# Patient Record
Sex: Male | Born: 1950 | Race: White | Hispanic: No | Marital: Married | Smoking: Former smoker
Health system: Southern US, Community
[De-identification: ages and names within clinical notes are randomized; demographics above are authoritative.]

## PROBLEM LIST (undated history)

## (undated) DIAGNOSIS — C73 Malignant neoplasm of thyroid gland: Secondary | ICD-10-CM

## (undated) DIAGNOSIS — I10 Essential (primary) hypertension: Secondary | ICD-10-CM

## (undated) DIAGNOSIS — E119 Type 2 diabetes mellitus without complications: Secondary | ICD-10-CM

## (undated) DIAGNOSIS — C801 Malignant (primary) neoplasm, unspecified: Secondary | ICD-10-CM

## (undated) HISTORY — DX: Malignant neoplasm of thyroid gland: C73

## (undated) HISTORY — PX: THYROID SURGERY: SHX805

## (undated) HISTORY — PX: TRANSURETHRAL RESECTION OF PROSTATE: SHX73

## (undated) HISTORY — PX: BACK SURGERY: SHX140

---

## 2012-02-27 ENCOUNTER — Other Ambulatory Visit (HOSPITAL_COMMUNITY): Payer: Self-pay | Admitting: "Endocrinology

## 2012-02-27 DIAGNOSIS — E049 Nontoxic goiter, unspecified: Secondary | ICD-10-CM

## 2012-03-02 ENCOUNTER — Ambulatory Visit (HOSPITAL_COMMUNITY)
Admission: RE | Admit: 2012-03-02 | Discharge: 2012-03-02 | Disposition: A | Discharge status: Home / Self Care | Payer: BC Managed Care – PPO | Source: Ambulatory Visit | Attending: "Endocrinology | Admitting: "Endocrinology

## 2012-03-02 DIAGNOSIS — E049 Nontoxic goiter, unspecified: Secondary | ICD-10-CM

## 2012-03-05 ENCOUNTER — Other Ambulatory Visit (HOSPITAL_COMMUNITY): Payer: Self-pay | Admitting: "Endocrinology

## 2012-03-05 DIAGNOSIS — E041 Nontoxic single thyroid nodule: Secondary | ICD-10-CM

## 2012-03-19 ENCOUNTER — Other Ambulatory Visit (HOSPITAL_COMMUNITY): Payer: Self-pay | Admitting: "Endocrinology

## 2012-03-19 ENCOUNTER — Ambulatory Visit (HOSPITAL_COMMUNITY)
Admission: RE | Admit: 2012-03-19 | Discharge: 2012-03-19 | Disposition: A | Discharge status: Home / Self Care | Payer: BC Managed Care – PPO | Source: Ambulatory Visit | Attending: "Endocrinology | Admitting: "Endocrinology

## 2012-03-19 DIAGNOSIS — E041 Nontoxic single thyroid nodule: Secondary | ICD-10-CM

## 2012-03-19 DIAGNOSIS — R6889 Other general symptoms and signs: Secondary | ICD-10-CM | POA: Insufficient documentation

## 2012-03-19 DIAGNOSIS — E049 Nontoxic goiter, unspecified: Secondary | ICD-10-CM | POA: Insufficient documentation

## 2012-03-19 NOTE — Progress Notes (Signed)
Lidocaine 2%          8mL injected                         Right thyroid biopsy performed

## 2012-07-08 ENCOUNTER — Other Ambulatory Visit (HOSPITAL_COMMUNITY): Payer: Self-pay | Admitting: "Endocrinology

## 2012-07-08 DIAGNOSIS — C73 Malignant neoplasm of thyroid gland: Secondary | ICD-10-CM

## 2012-07-15 ENCOUNTER — Encounter (HOSPITAL_COMMUNITY): Payer: Self-pay

## 2012-07-15 ENCOUNTER — Encounter (HOSPITAL_COMMUNITY)
Admission: RE | Admit: 2012-07-15 | Discharge: 2012-07-15 | Disposition: A | Discharge status: Home / Self Care | Payer: BC Managed Care – PPO | Source: Ambulatory Visit | Attending: "Endocrinology | Admitting: "Endocrinology

## 2012-07-15 DIAGNOSIS — C73 Malignant neoplasm of thyroid gland: Secondary | ICD-10-CM | POA: Insufficient documentation

## 2012-07-15 HISTORY — DX: Malignant (primary) neoplasm, unspecified: C80.1

## 2012-07-15 HISTORY — DX: Essential (primary) hypertension: I10

## 2012-07-15 HISTORY — DX: Type 2 diabetes mellitus without complications: E11.9

## 2012-07-15 MED ORDER — THYROTROPIN ALFA 1.1 MG IM SOLR
INTRAMUSCULAR | Status: AC
  Administered 2012-07-15: 1.1 mg via INTRAMUSCULAR
  Filled 2012-07-15: qty 0.9

## 2012-07-15 NOTE — Progress Notes (Signed)
Thyrogen 1.1mg /0.29mL IM injection given in RUOQ. Tolerated well.

## 2012-07-16 ENCOUNTER — Encounter (HOSPITAL_COMMUNITY)
Admission: RE | Admit: 2012-07-16 | Discharge: 2012-07-16 | Disposition: A | Discharge status: Home / Self Care | Payer: BC Managed Care – PPO | Source: Ambulatory Visit | Attending: "Endocrinology | Admitting: "Endocrinology

## 2012-07-16 MED ORDER — THYROTROPIN ALFA 1.1 MG IM SOLR
INTRAMUSCULAR | Status: AC
  Administered 2012-07-16: 1.1 mg via INTRAMUSCULAR
  Filled 2012-07-16: qty 0.9

## 2012-07-16 NOTE — Progress Notes (Signed)
Thyrogen 1.1mg /0.67mL IM injection given in LUOQ. Tolerated well.

## 2012-07-17 ENCOUNTER — Encounter (HOSPITAL_COMMUNITY)
Admission: RE | Admit: 2012-07-17 | Discharge: 2012-07-17 | Disposition: A | Discharge status: Home / Self Care | Payer: BC Managed Care – PPO | Source: Ambulatory Visit | Attending: "Endocrinology | Admitting: "Endocrinology

## 2012-07-17 MED ORDER — SODIUM IODIDE I 131 CAPSULE
100.0000 | Freq: Once | INTRAVENOUS | Status: AC | PRN
  Administered 2012-07-17: 106 via ORAL

## 2012-07-27 ENCOUNTER — Encounter (HOSPITAL_COMMUNITY)
Admission: RE | Admit: 2012-07-27 | Discharge: 2012-07-27 | Disposition: A | Discharge status: Home / Self Care | Payer: BC Managed Care – PPO | Source: Ambulatory Visit | Attending: "Endocrinology | Admitting: "Endocrinology

## 2012-07-27 ENCOUNTER — Encounter (HOSPITAL_COMMUNITY): Payer: Self-pay

## 2012-07-27 DIAGNOSIS — C73 Malignant neoplasm of thyroid gland: Secondary | ICD-10-CM

## 2012-12-28 ENCOUNTER — Other Ambulatory Visit (HOSPITAL_COMMUNITY): Payer: Self-pay | Admitting: "Endocrinology

## 2012-12-28 DIAGNOSIS — C73 Malignant neoplasm of thyroid gland: Secondary | ICD-10-CM

## 2012-12-30 ENCOUNTER — Ambulatory Visit (HOSPITAL_COMMUNITY): Payer: BC Managed Care – PPO

## 2012-12-31 ENCOUNTER — Ambulatory Visit (HOSPITAL_COMMUNITY)
Admission: RE | Admit: 2012-12-31 | Discharge: 2012-12-31 | Disposition: A | Discharge status: Home / Self Care | Payer: BC Managed Care – PPO | Source: Ambulatory Visit | Attending: "Endocrinology | Admitting: "Endocrinology

## 2012-12-31 DIAGNOSIS — C73 Malignant neoplasm of thyroid gland: Secondary | ICD-10-CM | POA: Insufficient documentation

## 2012-12-31 DIAGNOSIS — Z09 Encounter for follow-up examination after completed treatment for conditions other than malignant neoplasm: Secondary | ICD-10-CM | POA: Insufficient documentation

## 2013-08-10 ENCOUNTER — Other Ambulatory Visit (HOSPITAL_COMMUNITY): Payer: Self-pay | Admitting: "Endocrinology

## 2013-08-10 DIAGNOSIS — C73 Malignant neoplasm of thyroid gland: Secondary | ICD-10-CM

## 2013-10-19 MED ORDER — THYROTROPIN ALFA 1.1 MG IM SOLR: 0.9000 mg | INTRAMUSCULAR | Status: AC

## 2013-10-20 ENCOUNTER — Encounter (HOSPITAL_COMMUNITY)
Admission: RE | Admit: 2013-10-20 | Discharge: 2013-10-20 | Disposition: A | Discharge status: Home / Self Care | Payer: BC Managed Care – PPO | Source: Ambulatory Visit | Attending: "Endocrinology | Admitting: "Endocrinology

## 2013-10-20 ENCOUNTER — Encounter (HOSPITAL_COMMUNITY): Payer: Self-pay

## 2013-10-20 DIAGNOSIS — E0789 Other specified disorders of thyroid: Secondary | ICD-10-CM | POA: Insufficient documentation

## 2013-10-20 DIAGNOSIS — C73 Malignant neoplasm of thyroid gland: Secondary | ICD-10-CM | POA: Insufficient documentation

## 2013-10-20 MED ORDER — THYROTROPIN ALFA 1.1 MG IM SOLR
INTRAMUSCULAR | Status: AC
  Administered 2013-10-20: 0.9 mg via INTRAMUSCULAR
  Filled 2013-10-20: qty 0.9

## 2013-10-21 ENCOUNTER — Encounter (HOSPITAL_COMMUNITY)
Admission: RE | Admit: 2013-10-21 | Discharge: 2013-10-21 | Disposition: A | Discharge status: Home / Self Care | Payer: BC Managed Care – PPO | Source: Ambulatory Visit | Attending: "Endocrinology | Admitting: "Endocrinology

## 2013-10-21 DIAGNOSIS — C73 Malignant neoplasm of thyroid gland: Secondary | ICD-10-CM

## 2013-10-21 MED ORDER — THYROTROPIN ALFA 1.1 MG IM SOLR
0.9000 mg | INTRAMUSCULAR | Status: AC
  Administered 2013-10-21: 0.9 mg via INTRAMUSCULAR

## 2013-10-22 ENCOUNTER — Encounter (HOSPITAL_COMMUNITY)
Admission: RE | Admit: 2013-10-22 | Discharge: 2013-10-22 | Disposition: A | Discharge status: Home / Self Care | Payer: BC Managed Care – PPO | Source: Ambulatory Visit | Attending: "Endocrinology | Admitting: "Endocrinology

## 2013-10-22 ENCOUNTER — Encounter (HOSPITAL_COMMUNITY): Payer: Self-pay

## 2013-10-22 MED ORDER — SODIUM IODIDE I 131 CAPSULE
4.0000 | Freq: Once | INTRAVENOUS | Status: AC | PRN
  Administered 2013-10-22: 4 via ORAL

## 2013-10-25 ENCOUNTER — Encounter (HOSPITAL_COMMUNITY)
Admission: RE | Admit: 2013-10-25 | Discharge: 2013-10-25 | Disposition: A | Discharge status: Home / Self Care | Payer: BC Managed Care – PPO | Source: Ambulatory Visit | Attending: "Endocrinology | Admitting: "Endocrinology

## 2014-10-07 ENCOUNTER — Other Ambulatory Visit (HOSPITAL_COMMUNITY): Payer: Self-pay | Admitting: "Endocrinology

## 2014-10-07 DIAGNOSIS — C73 Malignant neoplasm of thyroid gland: Secondary | ICD-10-CM

## 2014-10-11 ENCOUNTER — Ambulatory Visit (HOSPITAL_COMMUNITY)
Admission: RE | Admit: 2014-10-11 | Discharge: 2014-10-11 | Disposition: A | Discharge status: Home / Self Care | Payer: BLUE CROSS/BLUE SHIELD | Source: Ambulatory Visit | Attending: "Endocrinology | Admitting: "Endocrinology

## 2014-10-11 DIAGNOSIS — Z85850 Personal history of malignant neoplasm of thyroid: Secondary | ICD-10-CM | POA: Insufficient documentation

## 2014-10-11 DIAGNOSIS — Z9889 Other specified postprocedural states: Secondary | ICD-10-CM | POA: Insufficient documentation

## 2014-10-11 DIAGNOSIS — C73 Malignant neoplasm of thyroid gland: Secondary | ICD-10-CM

## 2015-04-03 ENCOUNTER — Other Ambulatory Visit: Payer: Self-pay | Admitting: "Endocrinology

## 2015-04-03 DIAGNOSIS — C73 Malignant neoplasm of thyroid gland: Secondary | ICD-10-CM

## 2015-04-18 ENCOUNTER — Ambulatory Visit (HOSPITAL_COMMUNITY)
Admission: RE | Admit: 2015-04-18 | Discharge: 2015-04-18 | Disposition: A | Discharge status: Home / Self Care | Payer: BLUE CROSS/BLUE SHIELD | Source: Ambulatory Visit | Attending: "Endocrinology | Admitting: "Endocrinology

## 2015-04-18 DIAGNOSIS — R938 Abnormal findings on diagnostic imaging of other specified body structures: Secondary | ICD-10-CM | POA: Diagnosis not present

## 2015-04-18 DIAGNOSIS — C73 Malignant neoplasm of thyroid gland: Secondary | ICD-10-CM | POA: Diagnosis not present

## 2015-04-18 DIAGNOSIS — Z08 Encounter for follow-up examination after completed treatment for malignant neoplasm: Secondary | ICD-10-CM | POA: Insufficient documentation

## 2015-04-18 DIAGNOSIS — E890 Postprocedural hypothyroidism: Secondary | ICD-10-CM | POA: Insufficient documentation

## 2015-05-26 ENCOUNTER — Other Ambulatory Visit: Payer: Self-pay | Admitting: "Endocrinology

## 2015-05-30 ENCOUNTER — Ambulatory Visit (INDEPENDENT_AMBULATORY_CARE_PROVIDER_SITE_OTHER): Payer: BLUE CROSS/BLUE SHIELD | Admitting: "Endocrinology

## 2015-05-30 ENCOUNTER — Encounter: Payer: Self-pay | Admitting: "Endocrinology

## 2015-05-30 VITALS — BP 148/88 | HR 83 | Ht 68.0 in | Wt 201.0 lb

## 2015-05-30 DIAGNOSIS — E1165 Type 2 diabetes mellitus with hyperglycemia: Secondary | ICD-10-CM

## 2015-05-30 DIAGNOSIS — E118 Type 2 diabetes mellitus with unspecified complications: Secondary | ICD-10-CM

## 2015-05-30 DIAGNOSIS — E782 Mixed hyperlipidemia: Secondary | ICD-10-CM | POA: Insufficient documentation

## 2015-05-30 DIAGNOSIS — E785 Hyperlipidemia, unspecified: Secondary | ICD-10-CM | POA: Diagnosis not present

## 2015-05-30 DIAGNOSIS — I10 Essential (primary) hypertension: Secondary | ICD-10-CM | POA: Diagnosis not present

## 2015-05-30 DIAGNOSIS — C73 Malignant neoplasm of thyroid gland: Secondary | ICD-10-CM

## 2015-05-30 DIAGNOSIS — E890 Postprocedural hypothyroidism: Secondary | ICD-10-CM | POA: Diagnosis not present

## 2015-05-30 DIAGNOSIS — Z794 Long term (current) use of insulin: Secondary | ICD-10-CM | POA: Diagnosis not present

## 2015-05-30 DIAGNOSIS — IMO0002 Reserved for concepts with insufficient information to code with codable children: Secondary | ICD-10-CM | POA: Insufficient documentation

## 2015-05-30 MED ORDER — LEVOTHYROXINE SODIUM 150 MCG PO TABS: 150.0000 ug | ORAL_TABLET | Freq: Every day | ORAL | Status: DC

## 2015-05-30 MED ORDER — SAXAGLIPTIN HCL 5 MG PO TABS: 5.0000 mg | ORAL_TABLET | Freq: Every day | ORAL | Status: DC

## 2015-05-30 MED ORDER — INSULIN DETEMIR 100 UNIT/ML FLEXPEN: 40.0000 [IU] | PEN_INJECTOR | Freq: Every day | SUBCUTANEOUS | Status: DC

## 2015-05-30 MED ORDER — METFORMIN HCL 1000 MG PO TABS: 1000.0000 mg | ORAL_TABLET | Freq: Two times a day (BID) | ORAL | Status: DC

## 2015-05-30 NOTE — Progress Notes (Signed)
Subjective:    Patient ID: Dean Hurley, male    DOB: Sep 06, 1950,    Past Medical History  Diagnosis Date  . Cancer ([place])   . Hypertension   . Diabetes mellitus without complication [facility])    Past Surgical History  Procedure Laterality Date  . Transurethral resection of prostate    . Thyroid surgery      Thyroidectomy   Social History   Social History  . Marital Status: Married    Spouse Name: N/A  . Number of Children: N/A  . Years of Education: N/A   Social History Main Topics  . Smoking status: Former Research scientist (life sciences)  . Smokeless tobacco: Not on file  . Alcohol Use: No  . Drug Use: No  . Sexual Activity: Not on file   Other Topics Concern  . Not on file   Social History Narrative   Outpatient Encounter Prescriptions as of 05/30/2015  Medication Sig  . Insulin Detemir (LEVEMIR FLEXPEN) 100 UNIT/ML Pen Inject 40 Units into the skin daily at 10 pm.  . levothyroxine (SYNTHROID, LEVOTHROID) 150 MCG tablet Take 1 tablet (150 mcg total) by mouth daily before breakfast.  . meclizine (ANTIVERT) 25 MG tablet Take 25 mg by mouth 3 (three) times daily as needed for dizziness.  . metFORMIN (GLUCOPHAGE) 1000 MG tablet Take 1 tablet (1,000 mg total) by mouth 2 (two) times daily with a meal.  . pantoprazole (PROTONIX) 40 MG tablet Take 40 mg by mouth daily.  . rosuvastatin (CRESTOR) 5 MG tablet Take 5 mg by mouth daily.  . saxagliptin HCl (ONGLYZA) 5 MG TABS tablet Take 1 tablet (5 mg total) by mouth daily.  . vitamin B-12 (CYANOCOBALAMIN) 1000 MCG tablet Take 1,000 mcg by mouth daily.  . [DISCONTINUED] Insulin Detemir (LEVEMIR FLEXPEN) 100 UNIT/ML Pen Inject 30 Units into the skin daily at 10 pm.  . [DISCONTINUED] levothyroxine (SYNTHROID, LEVOTHROID) 150 MCG tablet Take 150 mcg by mouth daily before breakfast.  . [DISCONTINUED] metFORMIN (GLUCOPHAGE) 1000 MG tablet Take 1,000 mg by mouth 2 (two) times daily with a meal.  . [DISCONTINUED] ONGLYZA 5 MG TABS tablet TAKE 1 TABLET BY  MOUTH DAILY  . [DISCONTINUED] meloxicam (MOBIC) 15 MG tablet Take 15 mg by mouth daily.   No facility-administered encounter medications on file as of 05/30/2015.   ALLERGIES: Allergies  Allergen Reactions  . Codeine   . Contrast Media [Iodinated Diagnostic Agents]    VACCINATION STATUS:  There is no immunization history on file for this patient.  HPI   64 yr old male who underwent total thyroidectomy in [place] on June 02, 2012. 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 1.7 cm cancer nodule on the right lobe and 0.4 cm cancer nodule on the left lobe. Partial invasion of capsule, margins negative.He underwent thyrogen stimulated thyroid remnant ablation with negative whole body scan for distant metastasis on July 17, 2013.  His May 2014 thyroid /neck ultrasound was negative for any mass lesion.  His thyrogen WBS on 10/25/13 is c/w successful ablation of thyroid remnants with no evidence of local recurrence or metastatic disease. He has another thyroid ultrasound on 10/11/14 which was negative for residual thyroid tissue. On his most recent surveillance thyroid ultrasound on Sep 6,2016 however he has had a 9 mm residual tissue on the  right thyroid bed. He denies neck symptoms. He is here to f/u. He is currently on Synthroid 150 mcg po qam.   He feels better. no new complaints.  He is also being treated for type 2 DM , a1c remains high at 8.4% . He has not been compliant to his Levemir . he did not tolerate Invokana. he brings a log showing above target fasting BG profile. No new complaints.  Review of Systems  Constitutional: no weight gain/loss, no fatigue, no subjective hyperthermia/hypothermia Eyes: no blurry vision, no xerophthalmia ENT: no sore throat, no nodules palpated in throat, no dysphagia/odynophagia, no hoarseness Cardiovascular: no CP/SOB/palpitations/leg swelling Respiratory: no cough/SOB Gastrointestinal: no  N/V/D/C Musculoskeletal: no muscle/joint aches Skin: no rashes Neurological: no tremors/numbness/tingling/dizziness Psychiatric: no depression/anxiety   Objective:    BP 148/88 mmHg  Pulse 83  Ht '5\' 8"'  (1.727 m)  Wt 201 lb (91.173 kg)  BMI 30.57 kg/m2  SpO2 96%  Wt Readings from Last 3 Encounters:  05/30/15 201 lb (91.173 kg)    Physical Exam  Constitutional: overweight, in NAD Eyes: PERRLA, EOMI, no exophthalmos ENT: moist mucous membranes, thyroidectomy scar , no cervical lymphadenopathy Cardiovascular: RRR, No MRG Respiratory: CTA B Gastrointestinal: abdomen soft, NT, ND, BS+ Musculoskeletal: no deformities, strength intact in all 4 Skin: moist, warm, no rashes Neurological: no tremor with outstretched hands, DTR normal in all 4       Assessment & Plan:   1. Thyroid cancer [facility]):  Patient is s/p total thyroidectomy for stage 1 ( T1bNoMx) PTC , multicentric ( 1.7 cm on right lobe and 0.4cms in the left lobe). on July 17, 2012, thyrogen stimulated RAI thyroid remnant ablation given his BRAF positive presentation.  The post therapy scan was negative for distant metastasis. His May 2014 thyroid ultrasound is negative for remnant tissue.  Subsequent surveillance thyrogen stimulated WBS on 10/25/13 was c/w successful ablation of thyroid remnants, with no evidence of local recurrence nor metastatic disease.  His repeat thyroid ultrasound on 10/11/14 showed no residual thyroid tissue. However, on his last surveillance thyroid/neck ultrasound she was found to have a new 8m residual/recurrent soft tissue nodule in the right  thyroidectomy bed.  This could be a recurrent malignancy. We discussed what options to pursue. I will repeat neck/thyroid ultrasoung in 3 months . If the residual tissue continues to grow he will need biopsy to study further. He is a tissue disappears or stays the same will continue with scheduled Thyrogen whole-body scan in 6 -9 month time.  2.  Postsurgical hypothyroidism He is clinically euthyroid and thyroid function tests are  Acceptable. I will continue with levothyroxine 150 g by mouth every morning.  We discussed about correct intake of levothyroxine, at fasting, with water, separated by at least 30 minutes from breakfast, and separated by more than 4 hours from calcium, iron, multivitamins, acid reflux medications (PPIs). -Patient is made aware of the fact that thyroid hormone replacement is needed for life, dose to be adjusted by periodic monitoring of thyroid function tests.  - TSH - T4, free  3. Uncontrolled type 2 diabetes mellitus with complication, with long-term current use of insulin ([place] He did not comply with diabetes therapy , a1c remains high at 8.4%. I advised him to increase Levemir to 40 units qhs, continue on metformin 10042mpo BID, and Onglyza 8m79mo qday.   - Hemoglobin [id number] metabolic panel  4. Hyperlipidemia He is on crestor 5 mg po qhs, LDL is high at 126.  5. Essential hypertension, benign He will continue on Lisinopril 20 mg po qday.    I advised patient to maintain close follow up with their PCP for primary care  needs. Follow up plan: Return in about 3 months (around 08/30/2015) for thyroid cancer,  diabetes, underactive thyroid, follow up with pre-visit labs, meter, and logs,.  Glade Lloyd, MD Phone: [phone]  Fax: [phone]   05/30/2015, 11:19 AM

## 2015-05-30 NOTE — Patient Instructions (Signed)
Advice for weight management -For most of us the best way to lose weight is by diet management. Generally speaking, diet management means restricting carbohydrate consumption to minimum possible (and to unprocessed or minimally processed complex starch) and increasing protein intake (animal or plant source), fruits, and vegetables.  -Sticking to a routine mealtime to eat 3 meals a day and avoiding unnecessary snacks is shown to have a big role in weight control.  -It is better to avoid simple carbohydrates including: Cakes, Desserts, Ice Cream, Soda (diet and regular), Sweet Tea, Candies, Chips, Cookies, Artificial Sweeteners, and "Sugar-free" Products.   -Exercise: 30 minutes a day 3-4 days a week, or 150 minutes a week. Combine stretch, strength, and aerobic activities. You may seek evaluation by your heart doctor prior to initiating exercise if you have high risk for heart disease.  -If you are interested, we can schedule a visit with Dean Hurley, RDN, CDE for individualized nutrition education.  

## 2015-06-22 ENCOUNTER — Encounter: Payer: Self-pay | Admitting: "Endocrinology

## 2015-06-23 ENCOUNTER — Other Ambulatory Visit: Payer: Self-pay | Admitting: "Endocrinology

## 2015-06-27 ENCOUNTER — Other Ambulatory Visit: Payer: Self-pay | Admitting: "Endocrinology

## 2015-06-27 DIAGNOSIS — C73 Malignant neoplasm of thyroid gland: Secondary | ICD-10-CM

## 2015-07-26 ENCOUNTER — Ambulatory Visit (HOSPITAL_COMMUNITY)
Admission: RE | Admit: 2015-07-26 | Discharge: 2015-07-26 | Disposition: A | Discharge status: Home / Self Care | Payer: BLUE CROSS/BLUE SHIELD | Source: Ambulatory Visit | Attending: "Endocrinology | Admitting: "Endocrinology

## 2015-07-26 DIAGNOSIS — C73 Malignant neoplasm of thyroid gland: Secondary | ICD-10-CM

## 2015-07-26 DIAGNOSIS — E041 Nontoxic single thyroid nodule: Secondary | ICD-10-CM | POA: Diagnosis not present

## 2015-07-27 ENCOUNTER — Telehealth: Payer: Self-pay

## 2015-07-27 NOTE — Telephone Encounter (Signed)
Pt called wanting to see if Dr Dorris Fetch would call him concerning his ultrasound results

## 2015-07-28 ENCOUNTER — Other Ambulatory Visit: Payer: Self-pay | Admitting: "Endocrinology

## 2015-07-28 DIAGNOSIS — C73 Malignant neoplasm of thyroid gland: Secondary | ICD-10-CM

## 2015-07-31 ENCOUNTER — Telehealth: Payer: Self-pay

## 2015-07-31 NOTE — Telephone Encounter (Signed)
Pt notified that we have sent aa appointment request to [facility] Scheduling.

## 2015-07-31 NOTE — Telephone Encounter (Signed)
[facility] Scheduling to get [facility] scheduled for pt. Had to leave voice mail for them to call me back.

## 2015-08-02 NOTE — Telephone Encounter (Signed)
Pt has appt on 08/04/15 @ 8:00 at [facility]. He has been notified.

## 2015-08-15 NOTE — Telephone Encounter (Signed)
[facility] system did not post any results from his recent thyroid studies yet. It may be because it still in process, less gives them a few days.

## 2015-08-18 NOTE — Telephone Encounter (Signed)
We have results back this morning unfortunately nondiagnostic specimen composed of peripheral blood elements. I spoke with Dean Hurley and discussed the findings. The next thing to do would be for him to make a phone call and schedule a visit with Dr. Brynda Rim in [facility] for next steps. This is discussed with him. He may need either excisional biopsy or another attempt for fine needle aspiration.

## 2015-08-18 NOTE — Telephone Encounter (Signed)
Several attempts have been made to get pathology results from [facility]. They state that they results are ready but we cannot get results sent to [place].

## 2015-08-28 ENCOUNTER — Encounter: Payer: Self-pay | Admitting: "Endocrinology

## 2015-08-29 NOTE — Telephone Encounter (Signed)
Pt has been referred to Dr Brynda Rim [facility])

## 2015-09-01 ENCOUNTER — Ambulatory Visit: Payer: BLUE CROSS/BLUE SHIELD | Admitting: "Endocrinology

## 2015-10-08 ENCOUNTER — Other Ambulatory Visit: Payer: Self-pay | Admitting: "Endocrinology

## 2015-10-27 ENCOUNTER — Encounter: Payer: Self-pay | Admitting: "Endocrinology

## 2015-10-30 ENCOUNTER — Ambulatory Visit (INDEPENDENT_AMBULATORY_CARE_PROVIDER_SITE_OTHER): Payer: BLUE CROSS/BLUE SHIELD | Admitting: "Endocrinology

## 2015-10-30 ENCOUNTER — Encounter: Payer: Self-pay | Admitting: "Endocrinology

## 2015-10-30 VITALS — BP 118/72 | HR 74 | Ht 68.0 in | Wt 191.0 lb

## 2015-10-30 DIAGNOSIS — C73 Malignant neoplasm of thyroid gland: Secondary | ICD-10-CM | POA: Diagnosis not present

## 2015-10-30 DIAGNOSIS — Z794 Long term (current) use of insulin: Secondary | ICD-10-CM

## 2015-10-30 DIAGNOSIS — E118 Type 2 diabetes mellitus with unspecified complications: Secondary | ICD-10-CM | POA: Diagnosis not present

## 2015-10-30 DIAGNOSIS — E890 Postprocedural hypothyroidism: Secondary | ICD-10-CM | POA: Diagnosis not present

## 2015-10-30 DIAGNOSIS — E1165 Type 2 diabetes mellitus with hyperglycemia: Secondary | ICD-10-CM

## 2015-10-30 DIAGNOSIS — I10 Essential (primary) hypertension: Secondary | ICD-10-CM

## 2015-10-30 DIAGNOSIS — IMO0002 Reserved for concepts with insufficient information to code with codable children: Secondary | ICD-10-CM

## 2015-10-30 DIAGNOSIS — E785 Hyperlipidemia, unspecified: Secondary | ICD-10-CM

## 2015-10-30 MED ORDER — METFORMIN HCL 1000 MG PO TABS: 1000.0000 mg | ORAL_TABLET | Freq: Two times a day (BID) | ORAL | Status: DC

## 2015-10-30 MED ORDER — INSULIN DETEMIR 100 UNIT/ML FLEXPEN: 30.0000 [IU] | PEN_INJECTOR | Freq: Every day | SUBCUTANEOUS | Status: DC

## 2015-10-30 MED ORDER — LINAGLIPTIN 5 MG PO TABS: 5.0000 mg | ORAL_TABLET | Freq: Every day | ORAL | Status: DC

## 2015-10-30 MED ORDER — LEVOTHYROXINE SODIUM 137 MCG PO TABS: 137.0000 ug | ORAL_TABLET | Freq: Every day | ORAL | Status: DC

## 2015-10-30 MED ORDER — INSULIN PEN NEEDLE 31G X 8 MM MISC: 1.0000 | Status: DC

## 2015-10-30 NOTE — Patient Instructions (Signed)

## 2015-10-30 NOTE — Progress Notes (Signed)
Subjective:    Patient ID: Dean Hurley, male    DOB: 1950/10/01,    Past Medical History  Diagnosis Date  . Cancer ([place])   . Hypertension   . Diabetes mellitus without complication [facility])    Past Surgical History  Procedure Laterality Date  . Transurethral resection of prostate    . Thyroid surgery      Thyroidectomy   Social History   Social History  . Marital Status: Married    Spouse Name: N/A  . Number of Children: N/A  . Years of Education: N/A   Social History Main Topics  . Smoking status: Former Research scientist (life sciences)  . Smokeless tobacco: None  . Alcohol Use: No  . Drug Use: No  . Sexual Activity: Not Asked   Other Topics Concern  . None   Social History Narrative   Outpatient Encounter Prescriptions as of 10/30/2015  Medication Sig  . Insulin Detemir (LEVEMIR) 100 UNIT/ML Pen Inject 30 Units into the skin daily at 10 pm.  . [DISCONTINUED] Insulin Detemir (LEVEMIR) 100 UNIT/ML Pen Inject 30 Units into the skin daily at 10 pm.  . Insulin Pen Needle (B-D ULTRAFINE III SHORT PEN) 31G X 8 MM MISC 1 each by Does not apply route as directed.  Marland Kitchen levothyroxine (SYNTHROID, LEVOTHROID) 137 MCG tablet Take 1 tablet (137 mcg total) by mouth daily before breakfast.  . linagliptin (TRADJENTA) 5 MG TABS tablet Take 1 tablet (5 mg total) by mouth daily.  Marland Kitchen lisinopril (PRINIVIL,ZESTRIL) 20 MG tablet TAKE 1 TABLET BY MOUTH EVERY DAY  . meclizine (ANTIVERT) 25 MG tablet Take 25 mg by mouth 3 (three) times daily as needed for dizziness.  . metFORMIN (GLUCOPHAGE) 1000 MG tablet Take 1 tablet (1,000 mg total) by mouth 2 (two) times daily with a meal.  . pantoprazole (PROTONIX) 40 MG tablet Take 40 mg by mouth daily.  . rosuvastatin (CRESTOR) 5 MG tablet TAKE 1 TABLET BY MOUTH AT BEDTIME  . vitamin B-12 (CYANOCOBALAMIN) 1000 MCG tablet Take 1,000 mcg by mouth daily.  . [DISCONTINUED] LEVEMIR FLEXTOUCH 100 UNIT/ML Pen INJECT 40 UNITS INTO THE SKIN DAILY AT 10PM  . [DISCONTINUED]  levothyroxine (SYNTHROID, LEVOTHROID) 150 MCG tablet Take 1 tablet (150 mcg total) by mouth daily before breakfast.  . [DISCONTINUED] metFORMIN (GLUCOPHAGE) 1000 MG tablet Take 1 tablet (1,000 mg total) by mouth 2 (two) times daily with a meal.  . [DISCONTINUED] saxagliptin HCl (ONGLYZA) 5 MG TABS tablet Take 1 tablet (5 mg total) by mouth daily.   No facility-administered encounter medications on file as of 10/30/2015.   ALLERGIES: Allergies  Allergen Reactions  . Codeine   . Contrast Media [Iodinated Diagnostic Agents]    VACCINATION STATUS:  There is no immunization history on file for this patient.  HPI   65 yr old male who underwent total thyroidectomy in [employer] on June 02, 2012. 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 1.7 cm cancer nodule on the right lobe and 0.4 cm cancer nodule on the left lobe. Partial invasion of capsule, margins negative.He underwent thyrogen stimulated thyroid remnant ablation with negative whole body scan for distant metastasis on July 17, 2013.  His May 2014 thyroid /neck ultrasound was negative for any mass lesion.  His thyrogen WBS on 10/25/13 is c/w successful ablation of thyroid remnants with no evidence of local recurrence or metastatic disease. He has another thyroid ultrasound on 10/11/14 which was negative for residual thyroid tissue. On his most recent surveillance thyroid ultrasound  on Sep 6,2016 however he has had a 9 mm residual tissue on the  right thyroid bed, Which grew to 12 mm in follow-up ultrasound in December 2016. [facility] was [facility] where he had his prior surgery. Fine needle  aspiration of the residual tissue did not reveal diagnostic material. He denies neck symptoms. He is here to f/u. He is currently on Synthroid 150 mcg po qam.   He feels better. no new complaints.   He is also being treated for type 2 DM , last a1c remains high at 8.4% . He has not been compliant to his Levemir . He is also on  metformin and Onglyza, came with near target fasting BG profile. No new complaints.  Review of Systems  Constitutional: no weight gain/loss, no fatigue, no subjective hyperthermia/hypothermia Eyes: no blurry vision, no xerophthalmia ENT: no sore throat, no nodules palpated in throat, no dysphagia/odynophagia, no hoarseness Cardiovascular: no CP/SOB/palpitations/leg swelling Respiratory: no cough/SOB Gastrointestinal: no N/V/D/C Musculoskeletal: no muscle/joint aches Skin: no rashes Neurological: no tremors/numbness/tingling/dizziness Psychiatric: no depression/anxiety   Objective:    BP 118/72 mmHg  Pulse 74  Ht '5\' 8"'  (1.727 m)  Wt 191 lb (86.637 kg)  BMI 29.05 kg/m2  SpO2 96%  Wt Readings from Last 3 Encounters:  10/30/15 191 lb (86.637 kg)  05/30/15 201 lb (91.173 kg)    Physical Exam  Constitutional: overweight, in NAD Eyes: PERRLA, EOMI, no exophthalmos ENT: moist mucous membranes, thyroidectomy scar , no cervical lymphadenopathy Cardiovascular: RRR, No MRG Respiratory: CTA B Gastrointestinal: abdomen soft, NT, ND, BS+ Musculoskeletal: no deformities, strength intact in all 4 Skin: moist, warm, no rashes Neurological: no tremor with outstretched hands, DTR normal in all 4       Assessment & Plan:   1. Thyroid cancer [facility]):  Patient is s/p total thyroidectomy for stage 1 ( T1bNoMx) PTC , multicentric ( 1.7 cm on right lobe and 0.4cms in the left lobe). on July 17, 2012, thyrogen stimulated RAI thyroid remnant ablation given his BRAF positive presentation.  The post therapy scan was negative for distant metastasis. His May 2014 thyroid ultrasound is negative for remnant tissue.  Subsequent surveillance thyrogen stimulated WBS on 10/25/13 was c/w successful ablation of thyroid remnants, with no evidence of local recurrence nor metastatic disease.  His repeat thyroid ultrasound on 10/11/14 showed no residual thyroid tissue. However, on his last surveillance  thyroid/neck ultrasound she was found to have a new 62m residual/recurrent soft tissue nodule in the right  thyroidectomy bed. On subsequent surveillance thyroid ultrasound this tissue was found to be larger at 12 mm. Attempt to biopsy this up to [facility] was not diagnostic.  This could be a recurrent malignancy. We discussed what options to pursue.  I will repeat neck/thyroid ultrasoung in 3 months . If the residual tissue continues to grow he will need repeat attempt on biopsy to study further. If this tissue disappears or stays the same will continue with scheduled Thyrogen whole-body scan in 6 -9 month time.  2. Postsurgical hypothyroidism -His thyroid function tests are consistent with over replacement with thyroid hormone. I will lower his levothyroxine to 137 g by mouth every morning.   We discussed about correct intake of levothyroxine, at fasting, with water, separated by at least 30 minutes from breakfast, and separated by more than 4 hours from calcium, iron, multivitamins, acid reflux medications (PPIs). -Patient is made aware of the fact that thyroid hormone replacement is needed for life, dose to be adjusted by  periodic monitoring of thyroid function tests.  3. Uncontrolled type 2 diabetes mellitus with complication, with long-term current use of insulin ([place]) In the past, he did not comply with diabetes therapy , a1c remains high at 8.4%. I advised him to decrease Levemir to 30 units qhs, continue on metformin 102m po BID, and Onglyza 522mpo qday.    4. Hyperlipidemia He is on crestor 5 mg po qhs, LDL is high at 126.  5. Essential hypertension, benign He will continue on Lisinopril 20 mg po qday.    I advised patient to maintain close follow up with their PCP for primary care needs. Follow up plan: Return in about 3 months (around 01/30/2016) for diabetes, high blood pressure, high cholesterol, underactive thyroid, follow up with pre-visit labs, meter, and logs, Thyroid  Ultrasound.  GeGlade LloydMD Phone: [phone]: [phone] 10/30/2015, 1:41 PM

## 2015-11-27 ENCOUNTER — Telehealth: Payer: Self-pay

## 2015-11-27 NOTE — Telephone Encounter (Signed)
Pharmacy states that [place] is no longer covered by the pts insurance. [facility] is preferred.

## 2015-11-27 NOTE — Telephone Encounter (Signed)
Proceed with Januvia 25 mg by mouth today.

## 2015-11-28 MED ORDER — SITAGLIPTIN PHOSPHATE 25 MG PO TABS: 25.0000 mg | ORAL_TABLET | Freq: Every day | ORAL | Status: DC

## 2015-12-10 ENCOUNTER — Other Ambulatory Visit: Payer: Self-pay | Admitting: "Endocrinology

## 2016-01-26 ENCOUNTER — Other Ambulatory Visit (HOSPITAL_COMMUNITY): Payer: BLUE CROSS/BLUE SHIELD

## 2016-01-29 LAB — HEMOGLOBIN A1C: Hemoglobin A1C: 7.9

## 2016-02-01 ENCOUNTER — Ambulatory Visit (INDEPENDENT_AMBULATORY_CARE_PROVIDER_SITE_OTHER): Payer: BLUE CROSS/BLUE SHIELD | Admitting: "Endocrinology

## 2016-02-01 ENCOUNTER — Ambulatory Visit (HOSPITAL_COMMUNITY)
Admission: RE | Admit: 2016-02-01 | Discharge: 2016-02-01 | Disposition: A | Discharge status: Home / Self Care | Payer: BLUE CROSS/BLUE SHIELD | Source: Ambulatory Visit | Attending: "Endocrinology | Admitting: "Endocrinology

## 2016-02-01 ENCOUNTER — Encounter: Payer: Self-pay | Admitting: "Endocrinology

## 2016-02-01 VITALS — BP 112/75 | HR 76 | Ht 68.0 in | Wt 188.0 lb

## 2016-02-01 DIAGNOSIS — E118 Type 2 diabetes mellitus with unspecified complications: Secondary | ICD-10-CM | POA: Diagnosis not present

## 2016-02-01 DIAGNOSIS — Z9889 Other specified postprocedural states: Secondary | ICD-10-CM | POA: Insufficient documentation

## 2016-02-01 DIAGNOSIS — C73 Malignant neoplasm of thyroid gland: Secondary | ICD-10-CM | POA: Diagnosis not present

## 2016-02-01 DIAGNOSIS — Z794 Long term (current) use of insulin: Secondary | ICD-10-CM

## 2016-02-01 DIAGNOSIS — E1165 Type 2 diabetes mellitus with hyperglycemia: Secondary | ICD-10-CM | POA: Diagnosis not present

## 2016-02-01 DIAGNOSIS — I10 Essential (primary) hypertension: Secondary | ICD-10-CM | POA: Diagnosis not present

## 2016-02-01 DIAGNOSIS — E890 Postprocedural hypothyroidism: Secondary | ICD-10-CM

## 2016-02-01 DIAGNOSIS — E785 Hyperlipidemia, unspecified: Secondary | ICD-10-CM | POA: Diagnosis not present

## 2016-02-01 DIAGNOSIS — IMO0002 Reserved for concepts with insufficient information to code with codable children: Secondary | ICD-10-CM

## 2016-02-01 NOTE — Progress Notes (Signed)
Subjective:    Patient ID: Dean Hurley, male    DOB: August 10, 1951,    Past Medical History  Diagnosis Date  . Cancer ([place])   . Hypertension   . Diabetes mellitus without complication [facility])    Past Surgical History  Procedure Laterality Date  . Transurethral resection of prostate    . Thyroid surgery      Thyroidectomy   Social History   Social History  . Marital Status: Married    Spouse Name: N/A  . Number of Children: N/A  . Years of Education: N/A   Social History Main Topics  . Smoking status: Former Research scientist (life sciences)  . Smokeless tobacco: None  . Alcohol Use: No  . Drug Use: No  . Sexual Activity: Not Asked   Other Topics Concern  . None   Social History Narrative   Outpatient Encounter Prescriptions as of 02/01/2016  Medication Sig  . saxagliptin HCl (ONGLYZA) 5 MG TABS tablet Take by mouth daily.  . Insulin Detemir (LEVEMIR) 100 UNIT/ML Pen Inject 30 Units into the skin daily at 10 pm.  . Insulin Pen Needle (B-D ULTRAFINE III SHORT PEN) 31G X 8 MM MISC 1 each by Does not apply route as directed.  Marland Kitchen levothyroxine (SYNTHROID, LEVOTHROID) 137 MCG tablet Take 1 tablet (137 mcg total) by mouth daily before breakfast.  . linagliptin (TRADJENTA) 5 MG TABS tablet Take 1 tablet (5 mg total) by mouth daily.  Marland Kitchen lisinopril (PRINIVIL,ZESTRIL) 20 MG tablet TAKE 1 TABLET BY MOUTH EVERY DAY  . meclizine (ANTIVERT) 25 MG tablet Take 25 mg by mouth 3 (three) times daily as needed for dizziness.  . metFORMIN (GLUCOPHAGE) 1000 MG tablet Take 1 tablet (1,000 mg total) by mouth 2 (two) times daily with a meal.  . rosuvastatin (CRESTOR) 5 MG tablet TAKE 1 TABLET BY MOUTH AT BEDTIME  . sitaGLIPtin (JANUVIA) 25 MG tablet Take 1 tablet (25 mg total) by mouth daily. (Patient not taking: Reported on 02/01/2016)  . [DISCONTINUED] pantoprazole (PROTONIX) 40 MG tablet Take 40 mg by mouth daily.  . [DISCONTINUED] vitamin B-12 (CYANOCOBALAMIN) 1000 MCG tablet Take 1,000 mcg by mouth daily.   No  facility-administered encounter medications on file as of 02/01/2016.   ALLERGIES: Allergies  Allergen Reactions  . Codeine   . Contrast Media [Iodinated Diagnostic Agents]    VACCINATION STATUS:  There is no immunization history on file for this patient.  HPI   65 yr old male who underwent total thyroidectomy in [place] on June 02, 2012. 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 1.7 cm cancer nodule on the right lobe and 0.4 cm cancer nodule on the left lobe. Partial invasion of capsule, margins negative. He underwent thyrogen stimulated thyroid remnant ablation with negative whole body scan for distant metastasis on July 17, 2013.  His May 2014 thyroid /neck ultrasound was negative for any mass lesion.  His thyrogen WBS on 10/25/13 is c/w successful ablation of thyroid remnants with no evidence of local recurrence or metastatic disease. He has  Had another thyroid ultrasound on 10/11/14 which was negative for residual thyroid tissue. On his most recent surveillance thyroid ultrasound on September 6,2016 however he has had a 9 mm residual tissue on the  right thyroid bed, Which grew to 12 mm in follow-up ultrasound in December 2016. He was sent to [facility] where he had his prior surgery. Fine needle  aspiration of the residual tissue did not reveal diagnostic material.  - On thyroid/neck ultrasound  before this visit on 02/01/2016 showed that the remnant thyroid tissue decreased in size to 1.0 cm, all other  findings unchanged. He denies neck symptoms. He is here to f/u. He is currently on Synthroid 137 mcg po qam.   He feels better. no new complaints.   He is also being treated for type 2 DM , last a1c has improved to 7.9% from 8.4% .  He has been more compliant to his Levemir ,  Metformin, and onglyza . He came with near target fasting BG profile. No new complaints.  Review of Systems  Constitutional: no weight gain/loss, no fatigue, no subjective  hyperthermia/hypothermia Eyes: no blurry vision, no xerophthalmia ENT: no sore throat, no nodules palpated in throat, no dysphagia/odynophagia, no hoarseness Cardiovascular: no CP/SOB/palpitations/leg swelling Respiratory: no cough/SOB Gastrointestinal: no N/V/D/C Musculoskeletal: no muscle/joint aches Skin: no rashes Neurological: no tremors/numbness/tingling/dizziness Psychiatric: no depression/anxiety   Objective:    BP 112/75 mmHg  Pulse 76  Ht _0  (1.727 m)  Wt 188 lb (85.276 kg)  BMI 28.59 kg/m2  Wt Readings from Last 3 Encounters:  02/01/16 188 lb (85.276 kg)  10/30/15 191 lb (86.637 kg)  05/30/15 201 lb (91.173 kg)    Physical Exam  Constitutional: overweight, in NAD Eyes: PERRLA, EOMI, no exophthalmos ENT: moist mucous membranes, thyroidectomy scar , no cervical lymphadenopathy Cardiovascular: RRR, No MRG Respiratory: CTA B Gastrointestinal: abdomen soft, NT, ND, BS+ Musculoskeletal: no deformities, strength intact in all 4 Skin: moist, warm, no rashes Neurological: no tremor with outstretched hands, DTR normal in all 4    Assessment & Plan:   1. Thyroid cancer [facility]):  Patient is s/p total thyroidectomy for stage 1 ( T1bNoMx) PTC , multicentric ( 1.7 cm on right lobe and 0.4cms in the left lobe). on July 17, 2012, thyrogen stimulated RAI thyroid remnant ablation given his BRAF positive presentation.  The post therapy scan was negative for distant metastasis. His May 2014 thyroid ultrasound is negative for remnant tissue.  Subsequent surveillance thyrogen stimulated WBS on 10/25/13 was c/w successful ablation of thyroid remnants, with no evidence of local recurrence nor metastatic disease.  His repeat thyroid ultrasound on 10/11/14 showed no residual thyroid tissue. However, on his last surveillance thyroid/neck ultrasound she was found to have a new 45m residual/recurrent soft tissue nodule in the right  thyroidectomy bed. On subsequent surveillance  thyroid ultrasound this tissue was found to be larger at 12 mm. Attempt to biopsy this up to [facility] was not diagnostic.   - On thyroid/neck ultrasound before this visit on 02/01/2016 showed that the remnant thyroid tissue decreased in size to 1.0 cm, all other  findings unchanged. This could be a recurrent malignancy. There was no sign of residual thyroid tissue on whole-body scan performed 2015. - We had a long discussion about options.  -I will arrange for Thyrogen stimulated  whole-body scan in 4 months.  2. Postsurgical hypothyroidism -His thyroid function tests are consistent with over replacement with thyroid hormone. I will lower his levothyroxine to 137 g by mouth every morning.   We discussed about correct intake of levothyroxine, at fasting, with water, separated by at least 30 minutes from breakfast, and separated by more than 4 hours from calcium, iron, multivitamins, acid reflux medications (PPIs). -Patient is made aware of the fact that thyroid hormone replacement is needed for life, dose to be adjusted by periodic monitoring of thyroid function tests.  3. Uncontrolled type 2 diabetes mellitus with complication, with long-term current use of insulin (  [place]) he did  comply better with diabetes therapy , a1c  improved to 7.9% from  8.4%. I advised him to continue Levemir 30 units qhs, continue on metformin 1015m po BID, and Tradjenta 530mpo qday.    4. Hyperlipidemia He is on crestor 5 mg po qhs, LDL is high at 126.  5. Essential hypertension, benign He will continue on Lisinopril 20 mg po qday.    I advised patient to maintain close follow up with their PCP for primary care needs. Follow up plan: Return in about 5 months (around 07/03/2016) for meter, and logs, Whole Body Scan w/Thyrogen.  GeGlade LloydMD Phone: [phone]: [phone] 02/01/2016, 2:07 PM

## 2016-02-01 NOTE — Patient Instructions (Signed)

## 2016-02-08 ENCOUNTER — Encounter: Payer: Self-pay | Admitting: "Endocrinology

## 2016-02-21 ENCOUNTER — Other Ambulatory Visit: Payer: Self-pay | Admitting: "Endocrinology

## 2016-03-21 ENCOUNTER — Other Ambulatory Visit: Payer: Self-pay | Admitting: "Endocrinology

## 2016-04-10 ENCOUNTER — Other Ambulatory Visit: Payer: Self-pay | Admitting: "Endocrinology

## 2016-05-09 ENCOUNTER — Other Ambulatory Visit: Payer: Self-pay | Admitting: "Endocrinology

## 2016-05-13 ENCOUNTER — Other Ambulatory Visit: Payer: Self-pay | Admitting: "Endocrinology

## 2016-05-27 ENCOUNTER — Other Ambulatory Visit: Payer: Self-pay

## 2016-05-27 MED ORDER — LEVOTHYROXINE SODIUM 137 MCG PO TABS: ORAL_TABLET | ORAL | 0 refills | Status: DC

## 2016-05-27 MED ORDER — LISINOPRIL 20 MG PO TABS
20.0000 mg | ORAL_TABLET | Freq: Every day | ORAL | 0 refills | Status: DC
Start: 2016-05-27 — End: 2016-09-29

## 2016-05-27 MED ORDER — SITAGLIPTIN PHOSPHATE 25 MG PO TABS: 25.0000 mg | ORAL_TABLET | Freq: Every day | ORAL | 0 refills | Status: DC

## 2016-05-27 MED ORDER — INSULIN DETEMIR 100 UNIT/ML FLEXPEN: PEN_INJECTOR | SUBCUTANEOUS | 0 refills | Status: DC

## 2016-05-27 MED ORDER — ROSUVASTATIN CALCIUM 5 MG PO TABS: 5.0000 mg | ORAL_TABLET | Freq: Every day | ORAL | 0 refills | Status: DC

## 2016-05-28 ENCOUNTER — Other Ambulatory Visit: Payer: Self-pay

## 2016-05-28 MED ORDER — INSULIN DETEMIR 100 UNIT/ML FLEXPEN: PEN_INJECTOR | SUBCUTANEOUS | 0 refills | Status: DC

## 2016-06-04 ENCOUNTER — Other Ambulatory Visit: Payer: Self-pay | Admitting: "Endocrinology

## 2016-06-04 DIAGNOSIS — C73 Malignant neoplasm of thyroid gland: Secondary | ICD-10-CM

## 2016-06-09 ENCOUNTER — Other Ambulatory Visit: Payer: Self-pay | Admitting: "Endocrinology

## 2016-06-14 ENCOUNTER — Encounter: Payer: Self-pay | Admitting: "Endocrinology

## 2016-06-17 ENCOUNTER — Encounter (HOSPITAL_COMMUNITY): Payer: Self-pay

## 2016-06-17 ENCOUNTER — Encounter (HOSPITAL_COMMUNITY)
Admission: RE | Admit: 2016-06-17 | Discharge: 2016-06-17 | Disposition: A | Discharge status: Home / Self Care | Payer: BLUE CROSS/BLUE SHIELD | Source: Ambulatory Visit | Attending: "Endocrinology | Admitting: "Endocrinology

## 2016-06-17 DIAGNOSIS — C73 Malignant neoplasm of thyroid gland: Secondary | ICD-10-CM | POA: Insufficient documentation

## 2016-06-17 MED ORDER — STERILE WATER FOR INJECTION IJ SOLN
INTRAMUSCULAR | Status: AC
  Administered 2016-06-17: 1 mL
  Filled 2016-06-17: qty 10

## 2016-06-17 MED ORDER — THYROTROPIN ALFA 1.1 MG IM SOLR
0.9000 mg | INTRAMUSCULAR | Status: AC
  Administered 2016-06-17: 0.9 mg via INTRAMUSCULAR

## 2016-06-17 MED ORDER — THYROTROPIN ALFA 1.1 MG IM SOLR
INTRAMUSCULAR | Status: AC
Start: 2016-06-17 — End: 2016-06-17
  Administered 2016-06-17: 0.9 mg via INTRAMUSCULAR
  Filled 2016-06-17: qty 0.9

## 2016-06-18 ENCOUNTER — Encounter (HOSPITAL_COMMUNITY)
Admission: RE | Admit: 2016-06-18 | Discharge: 2016-06-18 | Disposition: A | Discharge status: Home / Self Care | Payer: BLUE CROSS/BLUE SHIELD | Source: Ambulatory Visit | Attending: "Endocrinology | Admitting: "Endocrinology

## 2016-06-18 DIAGNOSIS — C73 Malignant neoplasm of thyroid gland: Secondary | ICD-10-CM

## 2016-06-18 MED ORDER — THYROTROPIN ALFA 1.1 MG IM SOLR
INTRAMUSCULAR | Status: AC
  Administered 2016-06-18: 0.9 mg via INTRAMUSCULAR
  Filled 2016-06-18: qty 0.9

## 2016-06-18 MED ORDER — THYROTROPIN ALFA 1.1 MG IM SOLR
0.9000 mg | INTRAMUSCULAR | Status: AC
Start: 2016-06-18 — End: 2016-06-18
  Administered 2016-06-18: 0.9 mg via INTRAMUSCULAR

## 2016-06-18 MED ORDER — STERILE WATER FOR INJECTION IJ SOLN
INTRAMUSCULAR | Status: AC
  Filled 2016-06-18: qty 10

## 2016-06-19 ENCOUNTER — Encounter (HOSPITAL_COMMUNITY)
Admission: RE | Admit: 2016-06-19 | Discharge: 2016-06-19 | Disposition: A | Discharge status: Home / Self Care | Payer: BLUE CROSS/BLUE SHIELD | Source: Ambulatory Visit | Attending: "Endocrinology | Admitting: "Endocrinology

## 2016-06-19 MED ORDER — SODIUM IODIDE I 131 CAPSULE
4.0000 | Freq: Once | INTRAVENOUS | Status: AC | PRN
Start: 2016-06-19 — End: 2016-06-19
  Administered 2016-06-19: 3.93 via ORAL

## 2016-06-21 ENCOUNTER — Encounter (HOSPITAL_COMMUNITY)
Admission: RE | Admit: 2016-06-21 | Discharge: 2016-06-21 | Disposition: A | Discharge status: Home / Self Care | Payer: BLUE CROSS/BLUE SHIELD | Source: Ambulatory Visit | Attending: "Endocrinology | Admitting: "Endocrinology

## 2016-06-21 DIAGNOSIS — C73 Malignant neoplasm of thyroid gland: Secondary | ICD-10-CM | POA: Diagnosis not present

## 2016-07-01 ENCOUNTER — Ambulatory Visit: Payer: BLUE CROSS/BLUE SHIELD | Admitting: "Endocrinology

## 2016-07-02 ENCOUNTER — Ambulatory Visit (INDEPENDENT_AMBULATORY_CARE_PROVIDER_SITE_OTHER): Payer: BLUE CROSS/BLUE SHIELD | Admitting: "Endocrinology

## 2016-07-02 ENCOUNTER — Encounter: Payer: Self-pay | Admitting: "Endocrinology

## 2016-07-02 VITALS — BP 152/88 | HR 83 | Ht 68.0 in | Wt 188.0 lb

## 2016-07-02 DIAGNOSIS — E118 Type 2 diabetes mellitus with unspecified complications: Secondary | ICD-10-CM

## 2016-07-02 DIAGNOSIS — I10 Essential (primary) hypertension: Secondary | ICD-10-CM | POA: Diagnosis not present

## 2016-07-02 DIAGNOSIS — E1165 Type 2 diabetes mellitus with hyperglycemia: Secondary | ICD-10-CM | POA: Diagnosis not present

## 2016-07-02 DIAGNOSIS — C73 Malignant neoplasm of thyroid gland: Secondary | ICD-10-CM | POA: Diagnosis not present

## 2016-07-02 DIAGNOSIS — Z794 Long term (current) use of insulin: Secondary | ICD-10-CM | POA: Diagnosis not present

## 2016-07-02 DIAGNOSIS — E782 Mixed hyperlipidemia: Secondary | ICD-10-CM | POA: Diagnosis not present

## 2016-07-02 DIAGNOSIS — E890 Postprocedural hypothyroidism: Secondary | ICD-10-CM

## 2016-07-02 DIAGNOSIS — IMO0002 Reserved for concepts with insufficient information to code with codable children: Secondary | ICD-10-CM

## 2016-07-02 NOTE — Progress Notes (Signed)
Subjective:    Patient ID: Dean Hurley, male    DOB: 1951/01/18,    Past Medical History:  Diagnosis Date  . Cancer ([place])   . Diabetes mellitus without complication ([place])   . Hypertension    Past Surgical History:  Procedure Laterality Date  . BACK SURGERY    . THYROID SURGERY     Thyroidectomy  . TRANSURETHRAL RESECTION OF PROSTATE     Social History   Social History  . Marital status: Married    Spouse name: N/A  . Number of children: N/A  . Years of education: N/A   Social History Main Topics  . Smoking status: Former Research scientist (life sciences)  . Smokeless tobacco: None  . Alcohol use No  . Drug use: No  . Sexual activity: Not Asked   Other Topics Concern  . None   Social History Narrative  . None   Outpatient Encounter Prescriptions as of 07/02/2016  Medication Sig  . oxyCODONE-acetaminophen (PERCOCET/ROXICET) 5-325 MG tablet Take 1 tablet by mouth every 6 (six) hours as needed for severe pain.  . Insulin Detemir (LEVEMIR FLEXTOUCH) 100 UNIT/ML Pen INJECT 30UNITS SUBCUTANEOUSLY AT 10PM  . Insulin Pen Needle (B-D ULTRAFINE III SHORT PEN) 31G X 8 MM MISC 1 each by Does not apply route as directed.  Marland Kitchen levothyroxine (SYNTHROID, LEVOTHROID) 137 MCG tablet TAKE 1 TABLET BY MOUTH EVERY MORNING BEFORE BREAKFAST  . lisinopril (PRINIVIL,ZESTRIL) 20 MG tablet Take 1 tablet (20 mg total) by mouth daily.  . meclizine (ANTIVERT) 25 MG tablet Take 25 mg by mouth 3 (three) times daily as needed for dizziness.  . metFORMIN (GLUCOPHAGE) 1000 MG tablet TAKE 1 TABLET BY MOUTH TWICE A DAY WITH A MEAL  . rosuvastatin (CRESTOR) 5 MG tablet Take 1 tablet (5 mg total) by mouth at bedtime.  . sitaGLIPtin (JANUVIA) 25 MG tablet Take 1 tablet (25 mg total) by mouth daily.  . [DISCONTINUED] linagliptin (TRADJENTA) 5 MG TABS tablet Take 1 tablet (5 mg total) by mouth daily.  . [DISCONTINUED] saxagliptin HCl (ONGLYZA) 5 MG TABS tablet Take by mouth daily.   No facility-administered encounter  medications on file as of 07/02/2016.    ALLERGIES: Allergies  Allergen Reactions  . Codeine   . Contrast Media [Iodinated Diagnostic Agents]    VACCINATION STATUS:  There is no immunization history on file for this patient.  HPI   65 yr old male who underwent total thyroidectomy in [place] on June 02, 2012. 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 1.7 cm cancer nodule on the right lobe and 0.4 cm cancer nodule on the left lobe. Partial invasion of capsule, margins negative. He underwent thyrogen stimulated thyroid remnant ablation with negative whole body scan for distant metastasis on July 17, 2013.  His May 2014 thyroid /neck ultrasound was negative for any mass lesion.  His follow up thyrogen WBS on 10/25/13 was c/w successful ablation of thyroid remnants with no evidence of local recurrence or metastatic disease. He has  had another thyroid ultrasound on 10/11/14 which was negative for residual thyroid tissue. On his next  surveillance thyroid ultrasound on September 6,2016 however he has had a 9 mm residual tissue on the  right thyroid bed, Which grew to 12 mm in follow-up ultrasound in December 2016. He was sent to [facility] where he had his prior surgery. Fine needle  aspiration of the residual tissue did not reveal diagnostic material.  - On thyroid/neck ultrasound  on 02/01/2016 showed that the  remnant thyroid tissue decreased in size to 1.0 cm, all other  findings unchanged. He denies neck symptoms. He is here to f/u with Thyrogen stimulated whole-body scan on  06/21/2016. This was reported to be negative for any iodine avid metastatic thyroid cancer.  He is currently on Synthroid 137 mcg po qam.   He feels better. no new complaints.   He is also being treated for type 2 DM , last a1c has improved to 7.9% from 8.4% .  He has been more compliant to his Levemir ,  Metformin, and onglyza . He came with out any  meter nor logs.  No new  complaints.  Review of Systems  Constitutional: no weight gain/loss, no fatigue, no subjective hyperthermia/hypothermia Eyes: no blurry vision, no xerophthalmia ENT: no sore throat, no nodules palpated in throat, no dysphagia/odynophagia, no hoarseness Cardiovascular: no CP/SOB/palpitations/leg swelling Respiratory: no cough/SOB Gastrointestinal: no N/V/D/C Musculoskeletal: no muscle/joint aches Skin: no rashes Neurological: no tremors/numbness/tingling/dizziness Psychiatric: no depression/anxiety   Objective:    BP (!) 152/88   Pulse 83   Ht '5\' 8"'  (1.727 m)   Wt 188 lb (85.3 kg)   BMI 28.59 kg/m   Wt Readings from Last 3 Encounters:  07/02/16 188 lb (85.3 kg)  02/01/16 188 lb (85.3 kg)  10/30/15 191 lb (86.6 kg)    Physical Exam  Constitutional: overweight, in NAD Eyes: PERRLA, EOMI, no exophthalmos ENT: moist mucous membranes, thyroidectomy scar , no cervical lymphadenopathy Cardiovascular: RRR, No MRG Respiratory: CTA B Gastrointestinal: abdomen soft, NT, ND, BS+ Musculoskeletal: no deformities, strength intact in all 4 Skin: moist, warm, no rashes Neurological: no tremor with outstretched hands, DTR normal in all 4   Thyrogen stimulated whole-body scan on 06/21/2016: IMPRESSION: Negative whole-body I 131 scan for recurrent iodine avid thyroid Cancer.  06/14/2016 thyroglobulin antibodies negative, thyroglobulin by IME less than 0.1  Assessment & Plan:   1. Thyroid cancer [facility]):  Patient is s/p total thyroidectomy for stage 1 ( T1bNoMx) PTC , multicentric ( 1.7 cm on right lobe and 0.4cms in the left lobe). on July 17, 2012, thyrogen stimulated RAI thyroid remnant ablation given his BRAF positive presentation.  The post therapy scan was negative for distant metastasis. His May 2014 thyroid ultrasound is negative for remnant tissue.  Subsequent surveillance thyrogen stimulated WBS on 10/25/13 was c/w successful ablation of thyroid remnants, with no evidence  of local recurrence nor metastatic disease.  His repeat thyroid ultrasound on 10/11/14 showed no residual thyroid tissue. However, on his last surveillance thyroid/neck ultrasound she was found to have a new 80m residual/recurrent soft tissue nodule in the right  thyroidectomy bed. On subsequent surveillance thyroid ultrasound this tissue was found to be larger at 12 mm. Attempt to biopsy this up to [facility] was not diagnostic.   - On thyroid/neck ultrasound before this visit on 02/01/2016 showed that the remnant thyroid tissue decreased in size to 1.0 cm, all other  findings unchanged. This could be a recurrent malignancy.  There was no sign of residual thyroid tissue on whole-body scan performed 2015.  She is thyroglobulin by IMA was less than 0.1 associated with negative thyroglobulin antibodies on 06/14/2016.  - Thyrogen estimated whole-body scan on 06/21/2016 was negative for iodine avid metastasis take thyroid cancer.  - No further  intervention needed at this time, he will need thyroid/neck ultrasound in 6 month .  2. Postsurgical hypothyroidism - He did not do his thyroid function tests before this visit.  I will continue his levothyroxine  at 137 g by mouth every morning.   We discussed about correct intake of levothyroxine, at fasting, with water, separated by at least 30 minutes from breakfast, and separated by more than 4 hours from calcium, iron, multivitamins, acid reflux medications (PPIs). -Patient is made aware of the fact that thyroid hormone replacement is needed for life, dose to be adjusted by periodic monitoring of thyroid function tests.  3. Uncontrolled type 2 diabetes mellitus with complication, with long-term current use of insulin ([place]) he did  not comply for monitoring, his last A1c was 7.9%. I advised him to resume Levemir 30 units daily at bedtime, metformin 1000 minute grams by mouth twice a day, and Tradjenta 5 mg by mouth daily. He is urged to continue to monitor at  least once in the morning before breakfast and at any other time as needed.    4. Hyperlipidemia He is on crestor 5 mg po qhs, LDL is high at 126.  5. Essential hypertension: Uncontrolled . - I urged him to be consistent on his medications including ACEi. He will continue on Lisinopril 20 mg po qday.    I advised patient to maintain close follow up with their PCP for primary care needs. Follow up plan: Return in about 3 months (around 10/02/2016) for meter, and logs.  Glade Lloyd, MD Phone: [phone]  Fax: [phone]   07/02/2016, 9:44 AM

## 2016-07-02 NOTE — Patient Instructions (Signed)

## 2016-08-21 ENCOUNTER — Emergency Department (HOSPITAL_COMMUNITY)
Admission: EM | Admit: 2016-08-21 | Discharge: 2016-08-21 | Discharge status: Against Medical Advice | Payer: BLUE CROSS/BLUE SHIELD | Attending: Dermatology | Admitting: Dermatology

## 2016-08-21 ENCOUNTER — Encounter (HOSPITAL_COMMUNITY): Payer: Self-pay | Admitting: Emergency Medicine

## 2016-08-21 DIAGNOSIS — Z87891 Personal history of nicotine dependence: Secondary | ICD-10-CM | POA: Diagnosis not present

## 2016-08-21 DIAGNOSIS — R109 Unspecified abdominal pain: Secondary | ICD-10-CM | POA: Diagnosis present

## 2016-08-21 DIAGNOSIS — Z794 Long term (current) use of insulin: Secondary | ICD-10-CM | POA: Insufficient documentation

## 2016-08-21 DIAGNOSIS — E119 Type 2 diabetes mellitus without complications: Secondary | ICD-10-CM | POA: Insufficient documentation

## 2016-08-21 DIAGNOSIS — I10 Essential (primary) hypertension: Secondary | ICD-10-CM | POA: Insufficient documentation

## 2016-08-21 DIAGNOSIS — Z5321 Procedure and treatment not carried out due to patient leaving prior to being seen by health care provider: Secondary | ICD-10-CM | POA: Insufficient documentation

## 2016-08-21 LAB — URINALYSIS, ROUTINE W REFLEX MICROSCOPIC
Bilirubin Urine: NEGATIVE
Glucose, UA: >=500 mg/dL — AB
Leukocytes, UA: NEGATIVE
Nitrite: NEGATIVE
Protein, ur: NEGATIVE mg/dL
Specific Gravity, Urine: 1.02 (ref 1.005–1.030)
pH: 5.5 (ref 5.0–8.0)

## 2016-08-21 LAB — URINALYSIS, MICROSCOPIC (REFLEX)
Bacteria, UA: NONE SEEN
Squamous Epithelial / LPF: NONE SEEN
WBC, UA: NONE SEEN WBC/hpf (ref 0–5)

## 2016-08-21 NOTE — ED Notes (Signed)
Pt called to bring back to room with no answer. Registration reports he left.

## 2016-08-21 NOTE — ED Triage Notes (Signed)
History of kidney stones but did have back surgery 04/22/16 for 4 disc placement.  Rates pain 9/10.

## 2016-08-23 LAB — URINE CULTURE: Culture: NO GROWTH

## 2016-09-23 ENCOUNTER — Other Ambulatory Visit: Payer: Self-pay | Admitting: "Endocrinology

## 2016-09-29 ENCOUNTER — Other Ambulatory Visit: Payer: Self-pay | Admitting: "Endocrinology

## 2016-10-03 ENCOUNTER — Ambulatory Visit: Payer: BLUE CROSS/BLUE SHIELD | Admitting: "Endocrinology

## 2016-10-25 ENCOUNTER — Other Ambulatory Visit: Payer: Self-pay | Admitting: "Endocrinology

## 2016-10-26 LAB — CMP14+EGFR
ALT: 17 IU/L (ref 0–44)
AST: 14 IU/L (ref 0–40)
Albumin/Globulin Ratio: 1.8 (ref 1.2–2.2)
Albumin: 4.6 g/dL (ref 3.6–4.8)
Alkaline Phosphatase: 81 IU/L (ref 39–117)
BUN/Creatinine Ratio: 19 (ref 10–24)
BUN: 19 mg/dL (ref 8–27)
Bilirubin Total: 0.4 mg/dL (ref 0.0–1.2)
CO2: 19 mmol/L (ref 18–29)
Calcium: 9.6 mg/dL (ref 8.6–10.2)
Chloride: 99 mmol/L (ref 96–106)
Creatinine, Ser: 0.99 mg/dL (ref 0.76–1.27)
GFR calc Af Amer: 92 mL/min/{1.73_m2} (ref >59)
GFR calc non Af Amer: 80 mL/min/{1.73_m2} (ref >59)
Globulin, Total: 2.6 g/dL (ref 1.5–4.5)
Glucose: 300 mg/dL — ABNORMAL HIGH (ref 65–99)
Potassium: 4.3 mmol/L (ref 3.5–5.2)
Sodium: 139 mmol/L (ref 134–144)
Total Protein: 7.2 g/dL (ref 6.0–8.5)

## 2016-10-26 LAB — TSH: TSH: 2.7 u[IU]/mL (ref 0.450–4.500)

## 2016-10-26 LAB — T4, FREE: Free T4: 1.43 ng/dL (ref 0.82–1.77)

## 2016-10-26 LAB — HEMOGLOBIN A1C
Est. average glucose Bld gHb Est-mCnc: 226 mg/dL
Hgb A1c MFr Bld: 9.5 % — ABNORMAL HIGH (ref 4.8–5.6)

## 2016-11-10 ENCOUNTER — Other Ambulatory Visit: Payer: Self-pay | Admitting: "Endocrinology

## 2016-11-23 ENCOUNTER — Other Ambulatory Visit: Payer: Self-pay | Admitting: "Endocrinology

## 2016-12-02 ENCOUNTER — Ambulatory Visit (INDEPENDENT_AMBULATORY_CARE_PROVIDER_SITE_OTHER): Payer: BLUE CROSS/BLUE SHIELD | Admitting: "Endocrinology

## 2016-12-02 ENCOUNTER — Encounter: Payer: Self-pay | Admitting: "Endocrinology

## 2016-12-02 VITALS — BP 135/81 | HR 82 | Ht 68.0 in | Wt 204.0 lb

## 2016-12-02 DIAGNOSIS — IMO0002 Reserved for concepts with insufficient information to code with codable children: Secondary | ICD-10-CM

## 2016-12-02 DIAGNOSIS — E782 Mixed hyperlipidemia: Secondary | ICD-10-CM | POA: Diagnosis not present

## 2016-12-02 DIAGNOSIS — E890 Postprocedural hypothyroidism: Secondary | ICD-10-CM | POA: Diagnosis not present

## 2016-12-02 DIAGNOSIS — E118 Type 2 diabetes mellitus with unspecified complications: Secondary | ICD-10-CM | POA: Diagnosis not present

## 2016-12-02 DIAGNOSIS — I10 Essential (primary) hypertension: Secondary | ICD-10-CM

## 2016-12-02 DIAGNOSIS — C73 Malignant neoplasm of thyroid gland: Secondary | ICD-10-CM | POA: Diagnosis not present

## 2016-12-02 DIAGNOSIS — Z794 Long term (current) use of insulin: Secondary | ICD-10-CM | POA: Diagnosis not present

## 2016-12-02 DIAGNOSIS — E1165 Type 2 diabetes mellitus with hyperglycemia: Secondary | ICD-10-CM

## 2016-12-02 MED ORDER — INSULIN DETEMIR 100 UNIT/ML FLEXPEN: PEN_INJECTOR | SUBCUTANEOUS | 2 refills | Status: DC

## 2016-12-02 NOTE — Progress Notes (Signed)
Subjective:    Patient ID: Dean Hurley, male    DOB: 1950-11-09,    Past Medical History:  Diagnosis Date  . Cancer ([place])   . Diabetes mellitus without complication ([place])   . Hypertension   . Thyroid cancer [facility])    Past Surgical History:  Procedure Laterality Date  . BACK SURGERY    . THYROID SURGERY     Thyroidectomy  . TRANSURETHRAL RESECTION OF PROSTATE     Social History   Social History  . Marital status: Married    Spouse name: N/A  . Number of children: N/A  . Years of education: N/A   Social History Main Topics  . Smoking status: Former Research scientist (life sciences)  . Smokeless tobacco: Never Used  . Alcohol use No  . Drug use: No  . Sexual activity: Not Asked   Other Topics Concern  . None   Social History Narrative  . None   Outpatient Encounter Prescriptions as of 12/02/2016  Medication Sig  . B-D ULTRAFINE III SHORT PEN 31G X 8 MM MISC USE AS DIRECTED  . Insulin Detemir (LEVEMIR FLEXTOUCH) 100 UNIT/ML Pen INJECT 50 UNITS SUBCUTANEOUSLY AT 10PM  . JANUVIA 25 MG tablet TAKE 1 TABLET BY MOUTH EVERY DAY  . levothyroxine (SYNTHROID, LEVOTHROID) 137 MCG tablet TAKE 1 TABLET BY MOUTH EVERY MORNING BEFORE BREAKFAST  . lisinopril (PRINIVIL,ZESTRIL) 20 MG tablet TAKE 1 TABLET BY MOUTH EVERY DAY  . meclizine (ANTIVERT) 25 MG tablet Take 25 mg by mouth 3 (three) times daily as needed for dizziness.  . metFORMIN (GLUCOPHAGE) 1000 MG tablet TAKE 1 TABLET BY MOUTH TWICE A DAY WITH A MEAL  . oxyCODONE-acetaminophen (PERCOCET/ROXICET) 5-325 MG tablet Take 1 tablet by mouth every 6 (six) hours as needed for severe pain.  . rosuvastatin (CRESTOR) 5 MG tablet TAKE 1 TABLET BY MOUTH AT BEDTIME  . [DISCONTINUED] Insulin Detemir (LEVEMIR FLEXTOUCH) 100 UNIT/ML Pen INJECT 30UNITS SUBCUTANEOUSLY AT 10PM (Patient taking differently: 40 Units at bedtime. INJECT 30UNITS SUBCUTANEOUSLY AT 10PM)   No facility-administered encounter medications on file as of 12/02/2016.     ALLERGIES: Allergies  Allergen Reactions  . Codeine   . Contrast Media [Iodinated Diagnostic Agents]    VACCINATION STATUS:  There is no immunization history on file for this patient.  HPI   66 yr old male who underwent total thyroidectomy in [place] on June 02, 2012. 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 1.7 cm cancer nodule on the right lobe and 0.4 cm cancer nodule on the left lobe. Partial invasion of capsule, margins negative. He underwent thyrogen stimulated thyroid remnant ablation with negative whole body scan for distant metastasis on July 17, 2013.  His May 2014 thyroid /neck ultrasound was negative for any mass lesion.  His follow up thyrogen WBS on 10/25/13 was c/w successful ablation of thyroid remnants with no evidence of local recurrence or metastatic disease. He has  had another thyroid ultrasound on 10/11/14 which was negative for residual thyroid tissue. On his next  surveillance thyroid ultrasound on September 6,2016 however he has had a 9 mm residual tissue on the  right thyroid bed, Which grew to 12 mm in follow-up ultrasound in December 2016. He was sent to [facility] where he had his prior surgery. Fine needle  aspiration of the residual tissue did not reveal diagnostic material.  - On thyroid/neck ultrasound  on 02/01/2016 showed that the remnant thyroid tissue decreased in size to 1.0 cm, all other  findings unchanged.  He denies neck symptoms. He is here to f/u with Thyrogen stimulated whole-body scan on  06/21/2016. This was reported to be negative for any iodine avid metastatic thyroid cancer.  He is currently on Synthroid 137 mcg po qam.   He feels better. no new complaints.   He is also being treated for type 2 DM , last a1c has increased to 9.5% from 7.9%.   He has been more compliant to his Levemir ,  Metformin, and Januvia. No new complaints. He has gained 14 pounds weight.  Review of Systems  Constitutional: no weight  gain/loss, no fatigue, no subjective hyperthermia/hypothermia Eyes: no blurry vision, no xerophthalmia ENT: no sore throat, no nodules palpated in throat, no dysphagia/odynophagia, no hoarseness Cardiovascular: no CP/SOB/palpitations/leg swelling Respiratory: no cough/SOB Gastrointestinal: no N/V/D/C Musculoskeletal: no muscle/joint aches Skin: no rashes Neurological: no tremors/numbness/tingling/dizziness Psychiatric: no depression/anxiety   Objective:    BP 135/81   Pulse 82   Ht 5\' 8"  (1.727 m)   Wt 204 lb (92.5 kg)   BMI 31.02 kg/m   Wt Readings from Last 3 Encounters:  12/02/16 204 lb (92.5 kg)  08/21/16 195 lb (88.5 kg)  07/02/16 188 lb (85.3 kg)    Physical Exam  Constitutional: overweight, in NAD Eyes: PERRLA, EOMI, no exophthalmos ENT: moist mucous membranes, thyroidectomy scar , no cervical lymphadenopathy Cardiovascular: RRR, No MRG Respiratory: CTA B Gastrointestinal: abdomen soft, NT, ND, BS+ Musculoskeletal: no deformities, strength intact in all 4 Skin: moist, warm, no rashes Neurological: no tremor with outstretched hands, DTR normal in all 4   Thyrogen stimulated whole-body scan on 06/21/2016: IMPRESSION: Negative whole-body I 131 scan for recurrent iodine avid thyroid Cancer.  06/14/2016 thyroglobulin antibodies negative, thyroglobulin by IME less than 0.1  Assessment & Plan:   1.  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 on July 17, 2012, thyrogen stimulated RAI thyroid remnant ablation given his BRAF positive presentation.  The post therapy scan was negative for distant metastasis. His May 2014 thyroid ultrasound is negative for remnant tissue.  Subsequent surveillance thyrogen stimulated WBS on 10/25/13 was c/w successful ablation of thyroid remnants, with no evidence of local recurrence nor metastatic disease.  His  repeat thyroid ultrasound on 10/11/14 showed no residual thyroid tissue. However, on his last surveillance thyroid/neck ultrasound she was found to have a new 3mm residual/recurrent soft tissue nodule in the right  thyroidectomy bed. On subsequent surveillance thyroid ultrasound this tissue was found to be larger at 12 mm. Attempt to biopsy this up to [facility] was not diagnostic.   - On thyroid/neck ultrasound before this visit on 02/01/2016 showed that the remnant thyroid tissue decreased in size to 1.0 cm, all other  findings unchanged. This could be a recurrent malignancy.  There was no sign of residual thyroid tissue on whole-body scan performed 2015.  She is thyroglobulin by IMA was less than 0.1 associated with negative thyroglobulin antibodies on 06/14/2016.  - Thyrogen stimulated whole-body scan on 06/21/2016 was negative for iodine avid metastatic thyroid cancer.   - No further  intervention needed at this time, he will need thyroid/neck ultrasound in 6 month .  2. Postsurgical hypothyroidism - His labs are consistent with appropriate replacement with thyroid hormone.  I will continue his levothyroxine at 137 g by mouth every morning.   We discussed about correct intake of levothyroxine, at fasting, with  water, separated by at least 30 minutes from breakfast, and separated by more than 4 hours from calcium, iron, multivitamins, acid reflux medications (PPIs). -Patient is made aware of the fact that thyroid hormone replacement is needed for life, dose to be adjusted by periodic monitoring of thyroid function tests.  3. 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  - He admits to dietary indiscretion causing 14 pounds of weight gain since his last visit. - He may need that/bolus insulin to treat his diabetes. In the meantime,  I advised him to  increase  Levemir to 50 units at bedtime , metformin 1000 minute grams by mouth twice a day, and Januvia 25  mg by mouth daily. He is urged to start monitoring blood glucose 4 times a day-before meals and at bedtime and return in 2 weeks for revaluation with his meter and logs.    4. Hyperlipidemia He is on crestor 5 mg po qhs, LDL is high at 126.  5. Essential hypertension: Uncontrolled . - I urged him to be consistent on his medications including ACEi. He will continue on Lisinopril 20 mg po qday.    I advised patient to maintain close follow up with their PCP for primary care needs. Follow up plan: Return in about 2 weeks (around 12/16/2016) for follow up with meter and logs- no labs.  Glade Lloyd, MD Phone: [phone]  Fax: [phone]   12/02/2016, 4:37 PM

## 2016-12-02 NOTE — Patient Instructions (Signed)

## 2016-12-16 ENCOUNTER — Ambulatory Visit (INDEPENDENT_AMBULATORY_CARE_PROVIDER_SITE_OTHER): Payer: BLUE CROSS/BLUE SHIELD | Admitting: "Endocrinology

## 2016-12-16 ENCOUNTER — Encounter: Payer: Self-pay | Admitting: "Endocrinology

## 2016-12-16 VITALS — BP 127/68 | HR 89 | Ht 68.0 in | Wt 204.0 lb

## 2016-12-16 DIAGNOSIS — Z794 Long term (current) use of insulin: Secondary | ICD-10-CM | POA: Diagnosis not present

## 2016-12-16 DIAGNOSIS — C73 Malignant neoplasm of thyroid gland: Secondary | ICD-10-CM | POA: Diagnosis not present

## 2016-12-16 DIAGNOSIS — E782 Mixed hyperlipidemia: Secondary | ICD-10-CM | POA: Diagnosis not present

## 2016-12-16 DIAGNOSIS — I10 Essential (primary) hypertension: Secondary | ICD-10-CM

## 2016-12-16 DIAGNOSIS — IMO0002 Reserved for concepts with insufficient information to code with codable children: Secondary | ICD-10-CM

## 2016-12-16 DIAGNOSIS — E1165 Type 2 diabetes mellitus with hyperglycemia: Secondary | ICD-10-CM | POA: Diagnosis not present

## 2016-12-16 DIAGNOSIS — E118 Type 2 diabetes mellitus with unspecified complications: Secondary | ICD-10-CM

## 2016-12-16 DIAGNOSIS — E890 Postprocedural hypothyroidism: Secondary | ICD-10-CM

## 2016-12-16 MED ORDER — INSULIN DETEMIR 100 UNIT/ML FLEXPEN: PEN_INJECTOR | SUBCUTANEOUS | 2 refills | Status: DC

## 2016-12-16 NOTE — Patient Instructions (Signed)

## 2016-12-16 NOTE — Progress Notes (Signed)
Subjective:    Patient ID: Dean Hurley, male    DOB: Nov 10, 1950,    Past Medical History:  Diagnosis Date  . Cancer ([place])   . Diabetes mellitus without complication ([place])   . Hypertension   . Thyroid cancer [facility])    Past Surgical History:  Procedure Laterality Date  . BACK SURGERY    . THYROID SURGERY     Thyroidectomy  . TRANSURETHRAL RESECTION OF PROSTATE     Social History   Social History  . Marital status: Married    Spouse name: N/A  . Number of children: N/A  . Years of education: N/A   Social History Main Topics  . Smoking status: Former Research scientist (life sciences)  . Smokeless tobacco: Never Used  . Alcohol use No  . Drug use: No  . Sexual activity: Not on file   Other Topics Concern  . Not on file   Social History Narrative  . No narrative on file   Outpatient Encounter Prescriptions as of 12/16/2016  Medication Sig  . B-D ULTRAFINE III SHORT PEN 31G X 8 MM MISC USE AS DIRECTED  . Insulin Detemir (LEVEMIR FLEXTOUCH) 100 UNIT/ML Pen INJECT 60 UNITS SUBCUTANEOUSLY AT 10PM  . JANUVIA 25 MG tablet TAKE 1 TABLET BY MOUTH EVERY DAY  . levothyroxine (SYNTHROID, LEVOTHROID) 137 MCG tablet TAKE 1 TABLET BY MOUTH EVERY MORNING BEFORE BREAKFAST  . lisinopril (PRINIVIL,ZESTRIL) 20 MG tablet TAKE 1 TABLET BY MOUTH EVERY DAY  . meclizine (ANTIVERT) 25 MG tablet Take 25 mg by mouth 3 (three) times daily as needed for dizziness.  . metFORMIN (GLUCOPHAGE) 1000 MG tablet TAKE 1 TABLET BY MOUTH TWICE A DAY WITH A MEAL  . oxyCODONE-acetaminophen (PERCOCET/ROXICET) 5-325 MG tablet Take 1 tablet by mouth every 6 (six) hours as needed for severe pain.  . rosuvastatin (CRESTOR) 5 MG tablet TAKE 1 TABLET BY MOUTH AT BEDTIME  . [DISCONTINUED] Insulin Detemir (LEVEMIR FLEXTOUCH) 100 UNIT/ML Pen INJECT 50 UNITS SUBCUTANEOUSLY AT 10PM   No facility-administered encounter medications on file as of 12/16/2016.    ALLERGIES: Allergies  Allergen Reactions  . Codeine   . Contrast Media [Iodinated  Diagnostic Agents]    VACCINATION STATUS:  There is no immunization history on file for this patient.  HPI   66 yr old male who underwent total thyroidectomy in [place] on June 02, 2012. 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 1.7 cm cancer nodule on the right lobe and 0.4 cm cancer nodule on the left lobe. Partial invasion of capsule, margins negative. He underwent thyrogen stimulated thyroid remnant ablation with negative whole body scan for distant metastasis on July 17, 2013.  His May 2014 thyroid /neck ultrasound was negative for any mass lesion.  His follow up thyrogen WBS on 10/25/13 was c/w successful ablation of thyroid remnants with no evidence of local recurrence or metastatic disease. He has  had another thyroid ultrasound on 10/11/14 which was negative for residual thyroid tissue. On his next  surveillance thyroid ultrasound on September 6,2016 however he has had a 9 mm residual tissue on the  right thyroid bed, Which grew to 12 mm in follow-up ultrasound in December 2016. [facility] was sent to [facility] where he had his prior surgery. Fine needle  aspiration of the residual tissue did not reveal diagnostic material.  - On thyroid/neck ultrasound  on 02/01/2016 showed that the remnant thyroid tissue decreased in size to 1.0 cm, all other  findings unchanged. He denies neck symptoms. He  is here to f/u with Thyrogen stimulated whole-body scan on  06/21/2016. This was reported to be negative for any iodine avid metastatic thyroid cancer.  He is currently on Synthroid 137 mcg po qam.   He feels better. no new complaints.   He is also being treated for type 2 DM , last a1c has increased to 9.5% from 7.9%.   He has been more compliant to his Levemir currently at 50 units daily at bedtime ,  Metformin, and Januvia. No new complaints. He has recently gained 14 pounds weight.  Review of Systems  Constitutional:  +recent weight gain,  no fatigue, no subjective  hyperthermia/hypothermia Eyes: no blurry vision, no xerophthalmia ENT: no sore throat, no nodules palpated in throat, no dysphagia/odynophagia, no hoarseness Cardiovascular: no CP/SOB/palpitations/leg swelling Respiratory: no cough/SOB Gastrointestinal: no N/V/D/C Musculoskeletal: no muscle/joint aches Skin: no rashes Neurological: no tremors/numbness/tingling/dizziness Psychiatric: no depression/anxiety   Objective:    BP 127/68   Pulse 89   Ht '5\' 8"'  (1.727 m)   Wt 204 lb (92.5 kg)   BMI 31.02 kg/m   Wt Readings from Last 3 Encounters:  12/16/16 204 lb (92.5 kg)  12/02/16 204 lb (92.5 kg)  08/21/16 195 lb (88.5 kg)    Physical Exam  Constitutional: overweight, in NAD Eyes: PERRLA, EOMI, no exophthalmos ENT: moist mucous membranes, thyroidectomy scar , no cervical lymphadenopathy Cardiovascular: RRR, No MRG Respiratory: CTA B Gastrointestinal: abdomen soft, NT, ND, BS+ Musculoskeletal: no deformities, strength intact in all 4 Skin: moist, warm, no rashes Neurological: no tremor with outstretched hands, DTR normal in all 4   Recent Results (from the past 2160 hour(s))  Hemoglobin A1c     Status: Abnormal   Collection Time: 10/25/16 11:29 AM  Result Value Ref Range   Hgb A1c MFr Bld 9.5 (H) 4.8 - 5.6 %    Comment:          Pre-diabetes: 5.7 - 6.4          Diabetes: >6.4          Glycemic control for adults with diabetes: <7.0    Est. average glucose Bld gHb Est-mCnc 226 mg/dL  TSH     Status: None   Collection Time: 10/25/16 11:29 AM  Result Value Ref Range   TSH 2.700 0.450 - 4.500 uIU/mL  T4, free     Status: None   Collection Time: 10/25/16 11:29 AM  Result Value Ref Range   Free T4 1.43 0.82 - 1.77 ng/dL  CMP14+EGFR     Status: Abnormal   Collection Time: 10/25/16 11:29 AM  Result Value Ref Range   Glucose 300 (H) 65 - 99 mg/dL   BUN 19 8 - 27 mg/dL   Creatinine, Ser 0.99 0.76 - 1.27 mg/dL   GFR calc non Af Amer 80 >59 mL/min/1.73   GFR calc Af Amer 92  >59 mL/min/1.73   BUN/Creatinine Ratio 19 10 - 24   Sodium 139 134 - 144 mmol/L   Potassium 4.3 3.5 - 5.2 mmol/L   Chloride 99 96 - 106 mmol/L   CO2 19 18 - 29 mmol/L   Calcium 9.6 8.6 - 10.2 mg/dL   Total Protein 7.2 6.0 - 8.5 g/dL   Albumin 4.6 3.6 - 4.8 g/dL   Globulin, Total 2.6 1.5 - 4.5 g/dL   Albumin/Globulin Ratio 1.8 1.2 - 2.2   Bilirubin Total 0.4 0.0 - 1.2 mg/dL   Alkaline Phosphatase 81 39 - 117 IU/L   AST 14 0 -  40 IU/L   ALT 17 0 - 44 IU/L     Thyrogen stimulated whole-body scan on 06/21/2016: IMPRESSION: Negative whole-body I 131 scan for recurrent iodine avid thyroid Cancer.  06/14/2016 thyroglobulin antibodies negative, thyroglobulin by IME less than 0.1  Assessment & Plan:   1.  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 on July 17, 2012, thyrogen stimulated RAI thyroid remnant ablation given his BRAF positive presentation.  The post therapy scan was negative for distant metastasis. His May 2014 thyroid ultrasound is negative for remnant tissue.  Subsequent surveillance thyrogen stimulated WBS on 10/25/13 was c/w successful ablation of thyroid remnants, with no evidence of local recurrence nor metastatic disease.  His repeat thyroid ultrasound on 10/11/14 showed no residual thyroid tissue. However, on his last surveillance thyroid/neck ultrasound she was found to have a new 33m residual/recurrent soft tissue nodule in the right  thyroidectomy bed. On subsequent surveillance thyroid ultrasound this tissue was found to be larger at 12 mm. Attempt to biopsy this up to [facility] was not diagnostic.   - On thyroid/neck ultrasound before this visit on 02/01/2016 showed that the remnant thyroid tissue decreased in size to 1.0 cm, all other  findings unchanged. This could be a recurrent malignancy.  There was no sign of residual thyroid tissue on  whole-body scan performed 2015.  She is thyroglobulin by IMA was less than 0.1 associated with negative thyroglobulin antibodies on 06/14/2016.  - Thyrogen stimulated whole-body scan on 06/21/2016 was negative for iodine avid metastatic thyroid cancer.   - No further  intervention needed at this time, he will need thyroid/neck ultrasound in 6 month .  2. Postsurgical hypothyroidism - His labs are consistent with appropriate replacement with thyroid hormone.  I will continue his levothyroxine at 137 g by mouth every morning.   We discussed about correct intake of levothyroxine, at fasting, with water, separated by at least 30 minutes from breakfast, and separated by more than 4 hours from calcium, iron, multivitamins, acid reflux medications (PPIs). -Patient is made aware of the fact that thyroid hormone replacement is needed for life, dose to be adjusted by periodic monitoring of thyroid function tests.  3. Uncontrolled type 2 diabetes mellitus with complication, with long-term current use of insulin ([place] - He came with slightly above target blood glucose profile since last visit, his most recent A1c was higher at 9.5%-increasing from 7.9%.    - He admits to dietary indiscretion causing 14 pounds of recent weight gain.  - He will not need bolus insulin for now, in the meantime, I have advised him to increase Levemir to 60 units daily at bedtime, continue metformin 1000 minute grams by mouth twice a day, and Januvia 25  mg by mouth daily. He is urged to start monitoring blood glucose 4 times a day-before meals and at bedtime and return in 2 weeks for revaluation with his meter and logs.    4. Hyperlipidemia He is on crestor 5 mg po qhs, LDL is high at 126.  5. Essential hypertension: Uncontrolled . - I urged him to be consistent on his medications including ACEi. He will continue on Lisinopril 20 mg po qday.    I advised patient to maintain close follow up with their PCP for primary  care needs. Follow up plan: Return in about 3 months (around 03/18/2017) for follow up with pre-visit  labs, meter, and logs.  Glade Lloyd, MD Phone: [phone]  Fax: [phone]   12/16/2016, 3:25 PM

## 2016-12-20 ENCOUNTER — Other Ambulatory Visit: Payer: Self-pay | Admitting: "Endocrinology

## 2017-01-08 ENCOUNTER — Encounter: Payer: BLUE CROSS/BLUE SHIELD | Attending: "Endocrinology | Admitting: Nutrition

## 2017-01-08 ENCOUNTER — Encounter: Payer: Self-pay | Admitting: Nutrition

## 2017-01-08 VITALS — Ht 68.0 in | Wt 203.0 lb

## 2017-01-08 DIAGNOSIS — Z794 Long term (current) use of insulin: Principal | ICD-10-CM

## 2017-01-08 DIAGNOSIS — E118 Type 2 diabetes mellitus with unspecified complications: Principal | ICD-10-CM

## 2017-01-08 DIAGNOSIS — E1165 Type 2 diabetes mellitus with hyperglycemia: Secondary | ICD-10-CM

## 2017-01-08 DIAGNOSIS — E669 Obesity, unspecified: Secondary | ICD-10-CM

## 2017-01-08 DIAGNOSIS — IMO0002 Reserved for concepts with insufficient information to code with codable children: Secondary | ICD-10-CM

## 2017-01-08 NOTE — Patient Instructions (Signed)
Goals 1. Follow My Plate 2 Eat 12-19 grams of carbs per meals-3-4 carb choices per meal 3. Dink only water-5/6 bottles of water per day 4. Eat fruit with meal instead of snack No snacks between meals unless veggies Lose 1 lbs per week Exercise 30 minutes 3-4 times per week Get A1C down to 7% or less

## 2017-01-09 NOTE — Progress Notes (Signed)
Diabetes Self-Management Education  Visit Type: First/Initial  Appt. Start Time: 1400  Appt. End Time: 7106  01/09/2017  [name], identified by name and date of birth, is a 66 y.o. male with a diagnosis of Diabetes: Type 2.   ASSESSMENT  Height 5\' 8"  (1.727 m), weight 203 lb (92.1 kg). Body mass index is 30.87 kg/m.      Diabetes Self-Management Education - 01/08/17 1429      Visit Information   Visit Type First/Initial     Initial Visit   Diabetes Type Type 2   Are you currently following a meal plan? No   Are you taking your medications as prescribed? Yes   Date Diagnosed 2005     Health Coping   How would you rate your overall health? Fair     Psychosocial Assessment   Patient Belief/Attitude about Diabetes Motivated to manage diabetes   Self-care barriers None   Self-management support Family   Other persons present Patient   Patient Concerns Nutrition/Meal planning;Medication;Monitoring;Healthy Lifestyle;Glycemic Control;Weight Control   Special Needs None   Preferred Learning Style No preference indicated   Learning Readiness Not Ready   How often do you need to have someone help you when you read instructions, pamphlets, or other written materials from your doctor or pharmacy? 1 - Never   What is the last grade level you completed in school? 12     Pre-Education Assessment   Patient understands the diabetes disease and treatment process. Needs Review   Patient understands incorporating nutritional management into lifestyle. Needs Review   Patient undertands incorporating physical activity into lifestyle. Needs Review   Patient understands using medications safely. Needs Review   Patient understands monitoring blood glucose, interpreting and using results Needs Review   Patient understands prevention, detection, and treatment of acute complications. Needs Review   Patient understands prevention, detection, and treatment of chronic complications.  Needs Review   Patient understands how to develop strategies to address psychosocial issues. Needs Review   Patient understands how to develop strategies to promote health/change behavior. Needs Review     Complications   Last HgB A1C per patient/outside source 9.5 %   How often do you check your blood sugar? 1-2 times/day   Fasting Blood glucose range (mg/dL) 130-179   Postprandial Blood glucose range (mg/dL) 130-179   Number of hypoglycemic episodes per month 0   Number of hyperglycemic episodes per week 10   Can you tell when your blood sugar is high? Yes   What do you do if your blood sugar is high? water,   Have you had a dilated eye exam in the past 12 months? No   Have you had a dental exam in the past 12 months? No   Are you checking your feet? Yes   How many days per week are you checking your feet? 7     Dietary Intake   Breakfast Oatmeal or scrambled eggs,  coffee   Snack (morning) apple   Lunch 6" sub-bacon ranch [place],  Diet coke and chips   Snack (afternoon) fruit   Dinner Salmon, water, or diet soda,    Beverage(s) water, diet soda     Exercise   Exercise Type Light (walking / raking leaves)   How many days per week to you exercise? 3   How many minutes per day do you exercise? 30   Total minutes per week of exercise 90     Patient Education   Disease state  Definition of diabetes, type 1 and 2, and the diagnosis of diabetes;Factors that contribute to the development of diabetes;Explored patient's options for treatment of their diabetes   Nutrition management  Role of diet in the treatment of diabetes and the relationship between the three main macronutrients and blood glucose level;Food label reading, portion sizes and measuring food.;Carbohydrate counting;Information on hints to eating out and maintain blood glucose control.;Meal timing in regards to the patients' current diabetes medication.;Meal options for control of blood glucose level and chronic  complications.   Physical activity and exercise  Role of exercise on diabetes management, blood pressure control and cardiac health.;Identified with patient nutritional and/or medication changes necessary with exercise.;Helped patient identify appropriate exercises in relation to his/her diabetes, diabetes complications and other health issue.   Medications Taught/reviewed insulin injection, site rotation, insulin storage and needle disposal.;Reviewed patients medication for diabetes, action, purpose, timing of dose and side effects.;Reviewed medication adjustment guidelines for hyperglycemia and sick days.   Monitoring Taught/evaluated SMBG meter.;Purpose and frequency of SMBG.;Taught/discussed recording of test results and interpretation of SMBG.;Interpreting lab values - A1C, lipid, urine microalbumina.;Identified appropriate SMBG and/or A1C goals.   Acute complications Taught treatment of hypoglycemia - the 15 rule.;Discussed and identified patients' treatment of hyperglycemia.   Chronic complications Relationship between chronic complications and blood glucose control;Assessed and discussed foot care and prevention of foot problems;Lipid levels, blood glucose control and heart disease;Dental care;Identified and discussed with patient  current chronic complications;Retinopathy and reason for yearly dilated eye exams;Nephropathy, what it is, prevention of, the use of ACE, ARB's and early detection of through urine microalbumia.;Reviewed with patient heart disease, higher risk of, and prevention   Psychosocial adjustment Role of stress on diabetes;Worked with patient to identify barriers to care and solutions   Personal strategies to promote health Lifestyle issues that need to be addressed for better diabetes care;Helped patient develop diabetes management plan for (enter comment)     Individualized Goals (developed by patient)   Nutrition Follow meal plan discussed;General guidelines for healthy  choices and portions discussed;Adjust meds/carbs with exercise as discussed   Physical Activity Exercise 3-5 times per week;60 minutes per day   Medications take my medication as prescribed   Monitoring  test my blood glucose as discussed;test blood glucose pre and post meals as discussed   Reducing Risk examine blood glucose patterns;do foot checks daily;get labs drawn;increase portions of healthy fats     Post-Education Assessment   Patient understands the diabetes disease and treatment process. Needs Review   Patient understands incorporating nutritional management into lifestyle. Needs Review   Patient undertands incorporating physical activity into lifestyle. Needs Review   Patient understands using medications safely. Needs Review   Patient understands monitoring blood glucose, interpreting and using results Needs Review   Patient understands prevention, detection, and treatment of acute complications. Needs Review   Patient understands prevention, detection, and treatment of chronic complications. Needs Review   Patient understands how to develop strategies to address psychosocial issues. Needs Review   Patient understands how to develop strategies to promote health/change behavior. Needs Review     Outcomes   Expected Outcomes Demonstrated interest in learning. Expect positive outcomes   Future DMSE 4-6 wks   Program Status Completed      Individualized Plan for Diabetes Self-Management Training:   Learning Objective:  Patient will have a greater understanding of diabetes self-management. Patient education plan is to attend individual and/or group sessions per assessed needs and concerns.   Plan:   Patient  Instructions  Goals 1. Follow My Plate 2 Eat 46-28 grams of carbs per meals-3-4 carb choices per meal 3. Dink only water-5/6 bottles of water per day 4. Eat fruit with meal instead of snack No snacks between meals unless veggies Lose 1 lbs per week Exercise 30 minutes  3-4 times per week Get A1C down to 7% or less    Expected Outcomes:  Demonstrated interest in learning. Expect positive outcomes  Education material provided: Living Well with Diabetes, Food label handouts, A1C conversion sheet, Meal plan card, My Plate and Carbohydrate counting sheet  If problems or questions, patient to contact team via:  Phone and Email  Future DSME appointment: 4-6 wks

## 2017-01-18 ENCOUNTER — Other Ambulatory Visit: Payer: Self-pay | Admitting: "Endocrinology

## 2017-02-12 ENCOUNTER — Other Ambulatory Visit: Payer: Self-pay | Admitting: "Endocrinology

## 2017-03-11 ENCOUNTER — Other Ambulatory Visit: Payer: Self-pay | Admitting: "Endocrinology

## 2017-03-17 ENCOUNTER — Ambulatory Visit: Payer: BLUE CROSS/BLUE SHIELD | Admitting: Nutrition

## 2017-03-17 ENCOUNTER — Ambulatory Visit: Payer: BLUE CROSS/BLUE SHIELD | Admitting: "Endocrinology

## 2017-03-17 ENCOUNTER — Other Ambulatory Visit: Payer: Self-pay | Admitting: "Endocrinology

## 2017-03-17 LAB — COMPREHENSIVE METABOLIC PANEL
ALT: 22 U/L (ref 9–46)
AST: 14 U/L (ref 10–35)
Albumin: 4.6 g/dL (ref 3.6–5.1)
Alkaline Phosphatase: 63 U/L (ref 40–115)
BUN: 23 mg/dL (ref 7–25)
CO2: 21 mmol/L (ref 20–32)
Calcium: 9.5 mg/dL (ref 8.6–10.3)
Chloride: 100 mmol/L (ref 98–110)
Creat: 1.05 mg/dL (ref 0.70–1.25)
Glucose, Bld: 258 mg/dL — ABNORMAL HIGH (ref 65–99)
Potassium: 4.5 mmol/L (ref 3.5–5.3)
Sodium: 136 mmol/L (ref 135–146)
Total Bilirubin: 0.4 mg/dL (ref 0.2–1.2)
Total Protein: 7.2 g/dL (ref 6.1–8.1)

## 2017-03-17 LAB — T4, FREE: Free T4: 1.4 ng/dL (ref 0.8–1.8)

## 2017-03-17 LAB — TSH: TSH: 0.94 mIU/L (ref 0.40–4.50)

## 2017-03-18 LAB — HEMOGLOBIN A1C
Hgb A1c MFr Bld: 8.9 % — ABNORMAL HIGH (ref <5.7)
Mean Plasma Glucose: 209 mg/dL

## 2017-03-21 ENCOUNTER — Other Ambulatory Visit: Payer: Self-pay | Admitting: "Endocrinology

## 2017-03-23 ENCOUNTER — Other Ambulatory Visit: Payer: Self-pay | Admitting: "Endocrinology

## 2017-04-08 ENCOUNTER — Encounter: Payer: BLUE CROSS/BLUE SHIELD | Attending: "Endocrinology | Admitting: Nutrition

## 2017-04-08 ENCOUNTER — Encounter: Payer: Self-pay | Admitting: "Endocrinology

## 2017-04-08 ENCOUNTER — Ambulatory Visit (INDEPENDENT_AMBULATORY_CARE_PROVIDER_SITE_OTHER): Payer: BLUE CROSS/BLUE SHIELD | Admitting: "Endocrinology

## 2017-04-08 VITALS — BP 114/77 | HR 78 | Ht 68.0 in | Wt 201.0 lb

## 2017-04-08 VITALS — Ht 68.0 in | Wt 201.0 lb

## 2017-04-08 DIAGNOSIS — E1165 Type 2 diabetes mellitus with hyperglycemia: Secondary | ICD-10-CM | POA: Diagnosis not present

## 2017-04-08 DIAGNOSIS — E669 Obesity, unspecified: Secondary | ICD-10-CM

## 2017-04-08 DIAGNOSIS — E782 Mixed hyperlipidemia: Secondary | ICD-10-CM | POA: Diagnosis not present

## 2017-04-08 DIAGNOSIS — E118 Type 2 diabetes mellitus with unspecified complications: Secondary | ICD-10-CM | POA: Diagnosis not present

## 2017-04-08 DIAGNOSIS — I10 Essential (primary) hypertension: Secondary | ICD-10-CM

## 2017-04-08 DIAGNOSIS — Z794 Long term (current) use of insulin: Secondary | ICD-10-CM

## 2017-04-08 DIAGNOSIS — C73 Malignant neoplasm of thyroid gland: Secondary | ICD-10-CM

## 2017-04-08 DIAGNOSIS — E890 Postprocedural hypothyroidism: Secondary | ICD-10-CM

## 2017-04-08 DIAGNOSIS — IMO0002 Reserved for concepts with insufficient information to code with codable children: Secondary | ICD-10-CM

## 2017-04-08 MED ORDER — GLUCOSE BLOOD VI STRP: ORAL_STRIP | 2 refills | Status: DC

## 2017-04-08 NOTE — Progress Notes (Signed)
Subjective:    Patient ID: Dean Hurley, male    DOB: 1951-02-18,    Past Medical History:  Diagnosis Date  . Cancer ([place])   . Diabetes mellitus without complication ([place])   . Hypertension   . Thyroid cancer [facility])    Past Surgical History:  Procedure Laterality Date  . BACK SURGERY    . THYROID SURGERY     Thyroidectomy  . TRANSURETHRAL RESECTION OF PROSTATE     Social History   Social History  . Marital status: Married    Spouse name: N/A  . Number of children: N/A  . Years of education: N/A   Social History Main Topics  . Smoking status: Former Research scientist (life sciences)  . Smokeless tobacco: Never Used  . Alcohol use No  . Drug use: No  . Sexual activity: Not Asked   Other Topics Concern  . None   Social History Narrative  . None   Outpatient Encounter Prescriptions as of 04/08/2017  Medication Sig  . B-D ULTRAFINE III SHORT PEN 31G X 8 MM MISC USE AS DIRECTED  . glucose blood (ACCU-CHEK GUIDE) test strip Use as instructed  . JANUVIA 25 MG tablet TAKE 1 TABLET BY MOUTH EVERY DAY  . LEVEMIR FLEXTOUCH 100 UNIT/ML Pen INJECT 60 UNITS SUBCUTANEOUSLY DAILY AT 10PM  . levothyroxine (SYNTHROID, LEVOTHROID) 137 MCG tablet TAKE 1 TABLET BY MOUTH EVERY MORNING BEFORE BREAKFAST  . lisinopril (PRINIVIL,ZESTRIL) 20 MG tablet TAKE 1 TABLET BY MOUTH EVERY DAY  . meclizine (ANTIVERT) 25 MG tablet Take 25 mg by mouth 3 (three) times daily as needed for dizziness.  . metFORMIN (GLUCOPHAGE) 1000 MG tablet TAKE 1 TABLET BY MOUTH TWICE A DAY WITH A MEAL  . oxyCODONE-acetaminophen (PERCOCET/ROXICET) 5-325 MG tablet Take 1 tablet by mouth every 6 (six) hours as needed for severe pain.  . rosuvastatin (CRESTOR) 5 MG tablet TAKE 1 TABLET BY MOUTH AT BEDTIME  . [DISCONTINUED] LEVEMIR FLEXTOUCH 100 UNIT/ML Pen INJECT 50 UNITS SUBCUTANEOUSLY AT 10PM   No facility-administered encounter medications on file as of 04/08/2017.    ALLERGIES: Allergies  Allergen Reactions  . Codeine   . Contrast  Media [Iodinated Diagnostic Agents]    VACCINATION STATUS:  There is no immunization history on file for this patient.  HPI   66 yr old male who underwent total thyroidectomy in [place] on June 02, 2012. 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 1.7 cm cancer nodule on the right lobe and 0.4 cm cancer nodule on the left lobe. Partial invasion of capsule, margins negative. He underwent thyrogen stimulated I131  thyroid remnant ablation with negative whole body scan for distant metastasis on July 17, 2013.  His May 2014 thyroid /neck ultrasound was negative for any mass lesion.  His follow up Thyrogen stimulated WBS on 10/25/13 was c/w successful ablation of thyroid remnants with no evidence of local recurrence or metastatic disease. He has  had another thyroid ultrasound on 10/11/14 which was negative for residual thyroid tissue. On his next  surveillance thyroid ultrasound on September 6,2016 however he has had a 9 mm residual tissue on the  right thyroid bed, which grew to 12 mm in follow-up ultrasound in December 2016. He was sent to [facility] where he had his prior surgery. Fine needle  aspiration of the residual tissue did not reveal diagnostic material.  - On thyroid/neck ultrasound  on 02/01/2016 showed that the remnant thyroid tissue decreased in size to 1.0 cm, all other  findings unchanged.  -  His last  Thyrogen stimulated whole-body scan on  11/10/201 was  reported to be negative for any iodine avid metastatic thyroid cancer.   He is currently on Synthroid 137 mcg po qam.  he is compliant with this medication.  He feels better. no new complaints.   He is also being treated for type 2 DM , not very compliant to this diagnosis, however his A1c has improved to 8.9% from 9.5% last visit.  He has been  inconsistent in taking his Levemir currently at 60 units daily at bedtime ,  Metformin, and Januvia.  Review of Systems  Constitutional:  +steady  weight ,   no fatigue, no subjective hyperthermia/hypothermia Eyes: no blurry vision, no xerophthalmia ENT: no sore throat, no nodules palpated in throat, no dysphagia/odynophagia, no hoarseness Cardiovascular: no CP/SOB/palpitations/leg swelling Respiratory: no cough/SOB Gastrointestinal: no N/V/D, + constipation Musculoskeletal: no muscle/joint aches, + back pain Skin: no rashes Neurological: no tremors/numbness/tingling/dizziness Psychiatric: no depression/anxiety   Objective:    BP 114/77   Pulse 78   Ht '5\' 8"'  (1.727 m)   Wt 201 lb (91.2 kg)   BMI 30.56 kg/m   Wt Readings from Last 3 Encounters:  04/08/17 201 lb (91.2 kg)  04/08/17 201 lb (91.2 kg)  01/08/17 203 lb (92.1 kg)    Physical Exam  Constitutional: overweight, in NAD Eyes: PERRLA, EOMI, no exophthalmos ENT: moist mucous membranes, +thyroidectomy scar , no cervical lymphadenopathy Cardiovascular: RRR, No MRG Respiratory: CTA B Gastrointestinal: abdomen soft, NT, ND, BS+ Musculoskeletal: no deformities, strength intact in all 4 Skin: moist, warm, no rashes Neurological: no tremor with outstretched hands, DTR normal in all 4   Recent Results (from the past 2160 hour(s))  Comprehensive metabolic panel     Status: Abnormal   Collection Time: 03/17/17 11:33 AM  Result Value Ref Range   Sodium 136 135 - 146 mmol/L   Potassium 4.5 3.5 - 5.3 mmol/L   Chloride 100 98 - 110 mmol/L   CO2 21 20 - 32 mmol/L    Comment: ** Please note change in reference range(s). **      Glucose, Bld 258 (H) 65 - 99 mg/dL   BUN 23 7 - 25 mg/dL   Creat 1.05 0.70 - 1.25 mg/dL    Comment:   For patients > or = 65 years of age: The upper reference limit for Creatinine is approximately 13% higher for people identified as African-American.      Total Bilirubin 0.4 0.2 - 1.2 mg/dL   Alkaline Phosphatase 63 40 - 115 U/L   AST 14 10 - 35 U/L   ALT 22 9 - 46 U/L   Total Protein 7.2 6.1 - 8.1 g/dL   Albumin 4.6 3.6 - 5.1 g/dL   Calcium 9.5  8.6 - 10.3 mg/dL  TSH     Status: None   Collection Time: 03/17/17 11:33 AM  Result Value Ref Range   TSH 0.94 0.40 - 4.50 mIU/L  T4, free     Status: None   Collection Time: 03/17/17 11:33 AM  Result Value Ref Range   Free T4 1.4 0.8 - 1.8 ng/dL  Hemoglobin A1c     Status: Abnormal   Collection Time: 03/17/17 11:33 AM  Result Value Ref Range   Hgb A1c MFr Bld 8.9 (H) <5.7 %    Comment:   For someone without known diabetes, a hemoglobin A1c value of 6.5% or greater indicates that they may have diabetes and this should be confirmed with  a follow-up test.   For someone with known diabetes, a value <7% indicates that their diabetes is well controlled and a value greater than or equal to 7% indicates suboptimal control. A1c targets should be individualized based on duration of diabetes, age, comorbid conditions, and other considerations.   Currently, no consensus exists for use of hemoglobin A1c for diagnosis of diabetes for children.      Mean Plasma Glucose 209 mg/dL     Thyrogen stimulated whole-body scan on 06/21/2016: IMPRESSION: Negative whole-body I 131 scan for recurrent iodine avid thyroid Cancer.  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 on July 17, 2012, thyrogen stimulated RAI thyroid remnant ablation given his BRAF positive presentation.  The post therapy scan was negative for distant metastasis. His May 2014 thyroid ultrasound is negative for remnant tissue.  Subsequent surveillance thyrogen stimulated WBS on 10/25/13 was c/w successful ablation of thyroid remnants, with no evidence of local recurrence nor metastatic disease.  His repeat thyroid ultrasound on 10/11/14 showed no residual thyroid tissue. However, on his last  surveillance thyroid/neck ultrasound she was found to have a new 28m residual/recurrent soft tissue nodule in the right  thyroidectomy bed. On subsequent surveillance thyroid ultrasound this tissue was found to be larger at 12 mm. Attempt to biopsy this up to [facility] was not diagnostic.   - On thyroid/neck ultrasound before this visit on 02/01/2016 showed that the remnant thyroid tissue decreased in size to 1.0 cm, all other  findings unchanged.  There was no sign of residual thyroid tissue on whole-body scan performed 2015.  She is thyroglobulin by IMA was less than 0.1 associated with negative thyroglobulin antibodies on 06/14/2016.  - Thyrogen stimulated whole-body scan on 06/21/2016 was negative for iodine avid metastatic thyroid cancer.   - No further  intervention needed at this time, he will need thyroid/neck ultrasound before his next visit in 3 months.   2. Postsurgical hypothyroidism - His labs are consistent with appropriate replacement with thyroid hormone.  I will continue his levothyroxine at 137 g by mouth every morning.   We discussed about correct intake of levothyroxine, at fasting, with water, separated by at least 30 minutes from breakfast, and separated by more than 4 hours from calcium, iron, multivitamins, acid reflux medications (PPIs). -Patient is made aware of the fact that thyroid hormone replacement is needed for life, dose to be adjusted by periodic monitoring of thyroid function tests.  3. Uncontrolled type 2 diabetes mellitus with complication, with long-term current use of insulin ([place] - He came with slightly  better and near target fasting  blood glucose profile since last visit, his most recent A1c is slightly better at 8.9% from 9.5%.  - He did have interval in exposure to high-dose steroids related to his back pain, and has not been consistent taking his medications..  - He will not need bolus insulin for now, in the meantime, I have advised him to be  consistent and continue  Levemir  60 units daily at bedtime, continue metformin 1000 minute grams by mouth twice a day, and Januvia 25  mg by mouth daily. He is urged to continue monitoring blood glucose at least 2 times a day-daily before breakfast and at bedtime.  4. Hyperlipidemia: Uncontrolled  He  is on crestor 5 mg po qhs, LDL is high at 126.  5. Essential hypertension: Uncontrolled . - I urged him to be consistent on his medications including ACEi. He will continue on Lisinopril 20 mg po qday.  I advised patient to maintain close follow up with his PCP for primary care needs. - Time spent with the patient: 25 min, of which >50% was spent in reviewing his sugar logs , discussing his hypo- and hyper-glycemic episodes, reviewing his current and  previous labs and insulin doses and developing a plan to avoid hypo- and hyper-glycemia.   Follow up plan: Return in about 3 months (around 07/09/2017) for meter, and logs, Thyroid / Neck Ultrasound.  Glade Lloyd, MD Phone: [phone]  Fax: [phone]  This note was partially dictated with voice recognition software. Similar sounding words can be transcribed inadequately or may not  be corrected upon review.  04/08/2017, 12:09 PM

## 2017-04-08 NOTE — Patient Instructions (Signed)

## 2017-04-08 NOTE — Patient Instructions (Signed)
Goals 1. Increase fiber content diet 25 grams of fiber per day to help with digestions/constipation 2. Drink gallon of water per day 3. Increase fresh fruits fruits and vegetables 4.  Walk 30 minues 3 times per week Call PCP about MIralax doses to take daily.

## 2017-04-08 NOTE — Progress Notes (Signed)
Diabetes Self-Management Education  Visit Type:  Follow-up  Appt. Start Time: 1030 Appt. End Time: 1100  04/08/2017  [name], identified by name and date of birth, is a 66 y.o. male with a diagnosis of Diabetes: Type 2. He notes she is eating better balanaced meals. Trying to eat more fresh fruits and vegetables. Due to long shifts, he tends to eat out often at lunch. Packs lunch sometimes.  Admits to need to start walking.   .  Taking 60 units Levemir, Januvia and Metformin 2 g/d. Testing blood sugars in am but forgetting to take at night due to falling asleep. Willing to continue to eat better and be more active and test at night.   Complains of constipation often due to back problems and having to take pain meds. Encouraged him to talk to PCP about Miralax. ASSESSMENT  Height 5\' 8"  (1.727 m), weight 201 lb (91.2 kg). Body mass index is 30.56 kg/m.       Diabetes Self-Management Education - 04/08/17 1040      Health Coping   How would you rate your overall health? Good     Dietary Intake   Breakfast Oatmeal or biscuit,    Lunch 4-6 firehouse sub-turkey bacon ranch,  water or dt soda     Subsequent Visit   Since your last visit have you experienced any weight changes? Loss      Learning Objective:  Patient will have a greater understanding of diabetes self-management. Patient education plan is to attend individual and/or group sessions per assessed needs and concerns.   Plan:   Patient Instructions  Goals 1. Increase fiber content diet 25 grams of fiber per day to help with digestions/constipation 2. Drink gallon of water per day 3. Increase fresh fruits fruits and vegetables 4.  Walk 30 minues 3 times per week Call PCP about MIralax doses to take daily.     Expected Outcomes:    Improved blood sugars and reduced risk of complications and improved self management of his diabetes.  Education material provided: Meal plan card and Carbohydrate  counting sheet  If problems or questions, patient to contact team via:  Phone and Email  Future DSME appointment: -   F/u 3 months.

## 2017-04-20 ENCOUNTER — Other Ambulatory Visit: Payer: Self-pay | Admitting: "Endocrinology

## 2017-04-22 ENCOUNTER — Ambulatory Visit: Payer: BLUE CROSS/BLUE SHIELD | Admitting: Nutrition

## 2017-04-22 ENCOUNTER — Ambulatory Visit: Payer: BLUE CROSS/BLUE SHIELD | Admitting: "Endocrinology

## 2017-05-12 ENCOUNTER — Other Ambulatory Visit: Payer: Self-pay | Admitting: "Endocrinology

## 2017-05-27 ENCOUNTER — Other Ambulatory Visit: Payer: Self-pay | Admitting: "Endocrinology

## 2017-06-06 ENCOUNTER — Other Ambulatory Visit: Payer: Self-pay | Admitting: "Endocrinology

## 2017-06-23 ENCOUNTER — Other Ambulatory Visit: Payer: Self-pay | Admitting: "Endocrinology

## 2017-06-25 ENCOUNTER — Other Ambulatory Visit: Payer: Self-pay | Admitting: "Endocrinology

## 2017-06-26 ENCOUNTER — Other Ambulatory Visit: Payer: Self-pay | Admitting: "Endocrinology

## 2017-07-09 ENCOUNTER — Ambulatory Visit: Payer: BLUE CROSS/BLUE SHIELD | Admitting: "Endocrinology

## 2017-07-09 ENCOUNTER — Ambulatory Visit: Payer: BLUE CROSS/BLUE SHIELD | Admitting: Nutrition

## 2017-07-12 ENCOUNTER — Other Ambulatory Visit: Payer: Self-pay | Admitting: "Endocrinology

## 2017-07-14 ENCOUNTER — Ambulatory Visit: Payer: BLUE CROSS/BLUE SHIELD | Admitting: "Endocrinology

## 2017-07-16 LAB — RENAL FUNCTION PANEL
Albumin: 4.6 g/dL (ref 3.6–5.1)
BUN: 17 mg/dL (ref 7–25)
CO2: 25 mmol/L (ref 20–32)
Calcium: 10 mg/dL (ref 8.6–10.3)
Chloride: 103 mmol/L (ref 98–110)
Creat: 1.19 mg/dL (ref 0.70–1.25)
Glucose, Bld: 181 mg/dL — ABNORMAL HIGH (ref 65–139)
Phosphorus: 3.2 mg/dL (ref 2.1–4.3)
Potassium: 4.1 mmol/L (ref 3.5–5.3)
Sodium: 139 mmol/L (ref 135–146)

## 2017-07-16 LAB — HEMOGLOBIN A1C
Hgb A1c MFr Bld: 9.5 % of total Hgb — ABNORMAL HIGH (ref <5.7)
Mean Plasma Glucose: 226 (calc)
eAG (mmol/L): 12.5 (calc)

## 2017-07-16 LAB — TSH: TSH: 12.87 mIU/L — ABNORMAL HIGH (ref 0.40–4.50)

## 2017-07-16 LAB — T4, FREE: Free T4: 1.2 ng/dL (ref 0.8–1.8)

## 2017-07-18 ENCOUNTER — Ambulatory Visit (INDEPENDENT_AMBULATORY_CARE_PROVIDER_SITE_OTHER): Payer: BLUE CROSS/BLUE SHIELD | Admitting: "Endocrinology

## 2017-07-18 ENCOUNTER — Encounter: Payer: Self-pay | Admitting: "Endocrinology

## 2017-07-18 VITALS — BP 137/81 | HR 86 | Ht 68.0 in | Wt 201.0 lb

## 2017-07-18 DIAGNOSIS — C73 Malignant neoplasm of thyroid gland: Secondary | ICD-10-CM | POA: Diagnosis not present

## 2017-07-18 DIAGNOSIS — Z9119 Patient's noncompliance with other medical treatment and regimen: Secondary | ICD-10-CM

## 2017-07-18 DIAGNOSIS — E890 Postprocedural hypothyroidism: Secondary | ICD-10-CM | POA: Diagnosis not present

## 2017-07-18 DIAGNOSIS — I10 Essential (primary) hypertension: Secondary | ICD-10-CM | POA: Diagnosis not present

## 2017-07-18 DIAGNOSIS — E118 Type 2 diabetes mellitus with unspecified complications: Secondary | ICD-10-CM

## 2017-07-18 DIAGNOSIS — Z91199 Patient's noncompliance with other medical treatment and regimen due to unspecified reason: Secondary | ICD-10-CM | POA: Insufficient documentation

## 2017-07-18 DIAGNOSIS — Z794 Long term (current) use of insulin: Secondary | ICD-10-CM | POA: Diagnosis not present

## 2017-07-18 DIAGNOSIS — E1165 Type 2 diabetes mellitus with hyperglycemia: Secondary | ICD-10-CM

## 2017-07-18 DIAGNOSIS — IMO0002 Reserved for concepts with insufficient information to code with codable children: Secondary | ICD-10-CM

## 2017-07-18 MED ORDER — LISINOPRIL-HYDROCHLOROTHIAZIDE 20-12.5 MG PO TABS
ORAL_TABLET | ORAL | 1 refills | Status: DC
Start: 2017-07-18 — End: 2018-03-03

## 2017-07-18 MED ORDER — LEVOTHYROXINE SODIUM 150 MCG PO TABS: ORAL_TABLET | ORAL | 6 refills | Status: DC

## 2017-07-18 NOTE — Progress Notes (Signed)
Subjective:    Patient ID: Dean Hurley, male    DOB: 04-21-1951,    Past Medical History:  Diagnosis Date  . Cancer ([place])   . Diabetes mellitus without complication ([place])   . Hypertension   . Thyroid cancer [facility])    Past Surgical History:  Procedure Laterality Date  . BACK SURGERY    . THYROID SURGERY     Thyroidectomy  . TRANSURETHRAL RESECTION OF PROSTATE     Social History   Socioeconomic History  . Marital status: Married    Spouse name: None  . Number of children: None  . Years of education: None  . Highest education level: None  Social Needs  . Financial resource strain: None  . Food insecurity - worry: None  . Food insecurity - inability: None  . Transportation needs - medical: None  . Transportation needs - non-medical: None  Occupational History  . None  Tobacco Use  . Smoking status: Former Research scientist (life sciences)  . Smokeless tobacco: Never Used  Substance and Sexual Activity  . Alcohol use: No    Alcohol/week: 0.0 oz  . Drug use: No  . Sexual activity: None  Other Topics Concern  . None  Social History Narrative  . None   Outpatient Encounter Medications as of 07/18/2017  Medication Sig  . B-D ULTRAFINE III SHORT PEN 31G X 8 MM MISC USE AS DIRECTED  . glucose blood (ACCU-CHEK GUIDE) test strip Use as directed 4 x daily. e11.65  . Insulin Detemir (LEVEMIR FLEXTOUCH) 100 UNIT/ML Pen Inject 60 Units into the skin at bedtime. INJECT 60 UNITS SUBCUTANEOUSLY AT 10PM  . LEVEMIR FLEXTOUCH 100 UNIT/ML Pen INJECT 60 UNITS SUBCUTANEOUSLY DAILY AT 10PM  . levothyroxine (SYNTHROID, LEVOTHROID) 150 MCG tablet TAKE 1 TABLET BY MOUTH EVERY MORNING BEFORE BREAKFAST  . lisinopril-hydrochlorothiazide (PRINZIDE,ZESTORETIC) 20-12.5 MG tablet TAKE ONE TABLET BY MOUTH DAILY , NEEDS APPOINTMENT  . meclizine (ANTIVERT) 25 MG tablet Take 25 mg by mouth 3 (three) times daily as needed for dizziness.  . metFORMIN (GLUCOPHAGE) 1000 MG tablet TAKE 1 TABLET BY MOUTH TWICE A DAY WITH A  MEAL  . oxyCODONE-acetaminophen (PERCOCET/ROXICET) 5-325 MG tablet Take 1 tablet by mouth every 6 (six) hours as needed for severe pain.  . rosuvastatin (CRESTOR) 5 MG tablet TAKE 1 TABLET BY MOUTH AT BEDTIME  . [DISCONTINUED] JANUVIA 25 MG tablet TAKE 1 TABLET BY MOUTH EVERY DAY  . [DISCONTINUED] levothyroxine (SYNTHROID, LEVOTHROID) 137 MCG tablet TAKE 1 TABLET BY MOUTH EVERY MORNING BEFORE BREAKFAST  . [DISCONTINUED] lisinopril (PRINIVIL,ZESTRIL) 20 MG tablet TAKE 1 TABLET BY MOUTH EVERY DAY  . [DISCONTINUED] lisinopril-hydrochlorothiazide (PRINZIDE,ZESTORETIC) 20-12.5 MG tablet TAKE ONE TABLET BY MOUTH DAILY , NEEDS APPOINTMENT   No facility-administered encounter medications on file as of 07/18/2017.    ALLERGIES: Allergies  Allergen Reactions  . Codeine   . Contrast Media [Iodinated Diagnostic Agents]    VACCINATION STATUS:  There is no immunization history on file for this patient.  HPI   66 yr old male who underwent total thyroidectomy in [place] on June 02, 2012. 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 1.7 cm cancer nodule on the right lobe and 0.4 cm cancer nodule on the left lobe. Partial invasion of capsule, margins negative. He underwent thyrogen stimulated I131  thyroid remnant ablation with negative whole body scan for distant metastasis on July 17, 2013.  His May 2014 thyroid /neck ultrasound was negative for any mass lesion.  His follow up Thyrogen stimulated  WBS on 10/25/13 was c/w successful ablation of thyroid remnants with no evidence of local recurrence or metastatic disease. He has  had another thyroid ultrasound on 10/11/14 which was negative for residual thyroid tissue. On his next  surveillance thyroid ultrasound on September 6,2016 however he has had a 9 mm residual tissue on the  right thyroid bed, which grew to 12 mm in follow-up ultrasound in December 2016. He was sent to [facility] where he had his prior surgery. Fine needle   aspiration of the residual tissue did not reveal diagnostic material.  - On thyroid/neck ultrasound  on 02/01/2016 showed that the remnant thyroid tissue decreased in size to 1.0 cm, all other  findings unchanged.  - His last  Thyrogen stimulated whole-body scan on  06/21/2016 was  reported to be negative for any iodine avid metastatic thyroid cancer.  - On 06/27/2017 : His previsit ultrasound at an outside facility in [facility] revealed an echogenic focus measuring 1.7cm x 0.7cm x 1.1 cm ( previously 1 cm-1.2 cm in [facility] imaging on 02/01/2016) potentially representing a small amount of residual thyroid tissue versus small lymph node. There is no identifiable tissue on the left, no identifiable isthmus.   He is currently on Synthroid 137 mcg po qam. He reports compliance to this medication.   He is also being treated for type 2 DM , not very compliant to this diagnosis, he measured blood glucose only this last 7 days, his A1c higher at 9.5% from 8.9%. - He was supposed to be on Levemir 60 units daily at bedtime, metformin 1000 mg by mouth twice a day, Januvia 25 mg by mouth daily associated with monitoring of blood glucose at least twice a day.  He came with a meter showing only 8 readings in the last 90 days. - He cannot confirm consistency in taking these 3 medications for diabetes.   Review of Systems  Constitutional:  +steady  weight ,  + fatigue, no subjective hyperthermia/hypothermia Eyes: no blurry vision, no xerophthalmia ENT: no sore throat, no nodules palpated in throat, no dysphagia/odynophagia, no hoarseness Cardiovascular: no CP/SOB/palpitations/leg swelling Respiratory: no cough/SOB Gastrointestinal: no N/V/D, + constipation Musculoskeletal: no muscle/joint aches, + back pain Skin: no rashes Neurological: no tremors/numbness/tingling/dizziness Psychiatric: no depression/anxiety   Objective:    BP 137/81   Pulse 86   Ht _0  (1.727 m)    Wt 201 lb (91.2 kg)   BMI 30.56 kg/m   Wt Readings from Last 3 Encounters:  07/18/17 201 lb (91.2 kg)  04/08/17 201 lb (91.2 kg)  04/08/17 201 lb (91.2 kg)    Physical Exam  Constitutional: overweight, in NAD Eyes: PERRLA, EOMI, no exophthalmos ENT: moist mucous membranes, +thyroidectomy scar , no cervical lymphadenopathy Cardiovascular: RRR, No MRG Respiratory: CTA B Gastrointestinal: abdomen soft, NT, ND, BS+ Musculoskeletal: no deformities, strength intact in all 4 Skin: moist, warm, no rashes Neurological: no tremor with outstretched hands, DTR normal in all 4   Recent Results (from the past 2160 hour(s))  Renal function panel     Status: Abnormal   Collection Time: 07/15/17  2:12 PM  Result Value Ref Range   Glucose, Bld 181 (H) 65 - 139 mg/dL    Comment: .        Non-fasting reference interval .    BUN 17 7 - 25 mg/dL   Creat 1.19 0.70 - 1.25 mg/dL    Comment: For patients >28 years of age, the reference limit for  Creatinine is approximately 13% higher for people identified as African-American. .    BUN/Creatinine Ratio NOT APPLICABLE 6 - 22 (calc)   Sodium 139 135 - 146 mmol/L   Potassium 4.1 3.5 - 5.3 mmol/L   Chloride 103 98 - 110 mmol/L   CO2 25 20 - 32 mmol/L   Calcium 10.0 8.6 - 10.3 mg/dL   Phosphorus 3.2 2.1 - 4.3 mg/dL   Albumin 4.6 3.6 - 5.1 g/dL  Hemoglobin A1c     Status: Abnormal   Collection Time: 07/15/17  2:12 PM  Result Value Ref Range   Hgb A1c MFr Bld 9.5 (H) <5.7 % of total Hgb    Comment: For someone without known diabetes, a hemoglobin A1c value of 6.5% or greater indicates that they may have  diabetes and this should be confirmed with a follow-up  test. . For someone with known diabetes, a value <7% indicates  that their diabetes is well controlled and a value  greater than or equal to 7% indicates suboptimal  control. A1c targets should be individualized based on  duration of diabetes, age, comorbid conditions, and  other  considerations. . Currently, no consensus exists regarding use of hemoglobin A1c for diagnosis of diabetes for children. .    Mean Plasma Glucose 226 (calc)   eAG (mmol/L) 12.5 (calc)  T4, free     Status: None   Collection Time: 07/15/17  2:12 PM  Result Value Ref Range   Free T4 1.2 0.8 - 1.8 ng/dL  TSH     Status: Abnormal   Collection Time: 07/15/17  2:12 PM  Result Value Ref Range   TSH 12.87 (H) 0.40 - 4.50 mIU/L     Thyrogen stimulated whole-body scan on 06/21/2016: IMPRESSION: Negative whole-body I 131 scan for recurrent iodine avid thyroid Cancer.  06/14/2016 thyroglobulin antibodies negative, thyroglobulin by IME less than 0.1  06/27/2017 - His previsit ultrasound at an outside facility in [facility] revealed an echogenic focus measuring 1.7cm x 0.7cm x 1.1 cm ( previously 1 cm-1.2 cm in [facility] imaging on 02/01/2016) potentially representing a small amount of residual thyroid tissue versus small lymph node. There is no identifiable tissue on the left, no identifiable isthmus.  Assessment & Plan:   1.  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 on July 17, 2012, thyrogen stimulated RAI thyroid remnant ablation given his BRAF positive presentation.  The post therapy scan was negative for distant metastasis. His May 2014 thyroid ultrasound is negative for remnant tissue.  Subsequent surveillance thyrogen stimulated WBS on 10/25/13 was c/w successful ablation of thyroid remnants, with no evidence of local recurrence nor metastatic disease.  His repeat thyroid ultrasound on 10/11/14 showed no residual thyroid tissue. However, on his last surveillance thyroid/neck ultrasound she was found to have a new 80m residual/recurrent soft tissue nodule in the right  thyroidectomy bed. On subsequent surveillance thyroid ultrasound this  tissue was found to be larger at 12 mm. Attempt to biopsy this up to [facility] was not diagnostic.   - On thyroid/neck ultrasound before this visit on 02/01/2016 showed that the remnant thyroid tissue decreased in size to 1.0 cm, all other  findings unchanged.  There was no sign of residual thyroid tissue on whole-body scan performed 2015.  She is thyroglobulin by IMA was less than 0.1 associated with negative thyroglobulin  antibodies on 06/14/2016.  - Thyrogen stimulated whole-body scan on 06/21/2016 was negative for iodine avid metastatic thyroid cancer.   - Most recent thyroid/neck ultrasound on 06/27/2017 showed persistence of possibly thyroid tissue in the right thyroid lobe bed- this was done within a different facility than his prior imaging studies-subject to variations of measurements.  -he will need repeat  thyroid/neck ultrasound in [facility]  before his next visit in 3 months.  if the residual tissue continues to grow, he would be considered for core biopsy.  2. Postsurgical hypothyroidism - His labs are consistent with inadequate  replacement with thyroid hormone.  I will increase his  levothyroxine to 150 g by mouth every morning.   - We discussed about correct intake of levothyroxine, at fasting, with water, separated by at least 30 minutes from breakfast, and separated by more than 4 hours from calcium, iron, multivitamins, acid reflux medications (PPIs). -Patient is made aware of the fact that thyroid hormone replacement is needed for life, dose to be adjusted by periodic monitoring of thyroid function tests.  3. Uncontrolled type 2 diabetes mellitus with complication, with long-term current use of insulin ([place]) - He  is alarmingly noncompliant. He is not committed for proper monitoring. He is A1c is higher at 9.5%.  - I have re-approached him to be consistent and continue  Levemir  60 units daily at bedtime, continue metformin 1000 mg by mouth twice a day, and  discontinue  Januvia. - He is urged to continue monitoring blood glucose 2 times daily-before breakfast and at bedtime . -  he would be custard for prandial insulin after his commitment is assured for proper monitoring.    4. Hyperlipidemia: Uncontrolled  He is on crestor 5 mg po qhs, LDL is high at 126.  5. Essential hypertension: Uncontrolled . - I urged him to be consistent on his medications including ACEi. He will continue on Lisinopril 20 HCTZ 12.5  mg po qday.  I advised patient to maintain close follow up with his PCP for primary care needs.   Follow up plan: Return in about 3 months (around 10/16/2017) for meter, and logs, Thyroid / Neck Ultrasound.  Glade Lloyd, MD Phone: [phone]  Fax: [phone]   This note was partially dictated with voice recognition software. Similar sounding words can be transcribed inadequately or may not  be corrected upon review.  07/18/2017, 10:36 AM

## 2017-07-28 ENCOUNTER — Ambulatory Visit: Payer: BLUE CROSS/BLUE SHIELD | Admitting: Nutrition

## 2017-07-31 ENCOUNTER — Telehealth: Payer: Self-pay | Admitting: "Endocrinology

## 2017-07-31 NOTE — Telephone Encounter (Signed)
Dean Hurley is calling stating that he would really like to go ahead and do the F/U Thyroid/Neck u/s at [facility] he will be out of work after tomorrow until Jan 2nd , please advise?

## 2017-08-01 NOTE — Telephone Encounter (Signed)
It is too soon to see a difference. A week before his next visit in 3 months or as scheduled.

## 2017-08-01 NOTE — Telephone Encounter (Signed)
appt scheduled Pt notified 

## 2017-08-08 ENCOUNTER — Ambulatory Visit (HOSPITAL_COMMUNITY)
Admission: RE | Admit: 2017-08-08 | Discharge: 2017-08-08 | Disposition: A | Discharge status: Home / Self Care | Payer: BLUE CROSS/BLUE SHIELD | Source: Ambulatory Visit | Attending: "Endocrinology | Admitting: "Endocrinology

## 2017-08-08 DIAGNOSIS — C73 Malignant neoplasm of thyroid gland: Secondary | ICD-10-CM | POA: Diagnosis present

## 2017-08-10 ENCOUNTER — Other Ambulatory Visit: Payer: Self-pay | Admitting: "Endocrinology

## 2017-08-20 ENCOUNTER — Other Ambulatory Visit: Payer: Self-pay

## 2017-08-20 MED ORDER — ACCU-CHEK FASTCLIX LANCETS MISC: 1.0000 | Freq: Two times a day (BID) | 1 refills | Status: DC

## 2017-08-21 ENCOUNTER — Other Ambulatory Visit: Payer: Self-pay

## 2017-08-21 MED ORDER — ACCU-CHEK FASTCLIX LANCETS MISC: 1.0000 | Freq: Two times a day (BID) | 2 refills | Status: DC

## 2017-08-25 ENCOUNTER — Other Ambulatory Visit: Payer: Self-pay

## 2017-08-25 MED ORDER — ACCU-CHEK FASTCLIX LANCETS MISC: 1.0000 | Freq: Two times a day (BID) | 2 refills | Status: DC

## 2017-08-28 ENCOUNTER — Other Ambulatory Visit: Payer: Self-pay | Admitting: "Endocrinology

## 2017-09-18 ENCOUNTER — Telehealth: Payer: Self-pay | Admitting: "Endocrinology

## 2017-09-18 NOTE — Telephone Encounter (Signed)
Left message for pt to call back  °

## 2017-09-18 NOTE — Telephone Encounter (Signed)
No significant change, but he did the ultrasound too soon against my recommondations. He needs to keep his appointment , we may have to repeat the imaging.

## 2017-09-18 NOTE — Telephone Encounter (Signed)
Jaki is calling asking if Dr. Dorris Fetch compared the U/S reports of his Thyroid, please advise?

## 2017-09-19 NOTE — Telephone Encounter (Signed)
Mr. Qazi is aware of the recommendations

## 2017-09-25 ENCOUNTER — Other Ambulatory Visit: Payer: Self-pay | Admitting: "Endocrinology

## 2017-09-25 MED ORDER — LEVOTHYROXINE SODIUM 150 MCG PO TABS: ORAL_TABLET | ORAL | 0 refills | Status: DC

## 2017-10-09 LAB — COMPLETE METABOLIC PANEL WITH GFR
AG Ratio: 1.7 (calc) (ref 1.0–2.5)
ALT: 26 U/L (ref 9–46)
AST: 18 U/L (ref 10–35)
Albumin: 4.3 g/dL (ref 3.6–5.1)
Alkaline phosphatase (APISO): 67 U/L (ref 40–115)
BUN: 17 mg/dL (ref 7–25)
CO2: 28 mmol/L (ref 20–32)
Calcium: 9.6 mg/dL (ref 8.6–10.3)
Chloride: 103 mmol/L (ref 98–110)
Creat: 1.13 mg/dL (ref 0.70–1.25)
GFR, Est African American: 78 mL/min/{1.73_m2} (ref >=60)
GFR, Est Non African American: 67 mL/min/{1.73_m2} (ref >=60)
Globulin: 2.6 g/dL (calc) (ref 1.9–3.7)
Glucose, Bld: 223 mg/dL — ABNORMAL HIGH (ref 65–99)
Potassium: 4.2 mmol/L (ref 3.5–5.3)
Sodium: 140 mmol/L (ref 135–146)
Total Bilirubin: 0.5 mg/dL (ref 0.2–1.2)
Total Protein: 6.9 g/dL (ref 6.1–8.1)

## 2017-10-09 LAB — HEMOGLOBIN A1C
Hgb A1c MFr Bld: 10 % of total Hgb — ABNORMAL HIGH (ref <5.7)
Mean Plasma Glucose: 240 (calc)
eAG (mmol/L): 13.3 (calc)

## 2017-10-09 LAB — T4, FREE: Free T4: 1.5 ng/dL (ref 0.8–1.8)

## 2017-10-09 LAB — TSH: TSH: 1.02 mIU/L (ref 0.40–4.50)

## 2017-10-17 ENCOUNTER — Encounter: Payer: Self-pay | Admitting: "Endocrinology

## 2017-10-17 ENCOUNTER — Ambulatory Visit (INDEPENDENT_AMBULATORY_CARE_PROVIDER_SITE_OTHER): Payer: BLUE CROSS/BLUE SHIELD | Admitting: "Endocrinology

## 2017-10-17 VITALS — BP 157/85 | HR 78 | Ht 68.0 in | Wt 206.4 lb

## 2017-10-17 DIAGNOSIS — C73 Malignant neoplasm of thyroid gland: Secondary | ICD-10-CM

## 2017-10-17 DIAGNOSIS — Z794 Long term (current) use of insulin: Secondary | ICD-10-CM

## 2017-10-17 DIAGNOSIS — E1165 Type 2 diabetes mellitus with hyperglycemia: Secondary | ICD-10-CM | POA: Diagnosis not present

## 2017-10-17 DIAGNOSIS — E890 Postprocedural hypothyroidism: Secondary | ICD-10-CM | POA: Diagnosis not present

## 2017-10-17 DIAGNOSIS — I10 Essential (primary) hypertension: Secondary | ICD-10-CM | POA: Diagnosis not present

## 2017-10-17 DIAGNOSIS — IMO0002 Reserved for concepts with insufficient information to code with codable children: Secondary | ICD-10-CM

## 2017-10-17 DIAGNOSIS — E118 Type 2 diabetes mellitus with unspecified complications: Secondary | ICD-10-CM

## 2017-10-17 MED ORDER — INSULIN ASPART 100 UNIT/ML FLEXPEN: 10.0000 [IU] | PEN_INJECTOR | Freq: Three times a day (TID) | SUBCUTANEOUS | 2 refills | Status: DC

## 2017-10-17 NOTE — Progress Notes (Signed)
Subjective:    Patient ID: Dean Hurley, male    DOB: Jan 25, 1951,    Past Medical History:  Diagnosis Date  . Cancer ([place])   . Diabetes mellitus without complication ([place])   . Hypertension   . Thyroid cancer [facility])    Past Surgical History:  Procedure Laterality Date  . BACK SURGERY    . THYROID SURGERY     Thyroidectomy  . TRANSURETHRAL RESECTION OF PROSTATE     Social History   Socioeconomic History  . Marital status: Married    Spouse name: None  . Number of children: None  . Years of education: None  . Highest education level: None  Social Needs  . Financial resource strain: None  . Food insecurity - worry: None  . Food insecurity - inability: None  . Transportation needs - medical: None  . Transportation needs - non-medical: None  Occupational History  . None  Tobacco Use  . Smoking status: Former Research scientist (life sciences)  . Smokeless tobacco: Never Used  Substance and Sexual Activity  . Alcohol use: No    Alcohol/week: 0.0 oz  . Drug use: No  . Sexual activity: None  Other Topics Concern  . None  Social History Narrative  . None   Outpatient Encounter Medications as of 10/17/2017  Medication Sig  . ACCU-CHEK FASTCLIX LANCETS MISC 1 each by Does not apply route 2 (two) times daily.  . B-D ULTRAFINE III SHORT PEN 31G X 8 MM MISC USE AS DIRECTED  . glucose blood (ACCU-CHEK GUIDE) test strip Use as directed 4 x daily. e11.65  . Insulin Detemir (LEVEMIR FLEXTOUCH) 100 UNIT/ML Pen Inject 60 Units into the skin at bedtime. INJECT 60 UNITS SUBCUTANEOUSLY AT 10PM  . LEVEMIR FLEXTOUCH 100 UNIT/ML Pen INJECT 60 UNITS SUBCUTANEOUSLY DAILY AT 10PM  . levothyroxine (SYNTHROID, LEVOTHROID) 150 MCG tablet TAKE 1 TABLET BY MOUTH EVERY MORNING BEFORE BREAKFAST  . lisinopril-hydrochlorothiazide (PRINZIDE,ZESTORETIC) 20-12.5 MG tablet TAKE ONE TABLET BY MOUTH DAILY , NEEDS APPOINTMENT  . meclizine (ANTIVERT) 25 MG tablet Take 25 mg by mouth 3 (three) times daily as needed for  dizziness.  . metFORMIN (GLUCOPHAGE) 1000 MG tablet TAKE 1 TABLET BY MOUTH TWICE A DAY WITH A MEAL  . oxyCODONE-acetaminophen (PERCOCET/ROXICET) 5-325 MG tablet Take 1 tablet by mouth every 6 (six) hours as needed for severe pain.  . rosuvastatin (CRESTOR) 5 MG tablet TAKE 1 TABLET BY MOUTH AT BEDTIME  . insulin aspart (NOVOLOG FLEXPEN) 100 UNIT/ML FlexPen Inject 10-16 Units into the skin 3 (three) times daily with meals.   No facility-administered encounter medications on file as of 10/17/2017.    ALLERGIES: Allergies  Allergen Reactions  . Codeine   . Contrast Media [Iodinated Diagnostic Agents]    VACCINATION STATUS:  There is no immunization history on file for this patient.  HPI   67 yr old male who underwent total thyroidectomy in [place] on June 02, 2012. 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 1.7 cm cancer nodule on the right lobe and 0.4 cm cancer nodule on the left lobe. Partial invasion of capsule, margins negative. He underwent thyrogen stimulated I131  thyroid remnant ablation with negative whole body scan for distant metastasis on July 17, 2013.  His May 2014 thyroid /neck ultrasound was negative for any mass lesion.  His follow up Thyrogen stimulated WBS on 10/25/13 was c/w successful ablation of thyroid remnants with no evidence of local recurrence or metastatic disease. He has  had another thyroid ultrasound  on 10/11/14 which was negative for residual thyroid tissue. On his next  surveillance thyroid ultrasound on September 6,2016 however he has had a 9 mm residual tissue on the  right thyroid bed, which grew to 12 mm in follow-up ultrasound in December 2016. He was sent to [facility] where he had his prior surgery. Fine needle  aspiration of the residual tissue did not reveal diagnostic material.  - On thyroid/neck ultrasound  on 02/01/2016 showed that the remnant thyroid tissue decreased in size to 1.0 cm, all other  findings unchanged.  -  His last  Thyrogen stimulated whole-body scan on  06/21/2016 was  reported to be negative for any iodine avid metastatic thyroid cancer.  - On 06/27/2017 : His previsit ultrasound at an outside facility in [facility] revealed an echogenic focus measuring 1.7cm x 0.7cm x 1.1 cm ( previously 1 cm-1.2 cm in [facility] on 02/01/2016) potentially representing a small amount of residual thyroid tissue versus small lymph node. There is no identifiable tissue on the left, no identifiable isthmus.  -He pushed to get his previsit ultrasound prematurely on August 08, 2017 which showed no significant change in the thyroid remnant at 1.2 cm.    He is currently on Synthroid 150 mcg po qam. He reports compliance to this medication.   He is also being treated for type 2 DM , not very compliant to this diagnosis, he struggled to catch up with required monitoring of blood glucose.  He is on basal insulin of Levemir 60 units at bedtime, returns with higher A1c of 10%.  Is also on metformin 1000 mg p.o. twice daily.   -He reports polyuria, polydipsia.  Review of Systems  Constitutional:  +steady  Weight gain ,  + fatigue, no subjective hyperthermia/hypothermia Eyes: no blurry vision, no xerophthalmia ENT: no sore throat, no nodules palpated in throat, no dysphagia, odynophagia, change in voice. Cardiovascular: no CP/SOB/palpitations/leg swelling Respiratory: no cough/SOB Gastrointestinal: no N/V/D, + constipation Musculoskeletal: no muscle/joint aches, + back pain Skin: no rashes Neurological: no tremors/numbness/tingling/dizziness Psychiatric: no depression/anxiety   Objective:    BP (!) 157/85   Pulse 78   Ht '5\' 8"'$  (1.727 m)   Wt 206 lb 6.4 oz (93.6 kg)   BMI 31.38 kg/m   Wt Readings from Last 3 Encounters:  10/17/17 206 lb 6.4 oz (93.6 kg)  07/18/17 201 lb (91.2 kg)  04/08/17 201 lb (91.2 kg)    Physical Exam  Constitutional: + Obese, not in acute  distress. Eyes: PERRLA, EOMI, no exophthalmos ENT: moist mucous membranes, +thyroidectomy scar , no cervical lymphadenopathy Cardiovascular: RRR, No MRG Respiratory: CTA B Gastrointestinal: abdomen soft, NT, ND, BS+ Musculoskeletal: no deformities, strength intact in all 4 Skin: moist, warm, no rashes Neurological: no tremor with outstretched hands, DTR normal in all 4   Recent Results (from the past 2160 hour(s))  COMPLETE METABOLIC PANEL WITH GFR     Status: Abnormal   Collection Time: 10/08/17  9:12 AM  Result Value Ref Range   Glucose, Bld 223 (H) 65 - 99 mg/dL    Comment: .            Fasting reference interval . For someone without known diabetes, a glucose value >125 mg/dL indicates that they may have diabetes and this should be confirmed with a follow-up test. .    BUN 17 7 - 25 mg/dL   Creat 1.13 0.70 - 1.25 mg/dL    Comment: For patients >49 years  of age, the reference limit for Creatinine is approximately 13% higher for people identified as African-American. .    GFR, Est Non African American 67 > OR = 60 mL/min/1.34m   GFR, Est African American 78 > OR = 60 mL/min/1.752m  BUN/Creatinine Ratio NOT APPLICABLE 6 - 22 (calc)   Sodium 140 135 - 146 mmol/L   Potassium 4.2 3.5 - 5.3 mmol/L   Chloride 103 98 - 110 mmol/L   CO2 28 20 - 32 mmol/L   Calcium 9.6 8.6 - 10.3 mg/dL   Total Protein 6.9 6.1 - 8.1 g/dL   Albumin 4.3 3.6 - 5.1 g/dL   Globulin 2.6 1.9 - 3.7 g/dL (calc)   AG Ratio 1.7 1.0 - 2.5 (calc)   Total Bilirubin 0.5 0.2 - 1.2 mg/dL   Alkaline phosphatase (APISO) 67 40 - 115 U/L   AST 18 10 - 35 U/L   ALT 26 9 - 46 U/L  Hemoglobin A1c     Status: Abnormal   Collection Time: 10/08/17  9:12 AM  Result Value Ref Range   Hgb A1c MFr Bld 10.0 (H) <5.7 % of total Hgb    Comment: For someone without known diabetes, a hemoglobin A1c value of 6.5% or greater indicates that they may have  diabetes and this should be confirmed with a follow-up   test. . For someone with known diabetes, a value <7% indicates  that their diabetes is well controlled and a value  greater than or equal to 7% indicates suboptimal  control. A1c targets should be individualized based on  duration of diabetes, age, comorbid conditions, and  other considerations. . Currently, no consensus exists regarding use of hemoglobin A1c for diagnosis of diabetes for children. .    Mean Plasma Glucose 240 (calc)   eAG (mmol/L) 13.3 (calc)  T4, free     Status: None   Collection Time: 10/08/17  9:12 AM  Result Value Ref Range   Free T4 1.5 0.8 - 1.8 ng/dL  TSH     Status: None   Collection Time: 10/08/17  9:12 AM  Result Value Ref Range   TSH 1.02 0.40 - 4.50 mIU/L     Thyrogen stimulated whole-body scan on 06/21/2016: IMPRESSION: Negative whole-body I 131 scan for recurrent iodine avid thyroid Cancer.  06/14/2016 thyroglobulin antibodies negative, thyroglobulin by IME less than 0.1  06/27/2017 - [facility] 1.7cm x 0.7cm x 1.1 cm ( [facility]) potentially representing a small amount of residual thyroid tissue versus small lymph node. There is no identifiable tissue on the left, no identifiable isthmus.  [facility]  FINDINGS: Residual solid nodular isoechoic tissue in the right thyroid noted measures 1.2 x 1.0 x 1.0 cm, previously 1.0 x 0.7 x 0.8 cm. No significant interval change. No new abnormality.   Assessment & Plan:   1.  [facility]  Patient is s/p total thyroidectomy for stage 1 ( T1bNoMx) PTC , multicentric ( 1.7 cm on right lobe and 0.4cms in the left lobe). on July 17, 2012, thyrogen stimulated RAI thyroid remnant ablation given his BRAF positive presentation.  The post therapy scan was negative for  distant metastasis. His May 2014 thyroid ultrasound is negative for remnant tissue.  Subsequent surveillance thyrogen stimulated WBS on 10/25/13 was c/w successful ablation of  thyroid remnants, with no evidence of local recurrence nor metastatic disease.  His repeat thyroid ultrasound on 10/11/14 showed no residual thyroid tissue. However, on his last surveillance thyroid/neck ultrasound she was found to have a new 65m residual/recurrent soft tissue nodule in the right  thyroidectomy bed. On subsequent surveillance thyroid ultrasound this tissue was found to be larger at 12 mm. Attempt to biopsy this up to [facility] was not diagnostic.   - On thyroid/neck ultrasound before this visit on 02/01/2016 showed that the remnant thyroid tissue decreased in size to 1.0 cm, all other  findings unchanged.  There was no sign of residual thyroid tissue on whole-body scan performed 2015.  She is thyroglobulin by IMA was less than 0.1 associated with negative thyroglobulin antibodies on 06/14/2016.  - Thyrogen stimulated whole-body scan on 06/21/2016 was negative for iodine avid metastatic thyroid cancer.   - Most recent thyroid/neck ultrasound on 06/27/2017 showed persistence of possibly thyroid tissue in the right thyroid lobe bed- this was done within a different facility than his prior imaging studies-subject to variations of measurements.   -His previsit thyroid/neck ultrasound was done on August 08, 2017 prematurely due to his insistence. -The report was that there was no major interval change in the thyroid remnant on the right thyroid bed.  -[facility].   if the residual tissue changes significantly or continues to grow, he would be considered for core biopsy.  2. Postsurgical hypothyroidism -His labs are consistent with appropriate replacement.  I advised him to continue levothyroxine at 150 mcg p.o.  every morning.   - We discussed about correct intake of levothyroxine, at fasting, with water, separated by at least 30 minutes from breakfast, and separated by more than 4 hours from calcium, iron, multivitamins, acid reflux medications (PPIs). -Patient is made aware of the fact that thyroid hormone replacement is needed for life, dose to be adjusted by periodic monitoring of thyroid function tests.  3. Uncontrolled type 2 diabetes mellitus with complication, with long-term current use of insulin ([place] - He has a history of noncompliance.  His A1c is increasing to 10% at this time.  - I have approached him for intensive treatment with basal/bolus insulin, reluctantly accepts.    -I advised him to continue Levemir at 60 units nightly, discussed and initiated NovoLog 10 units 3 times daily before meals for pre-meal blood glucose readings above 90 mg/dL, plus some correction for readings above 150 mg/dL.    - He is urged to continue monitoring blood glucose times a day-before meals and at bedtime.   -He will continue metformin 1000 mg p.o. twice daily.   4. Hyperlipidemia: Uncontrolled  He is on crestor 5 mg po qhs, LDL is high at 126.  5. Essential hypertension: Uncontrolled . - I urged him to be consistent on his medications including ACEi. He will continue on Lisinopril 20 HCTZ 12.5  mg po qday.  I advised patient to maintain close follow up with his PCP for primary care needs.  - Time spent with the patient: 25 min, of which >50% was spent in reviewing his blood glucose logs , discussing his hypo- and hyper-glycemic episodes, reviewing his current and  previous labs and insulin doses and developing a plan to avoid hypo- and hyper-glycemia. Please refer to Patient Instructions for Blood Glucose Monitoring and Insulin/Medications Dosing Guide"  in media tab for additional information.  Follow up plan:  Return in about 2 weeks (around 10/31/2017) for follow up with meter and logs- no  labs.  Glade Lloyd, MD Phone: [phone]  Fax: [phone]   This note was partially dictated with voice recognition software. Similar sounding words can be transcribed inadequately or may not  be corrected upon review.  10/17/2017, 12:11 PM

## 2017-10-17 NOTE — Patient Instructions (Signed)

## 2017-10-21 ENCOUNTER — Other Ambulatory Visit: Payer: Self-pay

## 2017-10-21 MED ORDER — INSULIN LISPRO 100 UNIT/ML (KWIKPEN): 10.0000 [IU] | PEN_INJECTOR | Freq: Three times a day (TID) | SUBCUTANEOUS | 0 refills | Status: DC

## 2017-11-04 ENCOUNTER — Ambulatory Visit (INDEPENDENT_AMBULATORY_CARE_PROVIDER_SITE_OTHER): Payer: BLUE CROSS/BLUE SHIELD | Admitting: "Endocrinology

## 2017-11-04 ENCOUNTER — Encounter: Payer: Self-pay | Admitting: "Endocrinology

## 2017-11-04 VITALS — BP 125/78 | HR 67 | Ht 68.0 in | Wt 201.0 lb

## 2017-11-04 DIAGNOSIS — E890 Postprocedural hypothyroidism: Secondary | ICD-10-CM

## 2017-11-04 DIAGNOSIS — IMO0002 Reserved for concepts with insufficient information to code with codable children: Secondary | ICD-10-CM

## 2017-11-04 DIAGNOSIS — C73 Malignant neoplasm of thyroid gland: Secondary | ICD-10-CM

## 2017-11-04 DIAGNOSIS — E1165 Type 2 diabetes mellitus with hyperglycemia: Secondary | ICD-10-CM | POA: Diagnosis not present

## 2017-11-04 DIAGNOSIS — E118 Type 2 diabetes mellitus with unspecified complications: Secondary | ICD-10-CM

## 2017-11-04 DIAGNOSIS — I10 Essential (primary) hypertension: Secondary | ICD-10-CM

## 2017-11-04 DIAGNOSIS — Z794 Long term (current) use of insulin: Secondary | ICD-10-CM

## 2017-11-04 MED ORDER — FREESTYLE LIBRE 14 DAY READER DEVI: 1.0000 | Freq: Once | 0 refills | Status: AC

## 2017-11-04 MED ORDER — FREESTYLE LIBRE 14 DAY SENSOR MISC: 1.0000 | 2 refills | Status: DC

## 2017-11-04 MED ORDER — INSULIN DETEMIR 100 UNIT/ML FLEXPEN: 70.0000 [IU] | PEN_INJECTOR | Freq: Every day | SUBCUTANEOUS | 2 refills | Status: DC

## 2017-11-04 NOTE — Progress Notes (Signed)
Endocrinology follow-up note  Subjective:    Patient ID: Dean Hurley, male    DOB: Mar 04, 1951,    Past Medical History:  Diagnosis Date  . Cancer ([place])   . Diabetes mellitus without complication ([place])   . Hypertension   . Thyroid cancer [facility])    Past Surgical History:  Procedure Laterality Date  . BACK SURGERY    . THYROID SURGERY     Thyroidectomy  . TRANSURETHRAL RESECTION OF PROSTATE     Social History   Socioeconomic History  . Marital status: Married    Spouse name: Not on file  . Number of children: Not on file  . Years of education: Not on file  . Highest education level: Not on file  Occupational History  . Not on file  Social Needs  . Financial resource strain: Not on file  . Food insecurity:    Worry: Not on file    Inability: Not on file  . Transportation needs:    Medical: Not on file    Non-medical: Not on file  Tobacco Use  . Smoking status: Former Research scientist (life sciences)  . Smokeless tobacco: Never Used  Substance and Sexual Activity  . Alcohol use: No    Alcohol/week: 0.0 oz  . Drug use: No  . Sexual activity: Not on file  Lifestyle  . Physical activity:    Days per week: Not on file    Minutes per session: Not on file  . Stress: Not on file  Relationships  . Social connections:    Talks on phone: Not on file    Gets together: Not on file    Attends religious service: Not on file    Active member of club or organization: Not on file    Attends meetings of clubs or organizations: Not on file    Relationship status: Not on file  Other Topics Concern  . Not on file  Social History Narrative  . Not on file   Outpatient Encounter Medications as of 11/04/2017  Medication Sig  . ACCU-CHEK FASTCLIX LANCETS MISC 1 each by Does not apply route 2 (two) times daily.  . B-D ULTRAFINE III SHORT PEN 31G X 8 MM MISC USE AS DIRECTED  . Continuous Blood Gluc Receiver (FREESTYLE LIBRE 14 DAY READER) DEVI 1 each by Does not apply route  once for 1 dose.  . Continuous Blood Gluc Sensor (FREESTYLE LIBRE 14 DAY SENSOR) MISC Inject 1 each into the skin every 14 (fourteen) days. Use as directed.  Marland Kitchen glucose blood (ACCU-CHEK GUIDE) test strip Use as directed 4 x daily. e11.65  . Insulin Detemir (LEVEMIR FLEXTOUCH) 100 UNIT/ML Pen Inject 70 Units into the skin at bedtime. INJECT 60 UNITS SUBCUTANEOUSLY AT 10PM  . insulin lispro (HUMALOG) 100 UNIT/ML KiwkPen Inject 0.1-0.16 mLs (10-16 Units total) into the skin 3 (three) times daily before meals.  Marland Kitchen levothyroxine (SYNTHROID, LEVOTHROID) 150 MCG tablet TAKE 1 TABLET BY MOUTH EVERY MORNING BEFORE BREAKFAST  . lisinopril-hydrochlorothiazide (PRINZIDE,ZESTORETIC) 20-12.5 MG tablet TAKE ONE TABLET BY MOUTH DAILY , NEEDS APPOINTMENT  . meclizine (ANTIVERT) 25 MG tablet Take 25 mg by mouth 3 (three) times daily as needed for dizziness.  . metFORMIN (GLUCOPHAGE) 1000 MG tablet TAKE 1 TABLET BY MOUTH TWICE A DAY WITH A MEAL  . oxyCODONE-acetaminophen (PERCOCET/ROXICET) 5-325 MG tablet Take 1 tablet by mouth  every 6 (six) hours as needed for severe pain.  . rosuvastatin (CRESTOR) 5 MG tablet TAKE 1 TABLET BY MOUTH AT BEDTIME  . [DISCONTINUED] insulin aspart (NOVOLOG FLEXPEN) 100 UNIT/ML FlexPen Inject 10-16 Units into the skin 3 (three) times daily with meals.  . [DISCONTINUED] Insulin Detemir (LEVEMIR FLEXTOUCH) 100 UNIT/ML Pen Inject 60 Units into the skin at bedtime. INJECT 60 UNITS SUBCUTANEOUSLY AT 10PM  . [DISCONTINUED] LEVEMIR FLEXTOUCH 100 UNIT/ML Pen INJECT 60 UNITS SUBCUTANEOUSLY DAILY AT 10PM   No facility-administered encounter medications on file as of 11/04/2017.    ALLERGIES: Allergies  Allergen Reactions  . Codeine   . Contrast Media [Iodinated Diagnostic Agents]    VACCINATION STATUS:  There is no immunization history on file for this patient.  HPI   67 yr old male who underwent total thyroidectomy in [facility] on June 02, 2012. His total  thyroidectomy showed BRAF positive multicentric thyroid cancer. 1.7 cm cancer nodule on the right lobe and 0.4 cm cancer nodule on the left lobe. Partial invasion of capsule, margins negative. He underwent thyrogen stimulated I131  thyroid remnant ablation with negative whole body scan for distant metastasis on July 17, 2013.  His May 2014 thyroid /neck ultrasound was negative for any mass lesion.  His follow up Thyrogen stimulated WBS on 10/25/13 was c/w successful ablation of thyroid remnants with no evidence of local recurrence or metastatic disease. He has  had another thyroid ultrasound on 10/11/14 which was negative for residual thyroid tissue. On his next  surveillance thyroid ultrasound on September 6,2016 however he has had a 9 mm residual tissue on the  right thyroid bed, which grew to 12 mm in follow-up ultrasound in December 2016. He was sent to [facility] where he had his prior surgery. Fine needle  aspiration of the residual tissue did not reveal diagnostic material.  - On thyroid/neck ultrasound  on 02/01/2016 showed that the remnant thyroid tissue decreased in size to 1.0 cm, all other  findings unchanged.  - His last  Thyrogen stimulated whole-body scan on  06/21/2016 was  reported to be negative for any iodine avid metastatic thyroid cancer.  - On 06/27/2017 : His previsit ultrasound at an outside facility in [facility] revealed an echogenic focus measuring 1.7cm x 0.7cm x 1.1 cm ( previously 1 cm-1.2 cm in [facility] imaging on 02/01/2016) potentially representing a small amount of residual thyroid tissue versus small lymph node. There is no identifiable tissue on the left, no identifiable isthmus.  -He pushed to get his previsit ultrasound prematurely on August 08, 2017 which showed no significant change in the thyroid remnant at 1.2 cm.  -He is scheduled to have a repeat surveillance thyroid/neck ultrasound in June 2019.  He is currently on Synthroid 150 mcg po  qam. He reports compliance to this medication.   He is also being treated for type 2 DM , not very compliant to this diagnosis, he struggled to catch up with required monitoring of blood glucose.  Due to a higher A1c at 10%, during his last visit he was initiated on intensive treatment with basal/bolus insulin; Levemir 60 units nightly and Humalog 10-16 units 3 times daily before meals.  He returns with better blood glucose profile, still above target.  No documented or reported hypoglycemia.  He is also on metformin 1000 mg p.o. twice daily.    Review of Systems  Constitutional:  + steady weight gain,   + fatigue, no subjective hyperthermia  nor hyponatremia.    Eyes: no blurry vision, no xerophthalmia ENT: no sore throat, no nodules palpated in throat, no dysphagia, odynophagia, change in voice. Cardiovascular: No chest pain, no palpitations. Respiratory: no cough/SOB Gastrointestinal: no N/V/D, + constipation Musculoskeletal: no muscle/joint aches, + back pain Skin: no rashes Neurological: Denies tremors, numbness, tingling.   Psychiatric: no depression/anxiety   Objective:    BP 125/78   Pulse 67   Ht _0  (1.727 m)   Wt 201 lb (91.2 kg)   BMI 30.56 kg/m   Wt Readings from Last 3 Encounters:  11/04/17 201 lb (91.2 kg)  10/17/17 206 lb 6.4 oz (93.6 kg)  07/18/17 201 lb (91.2 kg)    Physical Exam  Constitutional: + Obese, alert and oriented x3, not in acute distress.  Eyes: PERRLA, EOMI, no exophthalmos ENT: moist mucous membranes, +thyroidectomy scar , no cervical mass or lymphadenopathy.  Musculoskeletal: no deformities, strength intact in all 4 Skin: moist, warm, no rashes Neurological: No tremors of outstretched hands.     Recent Results (from the past 2160 hour(s))  COMPLETE METABOLIC PANEL WITH GFR     Status: Abnormal   Collection Time: 10/08/17  9:12 AM  Result Value Ref Range   Glucose, Bld 223 (H) 65 - 99 mg/dL    Comment: .            Fasting reference  interval . For someone without known diabetes, a glucose value >125 mg/dL indicates that they may have diabetes and this should be confirmed with a follow-up test. .    BUN 17 7 - 25 mg/dL   Creat 1.13 0.70 - 1.25 mg/dL    Comment: For patients >19 years of age, the reference limit for Creatinine is approximately 13% higher for people identified as African-American. .    GFR, Est Non African American 67 > OR = 60 mL/min/1.47m   GFR, Est African American 78 > OR = 60 mL/min/1.776m  BUN/Creatinine Ratio NOT APPLICABLE 6 - 22 (calc)   Sodium 140 135 - 146 mmol/L   Potassium 4.2 3.5 - 5.3 mmol/L   Chloride 103 98 - 110 mmol/L   CO2 28 20 - 32 mmol/L   Calcium 9.6 8.6 - 10.3 mg/dL   Total Protein 6.9 6.1 - 8.1 g/dL   Albumin 4.3 3.6 - 5.1 g/dL   Globulin 2.6 1.9 - 3.7 g/dL (calc)   AG Ratio 1.7 1.0 - 2.5 (calc)   Total Bilirubin 0.5 0.2 - 1.2 mg/dL   Alkaline phosphatase (APISO) 67 40 - 115 U/L   AST 18 10 - 35 U/L   ALT 26 9 - 46 U/L  Hemoglobin A1c     Status: Abnormal   Collection Time: 10/08/17  9:12 AM  Result Value Ref Range   Hgb A1c MFr Bld 10.0 (H) <5.7 % of total Hgb    Comment: For someone without known diabetes, a hemoglobin A1c value of 6.5% or greater indicates that they may have  diabetes and this should be confirmed with a follow-up  test. . For someone with known diabetes, a value <7% indicates  that their diabetes is well controlled and a value  greater than or equal to 7% indicates suboptimal  control. A1c targets should be individualized based on  duration of diabetes, age, comorbid conditions, and  other considerations. . Currently, no consensus exists regarding use of hemoglobin A1c for diagnosis of diabetes for children. .    Mean Plasma Glucose 240 (calc)  eAG (mmol/L) 13.3 (calc)  T4, free     Status: None   Collection Time: 10/08/17  9:12 AM  Result Value Ref Range   Free T4 1.5 0.8 - 1.8 ng/dL  TSH     Status: None   Collection Time:  10/08/17  9:12 AM  Result Value Ref Range   TSH 1.02 0.40 - 4.50 mIU/L     Thyrogen stimulated whole-body scan on 06/21/2016: IMPRESSION: Negative whole-body I 131 scan for recurrent iodine avid thyroid Cancer.  06/14/2016 thyroglobulin antibodies negative, thyroglobulin by IME less than 0.1  06/27/2017 - His previsit ultrasound at an outside facility in [facility] revealed an echogenic focus measuring 1.7cm x 0.7cm x 1.1 cm ( previously 1 cm-1.2 cm in [facility] imaging on 02/01/2016) potentially representing a small amount of residual thyroid tissue versus small lymph node. There is no identifiable tissue on the left, no identifiable isthmus.  August 08, 2018 thyroid/neck ultrasound in [facility]:   FINDINGS: Residual solid nodular isoechoic tissue in the right thyroid noted measures 1.2 x 1.0 x 1.0 cm, previously 1.0 x 0.7 x 0.8 cm. No significant interval change. No new abnormality.   Assessment & Plan:   1.  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 on July 17, 2012, thyrogen stimulated RAI thyroid remnant ablation given his BRAF positive presentation.  The post therapy scan was negative for distant metastasis. His May 2014 thyroid ultrasound is negative for remnant tissue.  Subsequent surveillance thyrogen stimulated WBS on 10/25/13 was c/w successful ablation of thyroid remnants, with no evidence of local recurrence nor metastatic disease.  His repeat thyroid ultrasound on 10/11/14 showed no residual thyroid tissue. However, on his last surveillance thyroid/neck ultrasound she was found to have a new 6m residual/recurrent soft tissue nodule in the right  thyroidectomy bed. On subsequent surveillance thyroid ultrasound this tissue was found to be larger at 12 mm. Attempt to biopsy this up to [facility] was not diagnostic.   -  On thyroid/neck ultrasound before this visit on 02/01/2016 showed that the remnant thyroid tissue decreased in size to 1.0 cm, all other  findings unchanged.  There was no sign of residual thyroid tissue on whole-body scan performed 2015.  She is thyroglobulin by IMA was less than 0.1 associated with negative thyroglobulin antibodies on 06/14/2016.  - Thyrogen stimulated whole-body scan on 06/21/2016 was negative for iodine avid metastatic thyroid cancer.   - Most recent thyroid/neck ultrasound on 06/27/2017 showed persistence of possibly thyroid tissue in the right thyroid lobe bed- this was done within a different facility than his prior imaging studies-subject to variations of measurements.   -His previsit thyroid/neck ultrasound was done on August 08, 2017 prematurely due to his insistence. -The report was that there was no major interval change in the thyroid remnant on the right thyroid bed.  -[facility].   if the residual tissue changes significantly or continues to grow, he would be considered for core biopsy.  2. Postsurgical hypothyroidism -His labs are consistent with appropriate replacement.  -I advised him to continue Synthroid at 150 mcg p.o. every morning.   - We discussed about correct intake of levothyroxine, at fasting, with water, separated by at least 30 minutes from breakfast, and separated  by more than 4 hours from calcium, iron, multivitamins, acid reflux medications (PPIs). -Patient is made aware of the fact that thyroid hormone replacement is needed for life, dose to be adjusted by periodic monitoring of thyroid function tests.   3. Uncontrolled type 2 diabetes mellitus with complication, with long-term current use of insulin ([place]) - He has a history of noncompliance.  His recent A1c is increasing to 10% . -He will continue to require intensive treatment with  basal/bolus insulin.  -I advised him to increase Levemir to 70 units nightly, continue Humalog  10 units 3 times daily before meals for pre-meal blood glucose readings above 90 mg/dL, plus patient specific correction for readings above 150 mg/dL.    - He is urged to continue monitoring blood glucose times a day-before meals and at bedtime.   -He will benefit from continuous glucose monitoring.  I discussed and initiated a prescription for the [facility] device for him. -He will continue metformin 1000 mg p.o. twice daily.  4. Hyperlipidemia: Uncontrolled  He is on crestor 5 mg po qhs, LDL is high at 126.  5. Essential hypertension: Uncontrolled . - I urged him to be consistent on his medications including ACEi. He will continue on Lisinopril 20 HCTZ 12.5  mg po qday.  I advised patient to maintain close follow up with his PCP for primary care needs.  - Time spent with the patient: 25 min, of which >50% was spent in reviewing his blood glucose logs , discussing his hypo- and hyper-glycemic episodes, reviewing his current and  previous labs and insulin doses and developing a plan to avoid hypo- and hyper-glycemia. Please refer to Patient Instructions for Blood Glucose Monitoring and Insulin/Medications Dosing Guide"  in media tab for additional information. Stevie Kern participated in the discussions, expressed understanding, and voiced agreement with the above plans.  All questions were answered to his satisfaction. he is encouraged to contact clinic should he have any questions or concerns prior to his return visit.   Follow up plan: Return in about 3 months (around 02/04/2018) for follow up with pre-visit labs, meter, and logs, Thyroid / Neck Ultrasound.  Glade Lloyd, MD Phone: [phone]  Fax: [phone]   This note was partially dictated with voice recognition software. Similar sounding words can be transcribed inadequately or may not  be corrected upon review.  11/04/2017,  8:47 AM

## 2017-11-04 NOTE — Patient Instructions (Signed)

## 2017-11-06 ENCOUNTER — Other Ambulatory Visit: Payer: Self-pay | Admitting: "Endocrinology

## 2017-11-07 ENCOUNTER — Other Ambulatory Visit: Payer: Self-pay

## 2017-11-07 MED ORDER — INSULIN DETEMIR 100 UNIT/ML FLEXPEN: 70.0000 [IU] | PEN_INJECTOR | Freq: Every day | SUBCUTANEOUS | 2 refills | Status: DC

## 2017-11-13 ENCOUNTER — Other Ambulatory Visit: Payer: Self-pay | Admitting: "Endocrinology

## 2017-12-15 ENCOUNTER — Other Ambulatory Visit: Payer: Self-pay

## 2017-12-15 MED ORDER — FREESTYLE LIBRE 14 DAY SENSOR MISC: 1.0000 | 2 refills | Status: DC

## 2017-12-29 ENCOUNTER — Ambulatory Visit (HOSPITAL_COMMUNITY)
Admission: RE | Admit: 2017-12-29 | Discharge: 2017-12-29 | Disposition: A | Discharge status: Home / Self Care | Payer: BLUE CROSS/BLUE SHIELD | Source: Ambulatory Visit | Attending: "Endocrinology | Admitting: "Endocrinology

## 2017-12-29 ENCOUNTER — Other Ambulatory Visit: Payer: Self-pay | Admitting: "Endocrinology

## 2017-12-29 DIAGNOSIS — C73 Malignant neoplasm of thyroid gland: Secondary | ICD-10-CM | POA: Diagnosis not present

## 2017-12-29 DIAGNOSIS — E041 Nontoxic single thyroid nodule: Secondary | ICD-10-CM | POA: Diagnosis not present

## 2018-01-12 ENCOUNTER — Telehealth: Payer: Self-pay | Admitting: "Endocrinology

## 2018-01-12 NOTE — Telephone Encounter (Signed)
Tong is requesting refill on [employer] sent to the same pharmacy as Tye [place] gets hers, please advise?

## 2018-01-13 MED ORDER — FREESTYLE LIBRE 14 DAY SENSOR MISC: 1.0000 | 2 refills | Status: DC

## 2018-01-14 ENCOUNTER — Other Ambulatory Visit: Payer: Self-pay | Admitting: "Endocrinology

## 2018-01-31 ENCOUNTER — Other Ambulatory Visit: Payer: Self-pay | Admitting: "Endocrinology

## 2018-02-03 LAB — COMPLETE METABOLIC PANEL WITH GFR
AG Ratio: 1.6 (calc) (ref 1.0–2.5)
ALT: 25 U/L (ref 9–46)
AST: 17 U/L (ref 10–35)
Albumin: 4.4 g/dL (ref 3.6–5.1)
Alkaline phosphatase (APISO): 71 U/L (ref 40–115)
BUN: 20 mg/dL (ref 7–25)
CO2: 27 mmol/L (ref 20–32)
Calcium: 9.8 mg/dL (ref 8.6–10.3)
Chloride: 102 mmol/L (ref 98–110)
Creat: 1.18 mg/dL (ref 0.70–1.25)
GFR, Est African American: 74 mL/min/{1.73_m2} (ref >=60)
GFR, Est Non African American: 64 mL/min/{1.73_m2} (ref >=60)
Globulin: 2.7 g/dL (calc) (ref 1.9–3.7)
Glucose, Bld: 163 mg/dL — ABNORMAL HIGH (ref 65–99)
Potassium: 4 mmol/L (ref 3.5–5.3)
Sodium: 139 mmol/L (ref 135–146)
Total Bilirubin: 0.5 mg/dL (ref 0.2–1.2)
Total Protein: 7.1 g/dL (ref 6.1–8.1)

## 2018-02-03 LAB — HEMOGLOBIN A1C
Hgb A1c MFr Bld: 9.1 % of total Hgb — ABNORMAL HIGH (ref <5.7)
Mean Plasma Glucose: 214 (calc)
eAG (mmol/L): 11.9 (calc)

## 2018-02-03 LAB — TSH: TSH: 11.84 mIU/L — ABNORMAL HIGH (ref 0.40–4.50)

## 2018-02-03 LAB — T4, FREE: Free T4: 1.2 ng/dL (ref 0.8–1.8)

## 2018-02-06 ENCOUNTER — Encounter: Payer: Self-pay | Admitting: "Endocrinology

## 2018-02-06 ENCOUNTER — Ambulatory Visit (INDEPENDENT_AMBULATORY_CARE_PROVIDER_SITE_OTHER): Payer: BLUE CROSS/BLUE SHIELD | Admitting: "Endocrinology

## 2018-02-06 VITALS — BP 136/80 | HR 78 | Ht 68.0 in | Wt 207.0 lb

## 2018-02-06 DIAGNOSIS — I10 Essential (primary) hypertension: Secondary | ICD-10-CM

## 2018-02-06 DIAGNOSIS — E1165 Type 2 diabetes mellitus with hyperglycemia: Secondary | ICD-10-CM

## 2018-02-06 DIAGNOSIS — C73 Malignant neoplasm of thyroid gland: Secondary | ICD-10-CM | POA: Diagnosis not present

## 2018-02-06 DIAGNOSIS — E782 Mixed hyperlipidemia: Secondary | ICD-10-CM | POA: Diagnosis not present

## 2018-02-06 DIAGNOSIS — Z794 Long term (current) use of insulin: Secondary | ICD-10-CM | POA: Diagnosis not present

## 2018-02-06 DIAGNOSIS — E118 Type 2 diabetes mellitus with unspecified complications: Secondary | ICD-10-CM

## 2018-02-06 DIAGNOSIS — IMO0002 Reserved for concepts with insufficient information to code with codable children: Secondary | ICD-10-CM

## 2018-02-06 DIAGNOSIS — E890 Postprocedural hypothyroidism: Secondary | ICD-10-CM | POA: Diagnosis not present

## 2018-02-06 MED ORDER — INSULIN DETEMIR 100 UNIT/ML FLEXPEN: PEN_INJECTOR | SUBCUTANEOUS | 2 refills | Status: DC

## 2018-02-06 MED ORDER — LEVOTHYROXINE SODIUM 175 MCG PO TABS: ORAL_TABLET | ORAL | 3 refills | Status: DC

## 2018-02-06 MED ORDER — INSULIN LISPRO 100 UNIT/ML (KWIKPEN): PEN_INJECTOR | SUBCUTANEOUS | 2 refills | Status: DC

## 2018-02-06 NOTE — Patient Instructions (Signed)

## 2018-02-06 NOTE — Progress Notes (Signed)
Endocrinology follow-up note  Subjective:    Patient ID: Dean Hurley, male    DOB: January 30, 1951,    Past Medical History:  Diagnosis Date  . Cancer ([place])   . Diabetes mellitus without complication ([place])   . Hypertension   . Thyroid cancer [facility])    Past Surgical History:  Procedure Laterality Date  . BACK SURGERY    . THYROID SURGERY     Thyroidectomy  . TRANSURETHRAL RESECTION OF PROSTATE     Social History   Socioeconomic History  . Marital status: Married    Spouse name: Not on file  . Number of children: Not on file  . Years of education: Not on file  . Highest education level: Not on file  Occupational History  . Not on file  Social Needs  . Financial resource strain: Not on file  . Food insecurity:    Worry: Not on file    Inability: Not on file  . Transportation needs:    Medical: Not on file    Non-medical: Not on file  Tobacco Use  . Smoking status: Former Research scientist (life sciences)  . Smokeless tobacco: Never Used  Substance and Sexual Activity  . Alcohol use: No    Alcohol/week: 0.0 oz  . Drug use: No  . Sexual activity: Not on file  Lifestyle  . Physical activity:    Days per week: Not on file    Minutes per session: Not on file  . Stress: Not on file  Relationships  . Social connections:    Talks on phone: Not on file    Gets together: Not on file    Attends religious service: Not on file    Active member of club or organization: Not on file    Attends meetings of clubs or organizations: Not on file    Relationship status: Not on file  Other Topics Concern  . Not on file  Social History Narrative  . Not on file   Outpatient Encounter Medications as of 02/06/2018  Medication Sig  . ACCU-CHEK FASTCLIX LANCETS MISC 1 each by Does not apply route 2 (two) times daily.  Marland Kitchen ACCU-CHEK GUIDE test strip USE AS DIRECTED TO TEST 4 TIMES A DAY  . B-D ULTRAFINE III SHORT PEN 31G X 8 MM MISC USE AS DIRECTED  . Continuous Blood Gluc Sensor  (FREESTYLE LIBRE 14 DAY SENSOR) MISC Inject 1 each into the skin every 14 (fourteen) days. Use as directed.  . Insulin Detemir (LEVEMIR FLEXTOUCH) 100 UNIT/ML Pen INJECT 80 UNITS SUBCUTANEOUSLY AT BEDTIME.  . insulin lispro (HUMALOG KWIKPEN) 100 UNIT/ML KiwkPen INJECT 12-18 UNITS SUBCUTANEOUSLY 3 TIMES A DAY BEFORE MEALS  . levothyroxine (SYNTHROID, LEVOTHROID) 175 MCG tablet TAKE 1 TABLET BY MOUTH EVERY MORNING BEFORE BREAKFAST  . lisinopril-hydrochlorothiazide (PRINZIDE,ZESTORETIC) 20-12.5 MG tablet TAKE ONE TABLET BY MOUTH DAILY , NEEDS APPOINTMENT  . meclizine (ANTIVERT) 25 MG tablet Take 25 mg by mouth 3 (three) times daily as needed for dizziness.  . metFORMIN (GLUCOPHAGE) 1000 MG tablet TAKE 1 TABLET BY MOUTH TWICE A DAY WITH A MEAL  . rosuvastatin (CRESTOR) 5 MG tablet TAKE 1 TABLET BY MOUTH AT BEDTIME  . [DISCONTINUED] HUMALOG KWIKPEN 100 UNIT/ML KiwkPen INJECT 10-16 UNITS SUBCUTANEOUSLY 3 TIMES A DAY BEFORE MEALS  . [DISCONTINUED] LEVEMIR FLEXTOUCH 100 UNIT/ML Pen INJECT 70 UNITS SUBCUTANEOUSLY AT BEDTIME.  . [  DISCONTINUED] levothyroxine (SYNTHROID, LEVOTHROID) 150 MCG tablet TAKE 1 TABLET BY MOUTH EVERY MORNING BEFORE BREAKFAST  . [DISCONTINUED] oxyCODONE-acetaminophen (PERCOCET/ROXICET) 5-325 MG tablet Take 1 tablet by mouth every 6 (six) hours as needed for severe pain.   No facility-administered encounter medications on file as of 02/06/2018.    ALLERGIES: Allergies  Allergen Reactions  . Codeine   . Contrast Media [Iodinated Diagnostic Agents]    VACCINATION STATUS:  There is no immunization history on file for this patient.  HPI   67 yr old male who underwent total thyroidectomy in [facility] on June 02, 2012. 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 1.7 cm cancer nodule on the right lobe and 0.4 cm cancer nodule on the left lobe. Partial invasion of capsule, margins negative. He underwent thyrogen stimulated I131   thyroid remnant ablation with negative whole body scan for distant metastasis on July 17, 2013.  His May 2014 thyroid /neck ultrasound was negative for any mass lesion.  His follow up Thyrogen stimulated WBS on 10/25/13 was c/w successful ablation of thyroid remnants with no evidence of local recurrence or metastatic disease. He has  had another thyroid ultrasound on 10/11/14 which was negative for residual thyroid tissue. On his next  surveillance thyroid ultrasound on September 6,2016 however he has had a 9 mm residual tissue on the  right thyroid bed, which grew to 12 mm in follow-up ultrasound in December 2016. He was sent to [facility] where he had his prior surgery. Fine needle  aspiration of the residual tissue did not reveal diagnostic material.  - On thyroid/neck ultrasound  on 02/01/2016 showed that the remnant thyroid tissue decreased in size to 1.0 cm, all other  findings unchanged.  - His last  Thyrogen stimulated whole-body scan on  06/21/2016 was  reported to be negative for any iodine avid metastatic thyroid cancer.  - On 06/27/2017 : His previsit ultrasound at an outside facility in [facility] revealed an echogenic focus measuring 1.7cm x 0.7cm x 1.1 cm ( previously 1 cm-1.2 cm in [facility] imaging on 02/01/2016) potentially representing a small amount of residual thyroid tissue versus small lymph node. There is no identifiable tissue on the left, no identifiable isthmus.  -He pushed to get his previsit ultrasound prematurely on August 08, 2017 which showed no significant change in the thyroid remnant at 1.2 cm.  -His most recent  repeat surveillance thyroid/neck ultrasound on February 02, 2018 is unchanged showing stable 1.2 cm nodular tissue ithin the right lobectomy bed, unchanged since December 2016.    He is currently on Synthroid 150 mcg po qam. He reports compliance to this medication.   He is also being treated for type 2 DM , not very compliant to this  diagnosis, he struggled to catch up with required monitoring of blood glucose.  Due to a higher A1c at 10%, during his last visit he was initiated on intensive treatment with basal/bolus insulin; Levemir 70 units nightly and Humalog 10-16 units 3 times daily before meals.  He returns with better blood glucose profile, still above target. His a1c is better at 9.1%. No hypoglycemia reported nor documented on his CGM device.    He is also on metformin 1000 mg p.o. twice daily.    Review of Systems  Constitutional:  + fluctuating weight ,   + fatigue, no subjective hyperthermia nor hyponatremia.    Eyes: no blurry vision, no xerophthalmia ENT: no sore throat,  S/p  thyroidectomy, no dysphagia, odynophagia, change in voice. Cardiovascular: No chest pain, no palpitations. Respiratory: no cough/SOB Gastrointestinal: no N/V/D, + constipation Musculoskeletal: no muscle/joint aches, + back pain Skin: no rashes Neurological: Denies tremors, numbness, tingling.   Psychiatric: no depression/anxiety   Objective:    BP 136/80   Pulse 78   Ht 5' 8" (1.727 m)   Wt 207 lb (93.9 kg)   BMI 31.47 kg/m   Wt Readings from Last 3 Encounters:  02/06/18 207 lb (93.9 kg)  11/04/17 201 lb (91.2 kg)  10/17/17 206 lb 6.4 oz (93.6 kg)    Physical Exam  Constitutional: + Obese, alert and oriented x3, not in acute distress.  Eyes: PERRLA, EOMI, no exophthalmos ENT: moist mucous membranes, +thyroidectomy scar , no cervical mass or lymphadenopathy.  Musculoskeletal: no deformities, strength intact in all 4 Skin: moist, warm, no rashes Neurological: No tremors of outstretched hands.     Recent Results (from the past 2160 hour(s))  COMPLETE METABOLIC PANEL WITH GFR     Status: Abnormal   Collection Time: 02/02/18 12:12 PM  Result Value Ref Range   Glucose, Bld 163 (H) 65 - 99 mg/dL    Comment: .            Fasting reference interval . For someone without known diabetes, a glucose value >125 mg/dL  indicates that they may have diabetes and this should be confirmed with a follow-up test. .    BUN 20 7 - 25 mg/dL   Creat 1.18 0.70 - 1.25 mg/dL    Comment: For patients >62 years of age, the reference limit for Creatinine is approximately 13% higher for people identified as African-American. .    GFR, Est Non African American 64 > OR = 60 mL/min/1.21m   GFR, Est African American 74 > OR = 60 mL/min/1.767m  BUN/Creatinine Ratio NOT APPLICABLE 6 - 22 (calc)   Sodium 139 135 - 146 mmol/L   Potassium 4.0 3.5 - 5.3 mmol/L   Chloride 102 98 - 110 mmol/L   CO2 27 20 - 32 mmol/L   Calcium 9.8 8.6 - 10.3 mg/dL   Total Protein 7.1 6.1 - 8.1 g/dL   Albumin 4.4 3.6 - 5.1 g/dL   Globulin 2.7 1.9 - 3.7 g/dL (calc)   AG Ratio 1.6 1.0 - 2.5 (calc)   Total Bilirubin 0.5 0.2 - 1.2 mg/dL   Alkaline phosphatase (APISO) 71 40 - 115 U/L   AST 17 10 - 35 U/L   ALT 25 9 - 46 U/L  Hemoglobin A1c     Status: Abnormal   Collection Time: 02/02/18 12:12 PM  Result Value Ref Range   Hgb A1c MFr Bld 9.1 (H) <5.7 % of total Hgb    Comment: For someone without known diabetes, a hemoglobin A1c value of 6.5% or greater indicates that they may have  diabetes and this should be confirmed with a follow-up  test. . For someone with known diabetes, a value <7% indicates  that their diabetes is well controlled and a value  greater than or equal to 7% indicates suboptimal  control. A1c targets should be individualized based on  duration of diabetes, age, comorbid conditions, and  other considerations. . Currently, no consensus exists regarding use of hemoglobin A1c for diagnosis of diabetes for children. .    Mean Plasma Glucose 214 (calc)   eAG (mmol/L) 11.9 (calc)  TSH     Status: Abnormal   Collection Time: 02/02/18 12:12 PM  Result  Value Ref Range   TSH 11.84 (H) 0.40 - 4.50 mIU/L  T4, free     Status: None   Collection Time: 02/02/18 12:12 PM  Result Value Ref Range   Free T4 1.2 0.8 - 1.8  ng/dL     Thyrogen stimulated whole-body scan on 06/21/2016: IMPRESSION: Negative whole-body I 131 scan for recurrent iodine avid thyroid Cancer.  06/14/2016 thyroglobulin antibodies negative, thyroglobulin by IME less than 0.1  06/27/2017 - His previsit ultrasound at an outside facility in [facility] revealed an echogenic focus measuring 1.7cm x 0.7cm x 1.1 cm ( previously 1 cm-1.2 cm in [facility] imaging on 02/01/2016) potentially representing a small amount of residual thyroid tissue versus small lymph node. There is no identifiable tissue on the left, no identifiable isthmus.  August 08, 2018 thyroid/neck ultrasound in [facility]:   FINDINGS: Residual solid nodular isoechoic tissue in the right thyroid noted measures 1.2 x 1.0 x 1.0 cm, previously 1.0 x 0.7 x 0.8 cm. No significant interval change. No new abnormality.  Similar ultrasound findings on February 02, 2018. See above.   Assessment & Plan:   1.  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 on July 17, 2012, thyrogen stimulated RAI thyroid remnant ablation given his BRAF positive presentation.  The post therapy scan was negative for distant metastasis. His May 2014 thyroid ultrasound is negative for remnant tissue.  Subsequent surveillance thyrogen stimulated WBS on 10/25/13 was c/w successful ablation of thyroid remnants, with no evidence of local recurrence nor metastatic disease.  His repeat thyroid ultrasound on 10/11/14 showed no residual thyroid tissue. However, on his last surveillance thyroid/neck ultrasound she was found to have a new 43m residual/recurrent soft tissue nodule in the right  thyroidectomy bed. On subsequent surveillance thyroid ultrasound this tissue was found to be larger at 12 mm. Attempt to biopsy this up to [facility] was not diagnostic.   - On  thyroid/neck ultrasound before this visit on 02/01/2016 showed that the remnant thyroid tissue decreased in size to 1.0 cm, all other  findings unchanged.  There was no sign of residual thyroid tissue on whole-body scan performed 2015.  She is thyroglobulin by IMA was less than 0.1 associated with negative thyroglobulin antibodies on 06/14/2016.  - Thyrogen stimulated whole-body scan on 06/21/2016 was negative for iodine avid metastatic thyroid cancer.   - Most recent thyroid/neck ultrasound on 06/27/2017 showed persistence of possibly thyroid tissue in the right thyroid lobe bed- this was done within a different facility than his prior imaging studies-subject to variations of measurements.   -His previsit thyroid/neck ultrasound was done on August 08, 2017 prematurely due to his insistence. -The report was that there was no major interval change in the thyroid remnant on the right thyroid bed.  - [facility] shows stable finding of 1.2 cm nodular area within the right lobectomy bed. No intervention at this time. He will be considered for thyrogen stimulated WBS in 6 months.   2. Postsurgical hypothyroidism -His labs are consistent with under- replacement.  -he reports compliance. I discussed and increased his Synthroid to 175 mcg p.o. every morning.    - We discussed about correct intake of levothyroxine, at fasting, with water, separated by at least 30 minutes from breakfast, and separated by more than  4 hours from calcium, iron, multivitamins, acid reflux medications (PPIs). -Patient is made aware of the fact that thyroid hormone replacement is needed for life, dose to be adjusted by periodic monitoring of thyroid function tests.   3. Uncontrolled type 2 diabetes mellitus with complication, with long-term current use of insulin ([place]) - He has a history of noncompliance.  His recent A1c is improving to 9.1% from  10%  . -He will continue to require intensive treatment with basal/bolus insulin.  -I advised him to increase Levemir to 80 units nightly, increase  Humalog  To 12 units 3 times daily before meals for pre-meal blood glucose readings above 90 mg/dL, plus patient specific correction for readings above 150 mg/dL.    - He is urged to wear his  CGM device at all times and to continue documenting his blood glucose at least 4 times a day-before meals and at bedtime.    -He will continue metformin 1000 mg p.o. twice daily.  4. Hyperlipidemia: Uncontrolled  He is on crestor 5 mg po qhs, LDL is high at 126.  5. Essential hypertension: His BP is controlled to target.  - I urged him to be consistent on his medications including ACEi. He will continue on Lisinopril 20 HCTZ 12.5  mg po qday.  I advised patient to maintain close follow up with his PCP for primary care needs.  - Time spent with the patient: 25 min, of which >50% was spent in reviewing his blood glucose logs , discussing his hypo- and hyper-glycemic episodes, reviewing his current and  previous labs and insulin doses and developing a plan to avoid hypo- and hyper-glycemia. Please refer to Patient Instructions for Blood Glucose Monitoring and Insulin/Medications Dosing Guide"  in media tab for additional information. Stevie Kern participated in the discussions, expressed understanding, and voiced agreement with the above plans.  All questions were answered to his satisfaction. he is encouraged to contact clinic should he have any questions or concerns prior to his return visit.    Follow up plan: Return in about 3 months (around 05/09/2018) for follow up with pre-visit labs, meter, and logs.  Glade Lloyd, MD Phone: [phone]  Fax: [phone]   This note was partially dictated with voice recognition software. Similar sounding words can be transcribed inadequately or may not  be corrected upon review.  02/06/2018, 1:48 PM

## 2018-02-07 ENCOUNTER — Other Ambulatory Visit: Payer: Self-pay | Admitting: "Endocrinology

## 2018-03-03 ENCOUNTER — Other Ambulatory Visit: Payer: Self-pay

## 2018-03-03 MED ORDER — LEVOTHYROXINE SODIUM 175 MCG PO TABS: ORAL_TABLET | ORAL | 2 refills | Status: DC

## 2018-03-03 MED ORDER — LISINOPRIL-HYDROCHLOROTHIAZIDE 20-12.5 MG PO TABS
ORAL_TABLET | ORAL | 2 refills | Status: DC
Start: 2018-03-03 — End: 2018-05-24

## 2018-04-16 ENCOUNTER — Encounter (HOSPITAL_COMMUNITY): Payer: Self-pay | Admitting: Emergency Medicine

## 2018-04-16 ENCOUNTER — Emergency Department (HOSPITAL_COMMUNITY)
Admission: EM | Admit: 2018-04-16 | Discharge: 2018-04-16 | Disposition: A | Discharge status: Home / Self Care | Payer: BLUE CROSS/BLUE SHIELD | Attending: Emergency Medicine | Admitting: Emergency Medicine

## 2018-04-16 ENCOUNTER — Other Ambulatory Visit: Payer: Self-pay

## 2018-04-16 DIAGNOSIS — Z79899 Other long term (current) drug therapy: Secondary | ICD-10-CM | POA: Insufficient documentation

## 2018-04-16 DIAGNOSIS — R21 Rash and other nonspecific skin eruption: Secondary | ICD-10-CM | POA: Diagnosis present

## 2018-04-16 DIAGNOSIS — I10 Essential (primary) hypertension: Secondary | ICD-10-CM | POA: Diagnosis not present

## 2018-04-16 DIAGNOSIS — Z87891 Personal history of nicotine dependence: Secondary | ICD-10-CM | POA: Diagnosis not present

## 2018-04-16 DIAGNOSIS — R739 Hyperglycemia, unspecified: Secondary | ICD-10-CM

## 2018-04-16 DIAGNOSIS — Z794 Long term (current) use of insulin: Secondary | ICD-10-CM | POA: Insufficient documentation

## 2018-04-16 DIAGNOSIS — E890 Postprocedural hypothyroidism: Secondary | ICD-10-CM | POA: Insufficient documentation

## 2018-04-16 DIAGNOSIS — E1165 Type 2 diabetes mellitus with hyperglycemia: Secondary | ICD-10-CM | POA: Insufficient documentation

## 2018-04-16 DIAGNOSIS — N390 Urinary tract infection, site not specified: Secondary | ICD-10-CM | POA: Insufficient documentation

## 2018-04-16 LAB — BASIC METABOLIC PANEL
Anion gap: 12 (ref 5–15)
BUN: 24 mg/dL — ABNORMAL HIGH (ref 8–23)
CO2: 25 mmol/L (ref 22–32)
Calcium: 9.4 mg/dL (ref 8.9–10.3)
Chloride: 97 mmol/L — ABNORMAL LOW (ref 98–111)
Creatinine, Ser: 1.38 mg/dL — ABNORMAL HIGH (ref 0.61–1.24)
GFR calc Af Amer: 60 mL/min — ABNORMAL LOW (ref >60)
GFR calc non Af Amer: 52 mL/min — ABNORMAL LOW (ref >60)
Glucose, Bld: 279 mg/dL — ABNORMAL HIGH (ref 70–99)
Potassium: 4.4 mmol/L (ref 3.5–5.1)
Sodium: 134 mmol/L — ABNORMAL LOW (ref 135–145)

## 2018-04-16 LAB — CBC
HCT: 41.3 % (ref 39.0–52.0)
Hemoglobin: 13.7 g/dL (ref 13.0–17.0)
MCH: 29.2 pg (ref 26.0–34.0)
MCHC: 33.2 g/dL (ref 30.0–36.0)
MCV: 88.1 fL (ref 78.0–100.0)
Platelets: 227 10*3/uL (ref 150–400)
RBC: 4.69 MIL/uL (ref 4.22–5.81)
RDW: 13.8 % (ref 11.5–15.5)
WBC: 21.5 10*3/uL — ABNORMAL HIGH (ref 4.0–10.5)

## 2018-04-16 LAB — URINALYSIS, ROUTINE W REFLEX MICROSCOPIC
Bilirubin Urine: NEGATIVE
Glucose, UA: >=500 mg/dL — AB
Ketones, ur: 5 mg/dL — AB
Nitrite: NEGATIVE
Protein, ur: NEGATIVE mg/dL
Specific Gravity, Urine: 1.02 (ref 1.005–1.030)
WBC, UA: >50 WBC/hpf — ABNORMAL HIGH (ref 0–5)
pH: 5 (ref 5.0–8.0)

## 2018-04-16 MED ORDER — SODIUM CHLORIDE 0.9 % IV SOLN
1.0000 g | Freq: Once | INTRAVENOUS | Status: AC
  Administered 2018-04-16: 1 g via INTRAVENOUS
  Filled 2018-04-16: qty 10

## 2018-04-16 MED ORDER — CEPHALEXIN 500 MG PO CAPS: 500.0000 mg | ORAL_CAPSULE | Freq: Four times a day (QID) | ORAL | 0 refills | Status: DC

## 2018-04-16 MED ORDER — SODIUM CHLORIDE 0.9 % IV BOLUS
1000.0000 mL | Freq: Once | INTRAVENOUS | Status: AC
  Administered 2018-04-16: 1000 mL via INTRAVENOUS

## 2018-04-16 NOTE — ED Triage Notes (Signed)
Pt was at the beach last week, states being sun burned on his legs. Pain worsening since with a "rash on legs and feet"

## 2018-04-16 NOTE — Discharge Instructions (Addendum)
You have a urinary infection.  Increase fluids.  Start antibiotic tonight.  Follow-up with your primary care doctor.

## 2018-04-16 NOTE — ED Provider Notes (Addendum)
[facility] EMERGENCY DEPARTMENT Provider Note   CSN: [id number] Arrival date & time: 04/16/18  1136     History   Chief Complaint Chief Complaint  Patient presents with  . Rash    HPI Dean Hurley is a 67 y.o. male.  Patient complains of shaking chills this morning.  No known fever, dysuria, cough, sore throat, earache, stiff neck.  He is concerned about a rash on his right lower extremity that he is questioning whether it is related to sun exposure last week.  Severity of symptoms is moderate.  Nothing makes symptoms better or worse.     Past Medical History:  Diagnosis Date  . Cancer ([place])   . Diabetes mellitus without complication ([place])   . Hypertension   . Thyroid cancer [facility])     Patient Active Problem List   Diagnosis Date Noted  . Personal history of noncompliance with medical treatment, presenting hazards to health 07/18/2017  . Thyroid cancer ([place]) 05/30/2015  . 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 Uncontrolled type 2 diabetes mellitus with complication, with long-term current use of insulin ([place]) 05/30/2015  . Mixed hyperlipidemia 05/30/2015  . Essential hypertension, benign 05/30/2015    Past Surgical History:  Procedure Laterality Date  . BACK SURGERY    . THYROID SURGERY     Thyroidectomy  . TRANSURETHRAL RESECTION OF PROSTATE          Home Medications    Prior to Admission medications   Medication Sig Start Date End Date Taking? Authorizing Provider  ACCU-CHEK FASTCLIX LANCETS MISC 1 each by Does not apply route 2 (two) times daily. 08/25/17  Yes Nida, Marella Chimes, MD  ACCU-CHEK GUIDE test strip USE AS DIRECTED TO TEST 4 TIMES A DAY 11/07/17  Yes Nida, Marella Chimes, MD  B-D ULTRAFINE III SHORT PEN 31G X 8 MM MISC USE AS DIRECTED 02/02/18  Yes Nida, Marella Chimes, MD  Continuous Blood Gluc Sensor (FREESTYLE LIBRE 14 DAY SENSOR) MISC Inject 1 each into the skin every 14 (fourteen) days. Use as directed. 01/13/18  Yes Nida,  Marella Chimes, MD  cyclobenzaprine (FLEXERIL) 10 MG tablet Take 1 tablet by mouth at bedtime as needed. 03/28/17  Yes [provider]  meloxicam (MOBIC) 7.5 MG tablet Take 1 tablet by mouth daily. 01/13/18  Yes [provider]  cephALEXin (KEFLEX) 500 MG capsule Take 1 capsule (500 mg total) by mouth 4 (four) times daily. 04/16/18   Nat Christen, MD  Insulin Detemir (LEVEMIR FLEXTOUCH) 100 UNIT/ML Pen INJECT 80 UNITS SUBCUTANEOUSLY AT BEDTIME. 02/06/18   Nida, Marella Chimes, MD  insulin lispro (HUMALOG KWIKPEN) 100 UNIT/ML KiwkPen INJECT 12-18 UNITS SUBCUTANEOUSLY 3 TIMES A DAY BEFORE MEALS 02/06/18   Nida, Marella Chimes, MD  levothyroxine (SYNTHROID, LEVOTHROID) 175 MCG tablet TAKE 1 TABLET BY MOUTH EVERY MORNING BEFORE BREAKFAST 03/03/18   Cassandria Anger, MD  lisinopril-hydrochlorothiazide (PRINZIDE,ZESTORETIC) 20-12.5 MG tablet TAKE ONE TABLET BY MOUTH DAILY , NEEDS APPOINTMENT 03/03/18   Cassandria Anger, MD  meclizine (ANTIVERT) 25 MG tablet Take 25 mg by mouth 3 (three) times daily as needed for dizziness.    [provider]  metFORMIN (GLUCOPHAGE) 1000 MG tablet TAKE 1 TABLET BY MOUTH TWICE A DAY WITH A MEAL 02/09/18   Nida, Marella Chimes, MD  rosuvastatin (CRESTOR) 5 MG tablet TAKE 1 TABLET BY MOUTH AT BEDTIME 09/25/17   Cassandria Anger, MD  triamcinolone cream (KENALOG) 0.1 % Apply 1 application topically daily as needed. 04/15/18   [provider]  Family History Family History  Problem Relation Age of Onset  . Cancer Mother   . Cancer Father     Social History Social History   Tobacco Use  . Smoking status: Former Research scientist (life sciences)  . Smokeless tobacco: Never Used  Substance Use Topics  . Alcohol use: No    Alcohol/week: 0.0 standard drinks  . Drug use: No     Allergies   Codeine and Contrast media [iodinated diagnostic agents]   Review of Systems Review of Systems  All other systems reviewed and are negative.    Physical  Exam Updated Vital Signs BP 129/73 (BP Location: Left Arm)   Pulse 79   Temp 99.3 F (37.4 C) (Oral)   Resp 19   Ht 5' 8.5" (1.74 m)   Wt 90.7 kg   SpO2 100%   BMI 29.97 kg/m   Physical Exam  Constitutional: He is oriented to person, place, and time. He appears well-developed and well-nourished.  nad  HENT:  Head: Normocephalic and atraumatic.  Eyes: Conjunctivae are normal.  Neck: Neck supple.  Cardiovascular: Normal rate and regular rhythm.  Pulmonary/Chest: Effort normal and breath sounds normal.  Abdominal: Soft. Bowel sounds are normal.  Musculoskeletal: Normal range of motion.  Neurological: He is alert and oriented to person, place, and time.  Skin:  Petechial-like rash on the medial aspect of the ankle and distal tibia  Psychiatric: He has a normal mood and affect. His behavior is normal.  Nursing note and vitals reviewed.    ED Treatments / Results  Labs (all labs ordered are listed, but only abnormal results are displayed) Labs Reviewed  BASIC METABOLIC PANEL - Abnormal; Notable for the following components:      Result Value   Sodium 134 (*)    Chloride 97 (*)    Glucose, Bld 279 (*)    BUN 24 (*)    Creatinine, Ser 1.38 (*)    GFR calc non Af Amer 52 (*)    GFR calc Af Amer 60 (*)    All other components within normal limits  CBC - Abnormal; Notable for the following components:   WBC 21.5 (*)    All other components within normal limits  URINALYSIS, ROUTINE W REFLEX MICROSCOPIC - Abnormal; Notable for the following components:   APPearance HAZY (*)    Glucose, UA >=500 (*)    Hgb urine dipstick MODERATE (*)    Ketones, ur 5 (*)    Leukocytes, UA SMALL (*)    WBC, UA >50 (*)    Bacteria, UA RARE (*)    All other components within normal limits  URINE CULTURE    EKG None  Radiology No results found.  Procedures Procedures (including critical care time)  Medications Ordered in ED Medications  sodium chloride 0.9 % bolus 1,000 mL (0 mLs  Intravenous Stopped 04/16/18 1500)  cefTRIAXone (ROCEPHIN) 1 g in sodium chloride 0.9 % 100 mL IVPB (0 g Intravenous Stopped 04/16/18 1434)     Initial Impression / Assessment and Plan / ED Course  I have reviewed the triage vital signs and the nursing notes.  Pertinent labs & imaging results that were available during my care of the patient were reviewed by me and considered in my medical decision making (see chart for details).     Patient presents with shaking chills earlier today.  He does not appear septic.  Petechial rash of uncertain significance.  White count 21.5K.  Urinalysis shows obvious infection.  Glucose 279.  Rx IV fluids, Rocephin 1 g IV, urine culture.  Patient is stable for outpatient management.  Rx Keflex 500 mg.  Discussed with patient.  Final Clinical Impressions(s) / ED Diagnoses   Final diagnoses:  Urinary tract infection without hematuria, site unspecified  Hyperglycemia    ED Discharge Orders         Ordered    cephALEXin (KEFLEX) 500 MG capsule  4 times daily     04/16/18 [place], Yariela Tison, MD 04/16/18 1709    Nat Christen, MD 04/16/18 [phone]

## 2018-04-19 ENCOUNTER — Other Ambulatory Visit: Payer: Self-pay | Admitting: "Endocrinology

## 2018-04-19 LAB — URINE CULTURE: Culture: >=100000 — AB

## 2018-04-20 ENCOUNTER — Telehealth: Payer: Self-pay | Admitting: Emergency Medicine

## 2018-04-20 NOTE — Telephone Encounter (Signed)
Post ED Visit - Positive Culture Follow-up  Culture report reviewed by antimicrobial stewardship pharmacist:  []  Elenor Quinones, Pharm.D. []  Heide Guile, Pharm.D., BCPS AQ-ID []  Parks Neptune, Pharm.D., BCPS []  Alycia Rossetti, Pharm.D., BCPS []  [place], Pharm.D., BCPS, AAHIVP []  Legrand Como, Pharm.D., BCPS, AAHIVP []  Salome Arnt, PharmD, BCPS []  Johnnette Gourd, PharmD, BCPS []  Hughes Better, PharmD, BCPS []  Leeroy Cha, PharmD Gwenlyn Found PharmD  Positive urine culture Treated with cephalexin, organism sensitive to the same and no further patient follow-up is required at this time.  Hazle Nordmann 04/20/2018, 11:54 AM

## 2018-05-06 ENCOUNTER — Other Ambulatory Visit: Payer: Self-pay | Admitting: "Endocrinology

## 2018-05-09 ENCOUNTER — Other Ambulatory Visit: Payer: Self-pay

## 2018-05-09 ENCOUNTER — Encounter (HOSPITAL_COMMUNITY): Payer: Self-pay | Admitting: Emergency Medicine

## 2018-05-09 ENCOUNTER — Emergency Department (HOSPITAL_COMMUNITY)
Admission: EM | Admit: 2018-05-09 | Discharge: 2018-05-09 | Disposition: A | Discharge status: Home / Self Care | Payer: BLUE CROSS/BLUE SHIELD | Attending: Emergency Medicine | Admitting: Emergency Medicine

## 2018-05-09 DIAGNOSIS — I10 Essential (primary) hypertension: Secondary | ICD-10-CM | POA: Diagnosis not present

## 2018-05-09 DIAGNOSIS — N3001 Acute cystitis with hematuria: Secondary | ICD-10-CM | POA: Insufficient documentation

## 2018-05-09 DIAGNOSIS — Z85850 Personal history of malignant neoplasm of thyroid: Secondary | ICD-10-CM | POA: Insufficient documentation

## 2018-05-09 DIAGNOSIS — Z79899 Other long term (current) drug therapy: Secondary | ICD-10-CM | POA: Diagnosis not present

## 2018-05-09 DIAGNOSIS — Z794 Long term (current) use of insulin: Secondary | ICD-10-CM | POA: Diagnosis not present

## 2018-05-09 DIAGNOSIS — E119 Type 2 diabetes mellitus without complications: Secondary | ICD-10-CM | POA: Insufficient documentation

## 2018-05-09 DIAGNOSIS — R350 Frequency of micturition: Secondary | ICD-10-CM | POA: Diagnosis present

## 2018-05-09 DIAGNOSIS — Z87891 Personal history of nicotine dependence: Secondary | ICD-10-CM | POA: Diagnosis not present

## 2018-05-09 LAB — URINALYSIS, ROUTINE W REFLEX MICROSCOPIC
Bilirubin Urine: NEGATIVE
Glucose, UA: 50 mg/dL — AB
Ketones, ur: NEGATIVE mg/dL
Nitrite: NEGATIVE
Protein, ur: 30 mg/dL — AB
RBC / HPF: >50 RBC/hpf — ABNORMAL HIGH (ref 0–5)
Specific Gravity, Urine: 1.016 (ref 1.005–1.030)
WBC, UA: >50 WBC/hpf — ABNORMAL HIGH (ref 0–5)
pH: 5 (ref 5.0–8.0)

## 2018-05-09 MED ORDER — NITROFURANTOIN MONOHYD MACRO 100 MG PO CAPS: 100.0000 mg | ORAL_CAPSULE | Freq: Two times a day (BID) | ORAL | 0 refills | Status: DC

## 2018-05-09 NOTE — ED Provider Notes (Signed)
[facility] EMERGENCY DEPARTMENT Provider Note   CSN: [id number] Arrival date & time: 05/09/18  1044     History   Chief Complaint Chief Complaint  Patient presents with  . Urinary Frequency    HPI Dean Hurley is a 67 y.o. male with history of thyroid cancer status post thyroidectomy on Synthroid, diabetes on insulin, hypertension is here for evaluation of cloudy and foul odor to his urine for 1 week.  He was seen in the ER 2 weeks ago for generalized weakness and was diagnosed with a UTI, he finished a round of antibiotics.  Soon after stopping the antibiotics he started noticing cloudiness and odor.  He denies any fevers, chills, malaise, nausea, vomiting, abdominal pain, flank pain, dysuria, hematuria, urinary frequency.  He is not sexually active.  No interventions for this.  No alleviating or aggravating factors.  HPI  Past Medical History:  Diagnosis Date  . Cancer ([place])   . Diabetes mellitus without complication ([place])   . Hypertension   . Thyroid cancer [facility])     Patient Active Problem List   Diagnosis Date Noted  . Personal history of noncompliance with medical treatment, presenting hazards to health 07/18/2017  . Thyroid cancer ([place]) 05/30/2015  . 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 Uncontrolled type 2 diabetes mellitus with complication, with long-term current use of insulin ([place]) 05/30/2015  . Mixed hyperlipidemia 05/30/2015  . Essential hypertension, benign 05/30/2015    Past Surgical History:  Procedure Laterality Date  . BACK SURGERY    . THYROID SURGERY     Thyroidectomy  . TRANSURETHRAL RESECTION OF PROSTATE          Home Medications    Prior to Admission medications   Medication Sig Start Date End Date Taking? Authorizing Provider  ACCU-CHEK FASTCLIX LANCETS MISC 1 each by Does not apply route 2 (two) times daily. 08/25/17   Cassandria Anger, MD  ACCU-CHEK GUIDE test strip USE AS DIRECTED TO TEST 4 TIMES A DAY 11/07/17   Nida,  Marella Chimes, MD  B-D ULTRAFINE III SHORT PEN 31G X 8 MM MISC USE AS DIRECTED 02/02/18   Nida, Marella Chimes, MD  cephALEXin (KEFLEX) 500 MG capsule Take 1 capsule (500 mg total) by mouth 4 (four) times daily. 04/16/18   Nat Christen, MD  Continuous Blood Gluc Sensor (FREESTYLE LIBRE 14 DAY SENSOR) MISC Inject 1 each into the skin every 14 (fourteen) days. Use as directed. 01/13/18   Cassandria Anger, MD  Continuous Blood Gluc Sensor (FREESTYLE LIBRE 14 DAY SENSOR) MISC USE AS DIRECTED EVERY 14 DAYS 05/07/18   Cassandria Anger, MD  cyclobenzaprine (FLEXERIL) 10 MG tablet Take 1 tablet by mouth at bedtime as needed. 03/28/17   [provider]  Insulin Detemir (LEVEMIR FLEXTOUCH) 100 UNIT/ML Pen INJECT 80 UNITS SUBCUTANEOUSLY AT BEDTIME. 02/06/18   Nida, Marella Chimes, MD  insulin lispro (HUMALOG KWIKPEN) 100 UNIT/ML KiwkPen INJECT 12-18 UNITS SUBCUTANEOUSLY 3 TIMES A DAY BEFORE MEALS 02/06/18   Nida, Marella Chimes, MD  levothyroxine (SYNTHROID, LEVOTHROID) 175 MCG tablet TAKE 1 TABLET BY MOUTH EVERY MORNING BEFORE BREAKFAST 03/03/18   Cassandria Anger, MD  lisinopril-hydrochlorothiazide (PRINZIDE,ZESTORETIC) 20-12.5 MG tablet TAKE ONE TABLET BY MOUTH DAILY , NEEDS APPOINTMENT 03/03/18   Cassandria Anger, MD  meclizine (ANTIVERT) 25 MG tablet Take 25 mg by mouth 3 (three) times daily as needed for dizziness.    [provider]  meloxicam (MOBIC) 7.5 MG tablet Take 1 tablet by mouth daily. 01/13/18  [provider]  metFORMIN (GLUCOPHAGE) 1000 MG tablet TAKE 1 TABLET BY MOUTH TWICE A DAY WITH A MEAL 05/06/18   Nida, Marella Chimes, MD  nitrofurantoin, macrocrystal-monohydrate, (MACROBID) 100 MG capsule Take 1 capsule (100 mg total) by mouth 2 (two) times daily. 05/09/18   Kinnie Feil, PA-C  rosuvastatin (CRESTOR) 5 MG tablet TAKE 1 TABLET BY MOUTH AT BEDTIME 04/20/18   Cassandria Anger, MD  triamcinolone cream (KENALOG) 0.1 % Apply 1 application  topically daily as needed. 04/15/18   [provider]    Family History Family History  Problem Relation Age of Onset  . Cancer Mother   . Cancer Father     Social History Social History   Tobacco Use  . Smoking status: Former Smoker    Types: Cigarettes  . Smokeless tobacco: Never Used  Substance Use Topics  . Alcohol use: No    Alcohol/week: 0.0 standard drinks  . Drug use: No     Allergies   Codeine and Contrast media [iodinated diagnostic agents]   Review of Systems Review of Systems  Genitourinary:       Cloudy and malodorous urine   All other systems reviewed and are negative.    Physical Exam Updated Vital Signs BP 131/66 (BP Location: Right Arm)   Pulse 89   Temp 97.9 F (36.6 C) (Oral)   Resp 18   Ht 5' 8.5" (1.74 m)   Wt 91.2 kg   SpO2 100%   BMI 30.12 kg/m   Physical Exam  Constitutional: He is oriented to person, place, and time. He appears well-developed and well-nourished.  Non toxic.  HENT:  Head: Normocephalic and atraumatic.  Nose: Nose normal.  Eyes: Pupils are equal, round, and reactive to light. Conjunctivae and EOM are normal.  Neck: Normal range of motion.  Cardiovascular: Normal rate, regular rhythm and normal heart sounds.  Pulmonary/Chest: Effort normal and breath sounds normal.  Abdominal: Soft. Bowel sounds are normal. There is no tenderness.  No G/R/R. No suprapubic or CVA tenderness. Negative Murphy's and McBurney's  Musculoskeletal: Normal range of motion.  Neurological: He is alert and oriented to person, place, and time.  Skin: Skin is warm and dry. Capillary refill takes less than 2 seconds.  Psychiatric: He has a normal mood and affect. His behavior is normal. Judgment and thought content normal.  Nursing note and vitals reviewed.    ED Treatments / Results  Labs (all labs ordered are listed, but only abnormal results are displayed) Labs Reviewed  URINALYSIS, ROUTINE W REFLEX MICROSCOPIC - Abnormal;  Notable for the following components:      Result Value   APPearance CLOUDY (*)    Glucose, UA 50 (*)    Hgb urine dipstick LARGE (*)    Protein, ur 30 (*)    Leukocytes, UA MODERATE (*)    RBC / HPF >50 (*)    WBC, UA >50 (*)    Bacteria, UA MANY (*)    All other components within normal limits  URINE CULTURE    EKG None  Radiology No results found.  Procedures Procedures (including critical care time)  Medications Ordered in ED Medications - No data to display   Initial Impression / Assessment and Plan / ED Course  I have reviewed the triage vital signs and the nursing notes.  Pertinent labs & imaging results that were available during my care of the patient were reviewed by me and considered in my medical decision making (see chart  for details).  Clinical Course as of May 09 1206  Sat May 09, 2018  1141 Hgb urine dipstick(!): LARGE [CG]  1141 Leukocytes, UA(!): MODERATE [CG]  1141 WBC, UA(!): >50 [CG]  1141 Bacteria, UA(!): MANY [CG]    Clinical Course User Index [CG] Kinnie Feil, PA-C   No constitutional symptoms.  Patient is afebrile here without tachycardia, well-appearing.  No suprapubic or CVA tenderness.  Doubt renal stone, pyelonephritis or systemic infection.  Will defer blood work or imaging today. Urinalysis suspicious for UTI.  I reviewed patient's last urine culture.  He was treated with Keflex 2 weeks ago with return of symptoms shortly after.  I spoke to pharmacist who assisted in choosing antibiotic.  Will discharge with Macrobid.  Gave patient strict return precautions, he is aware of symptoms that would warrant immediate return to the ER.  Final Clinical Impressions(s) / ED Diagnoses   Final diagnoses:  Acute cystitis with hematuria    ED Discharge Orders         Ordered    nitrofurantoin, macrocrystal-monohydrate, (MACROBID) 100 MG capsule  2 times daily     05/09/18 1205           Arlean Hopping 05/09/18 1207      Julianne Rice, MD 05/10/18 1232

## 2018-05-09 NOTE — Discharge Instructions (Addendum)
You were seen in the ER for foul odor and cloudy urine.  You have a urinary tract infection.  Take antibiotics as prescribed.  I have compared culture and macrobid should work to clear infection.  We have resent culture again today.   Return to the ER for symptoms of worsening infection including fevers, chills, malaise, weakness, burning with urination, blood in your urine, abdominal pain or flank pain.

## 2018-05-09 NOTE — ED Triage Notes (Signed)
Patient states seen here in ED two weeks ago and diagnosed with UTI. Patient given antibiotics but states urine is still cloudy with foul odor. Denies any fevers, pain, nausea, or vomiting.

## 2018-05-12 ENCOUNTER — Other Ambulatory Visit: Payer: Self-pay | Admitting: "Endocrinology

## 2018-05-12 DIAGNOSIS — E1165 Type 2 diabetes mellitus with hyperglycemia: Secondary | ICD-10-CM

## 2018-05-12 DIAGNOSIS — E538 Deficiency of other specified B group vitamins: Secondary | ICD-10-CM

## 2018-05-12 DIAGNOSIS — E039 Hypothyroidism, unspecified: Secondary | ICD-10-CM

## 2018-05-12 DIAGNOSIS — E559 Vitamin D deficiency, unspecified: Secondary | ICD-10-CM

## 2018-05-12 LAB — URINE CULTURE: Culture: >=100000 — AB

## 2018-05-13 ENCOUNTER — Telehealth: Payer: Self-pay | Admitting: *Deleted

## 2018-05-13 LAB — COMPREHENSIVE METABOLIC PANEL
ALT: 32 IU/L (ref 0–44)
AST: 13 IU/L (ref 0–40)
Albumin/Globulin Ratio: 1.7 (ref 1.2–2.2)
Albumin: 4.8 g/dL (ref 3.6–4.8)
Alkaline Phosphatase: 77 IU/L (ref 39–117)
BUN/Creatinine Ratio: 19 (ref 10–24)
BUN: 22 mg/dL (ref 8–27)
Bilirubin Total: 0.4 mg/dL (ref 0.0–1.2)
CO2: 22 mmol/L (ref 20–29)
Calcium: 10.4 mg/dL — ABNORMAL HIGH (ref 8.6–10.2)
Chloride: 100 mmol/L (ref 96–106)
Creatinine, Ser: 1.16 mg/dL (ref 0.76–1.27)
GFR calc Af Amer: 75 mL/min/{1.73_m2} (ref >59)
GFR calc non Af Amer: 65 mL/min/{1.73_m2} (ref >59)
Globulin, Total: 2.8 g/dL (ref 1.5–4.5)
Glucose: 166 mg/dL — ABNORMAL HIGH (ref 65–99)
Potassium: 5 mmol/L (ref 3.5–5.2)
Sodium: 141 mmol/L (ref 134–144)
Total Protein: 7.6 g/dL (ref 6.0–8.5)

## 2018-05-13 LAB — VITAMIN B12: Vitamin B-12: 386 pg/mL (ref 232–1245)

## 2018-05-13 LAB — MICROALBUMIN / CREATININE URINE RATIO
Creatinine, Urine: 74.6 mg/dL
Microalb/Creat Ratio: 50.4 mg/g creat — ABNORMAL HIGH (ref 0.0–30.0)
Microalbumin, Urine: 37.6 ug/mL

## 2018-05-13 LAB — HEMOGLOBIN A1C
Est. average glucose Bld gHb Est-mCnc: 206 mg/dL
Hgb A1c MFr Bld: 8.8 % — ABNORMAL HIGH (ref 4.8–5.6)

## 2018-05-13 LAB — T4, FREE: Free T4: 1.84 ng/dL — ABNORMAL HIGH (ref 0.82–1.77)

## 2018-05-13 LAB — VITAMIN D 25 HYDROXY (VIT D DEFICIENCY, FRACTURES): Vit D, 25-Hydroxy: 26.7 ng/mL — ABNORMAL LOW (ref 30.0–100.0)

## 2018-05-13 LAB — TSH: TSH: 0.158 u[IU]/mL — ABNORMAL LOW (ref 0.450–4.500)

## 2018-05-13 NOTE — Telephone Encounter (Signed)
Post ED Visit - Positive Culture Follow-up  Culture report reviewed by antimicrobial stewardship pharmacist:  []  Elenor Quinones, Pharm.D. []  Heide Guile, Pharm.D., BCPS AQ-ID []  Parks Neptune, Pharm.D., BCPS []  Alycia Rossetti, Pharm.D., BCPS []  [place], Pharm.D., BCPS, AAHIVP []  Legrand Como, Pharm.D., BCPS, AAHIVP []  Salome Arnt, PharmD, BCPS []  Johnnette Gourd, PharmD, BCPS []  Hughes Better, PharmD, BCPS []  Leeroy Cha, PharmD Vertis Kelch, PharmD  Positive urine culture Treated with [facility] Macro, organism sensitive to the same and no further patient follow-up is required at this time.  Harlon Flor [facility] 05/13/2018, 11:48 AM

## 2018-05-14 ENCOUNTER — Ambulatory Visit: Payer: BLUE CROSS/BLUE SHIELD | Admitting: "Endocrinology

## 2018-05-17 ENCOUNTER — Other Ambulatory Visit: Payer: Self-pay

## 2018-05-17 ENCOUNTER — Encounter (HOSPITAL_COMMUNITY): Payer: Self-pay | Admitting: Emergency Medicine

## 2018-05-17 ENCOUNTER — Emergency Department (HOSPITAL_COMMUNITY)
Admission: EM | Admit: 2018-05-17 | Discharge: 2018-05-17 | Disposition: A | Discharge status: Home / Self Care | Payer: BLUE CROSS/BLUE SHIELD | Attending: Emergency Medicine | Admitting: Emergency Medicine

## 2018-05-17 DIAGNOSIS — R319 Hematuria, unspecified: Secondary | ICD-10-CM | POA: Diagnosis present

## 2018-05-17 DIAGNOSIS — I10 Essential (primary) hypertension: Secondary | ICD-10-CM | POA: Insufficient documentation

## 2018-05-17 DIAGNOSIS — E782 Mixed hyperlipidemia: Secondary | ICD-10-CM | POA: Insufficient documentation

## 2018-05-17 DIAGNOSIS — N390 Urinary tract infection, site not specified: Secondary | ICD-10-CM | POA: Insufficient documentation

## 2018-05-17 DIAGNOSIS — Z87891 Personal history of nicotine dependence: Secondary | ICD-10-CM | POA: Diagnosis not present

## 2018-05-17 DIAGNOSIS — E119 Type 2 diabetes mellitus without complications: Secondary | ICD-10-CM | POA: Diagnosis not present

## 2018-05-17 DIAGNOSIS — Z85850 Personal history of malignant neoplasm of thyroid: Secondary | ICD-10-CM | POA: Insufficient documentation

## 2018-05-17 DIAGNOSIS — Z79899 Other long term (current) drug therapy: Secondary | ICD-10-CM | POA: Diagnosis not present

## 2018-05-17 LAB — CBC WITH DIFFERENTIAL/PLATELET
Basophils Absolute: 0.1 10*3/uL (ref 0.0–0.1)
Basophils Relative: 1 %
Eosinophils Absolute: 0.4 10*3/uL (ref 0.0–0.7)
Eosinophils Relative: 5 %
HCT: 41.8 % (ref 39.0–52.0)
Hemoglobin: 13.5 g/dL (ref 13.0–17.0)
Lymphocytes Relative: 22 %
Lymphs Abs: 1.6 10*3/uL (ref 0.7–4.0)
MCH: 29.3 pg (ref 26.0–34.0)
MCHC: 32.3 g/dL (ref 30.0–36.0)
MCV: 90.7 fL (ref 78.0–100.0)
Monocytes Absolute: 0.6 10*3/uL (ref 0.1–1.0)
Monocytes Relative: 8 %
Neutro Abs: 4.8 10*3/uL (ref 1.7–7.7)
Neutrophils Relative %: 64 %
Platelets: 204 10*3/uL (ref 150–400)
RBC: 4.61 MIL/uL (ref 4.22–5.81)
RDW: 14.2 % (ref 11.5–15.5)
WBC: 7.5 10*3/uL (ref 4.0–10.5)

## 2018-05-17 LAB — URINALYSIS, ROUTINE W REFLEX MICROSCOPIC
Bilirubin Urine: NEGATIVE
Glucose, UA: 50 mg/dL — AB
Ketones, ur: NEGATIVE mg/dL
Nitrite: NEGATIVE
Protein, ur: 30 mg/dL — AB
RBC / HPF: >50 RBC/hpf — ABNORMAL HIGH (ref 0–5)
Specific Gravity, Urine: 1.017 (ref 1.005–1.030)
WBC, UA: >50 WBC/hpf — ABNORMAL HIGH (ref 0–5)
pH: 5 (ref 5.0–8.0)

## 2018-05-17 LAB — BASIC METABOLIC PANEL
Anion gap: 9 (ref 5–15)
BUN: 16 mg/dL (ref 8–23)
CO2: 23 mmol/L (ref 22–32)
Calcium: 8.8 mg/dL — ABNORMAL LOW (ref 8.9–10.3)
Chloride: 106 mmol/L (ref 98–111)
Creatinine, Ser: 1.06 mg/dL (ref 0.61–1.24)
GFR calc Af Amer: >60 mL/min (ref >60)
GFR calc non Af Amer: >60 mL/min (ref >60)
Glucose, Bld: 243 mg/dL — ABNORMAL HIGH (ref 70–99)
Potassium: 4 mmol/L (ref 3.5–5.1)
Sodium: 138 mmol/L (ref 135–145)

## 2018-05-17 MED ORDER — CEPHALEXIN 500 MG PO CAPS: 500.0000 mg | ORAL_CAPSULE | Freq: Two times a day (BID) | ORAL | 0 refills | Status: AC

## 2018-05-17 NOTE — Discharge Instructions (Addendum)
It is very important to follow-up with the urologist.  They can help investigate why you keep getting urinary tract infections.  If you develop fever, flank pain, vomiting, or any other new/concerning symptoms then return to the ER for evaluation.

## 2018-05-17 NOTE — ED Provider Notes (Signed)
[facility] EMERGENCY DEPARTMENT Provider Note   CSN: [id number] Arrival date & time: 05/17/18  1236     History   Chief Complaint Chief Complaint  Patient presents with  . Hematuria    HPI Dean Hurley is a 67 y.o. male.  HPI  67 year old male with a history of diabetes, hypertension and thyroid cancer status post thyroidectomy presents with recurrent urinary tract infection symptoms.  He states that starting a couple weeks ago he developed foul-smelling urine and cloudy urine.  He was seen here and put on Keflex.  Seem to improve and then the cloudiness and foul smell came back and so he came back to the ER and was put on Macrobid.  Seem to improve again but then yesterday when he went to the bathroom at a store he noticed a lot of hematuria.  Had hematuria a couple other times but seem to be clearing up.  The smell has returned and now his urine is cloudy again.  There is no further hematuria.  He denies any abdominal pain, flank pain or back pain.  There is no vomiting or fevers.  He denies any intercourse or concern for STI.  There is no penile discharge.  His testicles do not hurt.  One time he states that it felt like his penis hurt when he was urinating but otherwise there is no pain with urination or frequency.  Past Medical History:  Diagnosis Date  . Cancer ([place])   . Diabetes mellitus without complication ([place])   . Hypertension   . Thyroid cancer [facility])     Patient Active Problem List   Diagnosis Date Noted  . Personal history of noncompliance with medical treatment, presenting hazards to health 07/18/2017  . Thyroid cancer ([place]) 05/30/2015  . 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 Uncontrolled type 2 diabetes mellitus with complication, with long-term current use of insulin ([place]) 05/30/2015  . Mixed hyperlipidemia 05/30/2015  . Essential hypertension, benign 05/30/2015    Past Surgical History:  Procedure Laterality Date  . BACK SURGERY    . THYROID  SURGERY     Thyroidectomy  . TRANSURETHRAL RESECTION OF PROSTATE          Home Medications    Prior to Admission medications   Medication Sig Start Date End Date Taking? Authorizing Provider  alum & mag hydroxide-simeth (MAALOX/MYLANTA) 200-200-20 MG/5ML suspension Take 20 mLs by mouth every 6 (six) hours as needed for heartburn.   Yes [provider]  Insulin Detemir (LEVEMIR FLEXTOUCH) 100 UNIT/ML Pen INJECT 80 UNITS SUBCUTANEOUSLY AT BEDTIME. Patient taking differently: Inject 80 Units into the skin. INJECT 80 UNITS SUBCUTANEOUSLY AT BEDTIME. 02/06/18  Yes Nida, Marella Chimes, MD  insulin lispro (HUMALOG KWIKPEN) 100 UNIT/ML KiwkPen INJECT 12-18 UNITS SUBCUTANEOUSLY 3 TIMES A DAY BEFORE MEALS Patient taking differently: INJECT 10-12 UNITS SUBCUTANEOUSLY 3 TIMES A DAY BEFORE MEALS 02/06/18  Yes Nida, Marella Chimes, MD  levothyroxine (SYNTHROID, LEVOTHROID) 175 MCG tablet TAKE 1 TABLET BY MOUTH EVERY MORNING BEFORE BREAKFAST Patient taking differently: Take 175 mcg by mouth daily before breakfast. TAKE 1 TABLET BY MOUTH EVERY MORNING BEFORE BREAKFAST 03/03/18  Yes Nida, Marella Chimes, MD  lisinopril-hydrochlorothiazide (PRINZIDE,ZESTORETIC) 20-12.5 MG tablet TAKE ONE TABLET BY MOUTH DAILY , NEEDS APPOINTMENT Patient taking differently: Take 1 tablet by mouth daily. TAKE ONE TABLET BY MOUTH DAILY , NEEDS APPOINTMENT 03/03/18  Yes Nida, Marella Chimes, MD  meloxicam (MOBIC) 7.5 MG tablet Take 1 tablet by mouth daily. 01/13/18  Yes [provider]  metFORMIN (GLUCOPHAGE) 1000 MG tablet TAKE 1 TABLET BY MOUTH TWICE A DAY WITH A MEAL Patient taking differently: Take 1,000 mg by mouth 2 (two) times daily with a meal.  05/06/18  Yes Nida, Marella Chimes, MD  triamcinolone cream (KENALOG) 0.1 % Apply 1 application topically daily as needed (for scaly skin).  04/15/18  Yes [provider]  cephALEXin (KEFLEX) 500 MG capsule Take 1 capsule (500 mg total) by mouth 2 (two)  times daily for 7 days. 05/17/18 05/24/18  Sherwood Gambler, MD  meclizine (ANTIVERT) 25 MG tablet Take 25 mg by mouth 3 (three) times daily as needed for dizziness.    [provider]  rosuvastatin (CRESTOR) 5 MG tablet TAKE 1 TABLET BY MOUTH AT BEDTIME Patient taking differently: Take 5 mg by mouth at bedtime.  04/20/18   Cassandria Anger, MD    Family History Family History  Problem Relation Age of Onset  . Cancer Mother   . Cancer Father     Social History Social History   Tobacco Use  . Smoking status: Former Smoker    Types: Cigarettes  . Smokeless tobacco: Never Used  Substance Use Topics  . Alcohol use: No    Alcohol/week: 0.0 standard drinks  . Drug use: No     Allergies   Codeine and Contrast media [iodinated diagnostic agents]   Review of Systems Review of Systems  Constitutional: Negative for fever.  Gastrointestinal: Negative for abdominal pain and vomiting.  Genitourinary: Positive for hematuria. Negative for discharge, dysuria, flank pain and testicular pain.       Cloudy urine and foul smell  Musculoskeletal: Negative for back pain.  All other systems reviewed and are negative.    Physical Exam Updated Vital Signs BP 128/72 (BP Location: Right Arm)   Pulse 96   Temp 97.9 F (36.6 C) (Temporal) Comment: unable to obtain oral due to pt with bottle water  Resp 18   Ht 5\' 8"  (1.727 m)   Wt 93 kg   SpO2 96%   BMI 31.17 kg/m   Physical Exam  Constitutional: He appears well-developed and well-nourished. No distress.  HENT:  Head: Normocephalic and atraumatic.  Right Ear: External ear normal.  Left Ear: External ear normal.  Nose: Nose normal.  Eyes: Right eye exhibits no discharge. Left eye exhibits no discharge.  Neck: Neck supple.  Cardiovascular: Normal rate, regular rhythm and normal heart sounds.  Pulmonary/Chest: Effort normal.  Abdominal: Soft. There is no tenderness.  Genitourinary: Prostate is not tender. Right testis  shows no tenderness. Left testis shows no tenderness. Circumcised. No penile erythema or penile tenderness. No discharge found.  Musculoskeletal: He exhibits no edema.  Neurological: He is alert.  Skin: Skin is warm and dry. He is not diaphoretic.  Psychiatric: His mood appears not anxious.  Nursing note and vitals reviewed.    ED Treatments / Results  Labs (all labs ordered are listed, but only abnormal results are displayed) Labs Reviewed  BASIC METABOLIC PANEL - Abnormal; Notable for the following components:      Result Value   Glucose, Bld 243 (*)    Calcium 8.8 (*)    All other components within normal limits  URINALYSIS, ROUTINE W REFLEX MICROSCOPIC - Abnormal; Notable for the following components:   APPearance CLOUDY (*)    Glucose, UA 50 (*)    Hgb urine dipstick LARGE (*)    Protein, ur 30 (*)    Leukocytes, UA LARGE (*)    RBC /  HPF >50 (*)    WBC, UA >50 (*)    Bacteria, UA FEW (*)    All other components within normal limits  URINE CULTURE  CBC WITH DIFFERENTIAL/PLATELET    EKG None  Radiology No results found.  Procedures Procedures (including critical care time)  Medications Ordered in ED Medications - No data to display   Initial Impression / Assessment and Plan / ED Course  I have reviewed the triage vital signs and the nursing notes.  Pertinent labs & imaging results that were available during my care of the patient were reviewed by me and considered in my medical decision making (see chart for details).     Urine is consistent with urinary tract infection.  Otherwise his exam is benign including no signs or symptoms of prostatitis.  I discussed with Dr. Tresa Moore of urology.  Concern for possible fistula.  He will eventually need an outpatient, nonemergent CT scan of his abdomen and pelvis with contrast.  Given his contrast allergy I do not think this is emergently needed and he can have this done as an outpatient and Dr. Tresa Moore agrees.  Will place on  antibiotics.  It appears that his E. coli UTIs are pan sensitive and I do not think he is failing outpatient treatment, I think is just getting reinfected each time.  Placed back on Keflex. D/c home with return precautions.  Final Clinical Impressions(s) / ED Diagnoses   Final diagnoses:  Acute urinary tract infection    ED Discharge Orders         Ordered    cephALEXin (KEFLEX) 500 MG capsule  2 times daily     05/17/18 1407           Sherwood Gambler, MD 05/17/18 2203

## 2018-05-17 NOTE — ED Triage Notes (Signed)
Patient c/o hematuria with dysuria that started 3 weeks ago. Per patient seen here in Er x2 and given different antibiotics with no improvement. Denies any fevers.

## 2018-05-19 LAB — URINE CULTURE: Culture: >=100000 — AB

## 2018-05-20 ENCOUNTER — Telehealth: Payer: Self-pay | Admitting: Emergency Medicine

## 2018-05-20 NOTE — Telephone Encounter (Signed)
Post ED Visit - Positive Culture Follow-up  Culture report reviewed by antimicrobial stewardship pharmacist:  []  Elenor Quinones, Pharm.D. []  Heide Guile, Pharm.D., BCPS AQ-ID []  Parks Neptune, Pharm.D., BCPS []  Alycia Rossetti, Pharm.D., BCPS []  [place], Pharm.D., BCPS, AAHIVP []  Legrand Como, Pharm.D., BCPS, AAHIVP []  Salome Arnt, PharmD, BCPS []  Johnnette Gourd, PharmD, BCPS []  Hughes Better, PharmD, BCPS []  Leeroy Cha, PharmD  Positive urine culture Treated with cephalexin, organism sensitive to the same and no further patient follow-up is required at this time.  Hazle Nordmann 05/20/2018, 10:52 AM

## 2018-05-24 ENCOUNTER — Other Ambulatory Visit: Payer: Self-pay | Admitting: "Endocrinology

## 2018-06-05 ENCOUNTER — Other Ambulatory Visit: Payer: Self-pay | Admitting: "Endocrinology

## 2018-06-11 ENCOUNTER — Ambulatory Visit (INDEPENDENT_AMBULATORY_CARE_PROVIDER_SITE_OTHER): Payer: BLUE CROSS/BLUE SHIELD | Admitting: "Endocrinology

## 2018-06-11 ENCOUNTER — Encounter: Payer: Self-pay | Admitting: "Endocrinology

## 2018-06-11 VITALS — BP 115/75 | HR 62 | Ht 68.0 in | Wt 206.0 lb

## 2018-06-11 DIAGNOSIS — C73 Malignant neoplasm of thyroid gland: Secondary | ICD-10-CM | POA: Diagnosis not present

## 2018-06-11 DIAGNOSIS — E890 Postprocedural hypothyroidism: Secondary | ICD-10-CM

## 2018-06-11 DIAGNOSIS — E782 Mixed hyperlipidemia: Secondary | ICD-10-CM

## 2018-06-11 DIAGNOSIS — Z794 Long term (current) use of insulin: Secondary | ICD-10-CM

## 2018-06-11 DIAGNOSIS — I10 Essential (primary) hypertension: Secondary | ICD-10-CM

## 2018-06-11 DIAGNOSIS — E1165 Type 2 diabetes mellitus with hyperglycemia: Secondary | ICD-10-CM

## 2018-06-11 DIAGNOSIS — E118 Type 2 diabetes mellitus with unspecified complications: Secondary | ICD-10-CM | POA: Diagnosis not present

## 2018-06-11 DIAGNOSIS — IMO0002 Reserved for concepts with insufficient information to code with codable children: Secondary | ICD-10-CM

## 2018-06-11 MED ORDER — LEVOTHYROXINE SODIUM 150 MCG PO TABS: ORAL_TABLET | ORAL | 1 refills | Status: DC

## 2018-06-11 MED ORDER — ROSUVASTATIN CALCIUM 10 MG PO TABS: 10.0000 mg | ORAL_TABLET | Freq: Every day | ORAL | 1 refills | Status: DC

## 2018-06-11 MED ORDER — INSULIN LISPRO 100 UNIT/ML (KWIKPEN): 15.0000 [IU] | PEN_INJECTOR | Freq: Three times a day (TID) | SUBCUTANEOUS | 2 refills | Status: DC

## 2018-06-11 NOTE — Progress Notes (Signed)
Endocrinology follow-up note  Subjective:    Patient ID: Dean Hurley, male    DOB: 12-06-1950,    Past Medical History:  Diagnosis Date  . Cancer ([place])   . Diabetes mellitus without complication ([place])   . Hypertension   . Thyroid cancer [facility])    Past Surgical History:  Procedure Laterality Date  . BACK SURGERY    . THYROID SURGERY     Thyroidectomy  . TRANSURETHRAL RESECTION OF PROSTATE     Social History   Socioeconomic History  . Marital status: Married    Spouse name: Not on file  . Number of children: Not on file  . Years of education: Not on file  . Highest education level: Not on file  Occupational History  . Not on file  Social Needs  . Financial resource strain: Not on file  . Food insecurity:    Worry: Not on file    Inability: Not on file  . Transportation needs:    Medical: Not on file    Non-medical: Not on file  Tobacco Use  . Smoking status: Former Smoker    Types: Cigarettes  . Smokeless tobacco: Never Used  Substance and Sexual Activity  . Alcohol use: No    Alcohol/week: 0.0 standard drinks  . Drug use: No  . Sexual activity: Not on file  Lifestyle  . Physical activity:    Days per week: Not on file    Minutes per session: Not on file  . Stress: Not on file  Relationships  . Social connections:    Talks on phone: Not on file    Gets together: Not on file    Attends religious service: Not on file    Active member of club or organization: Not on file    Attends meetings of clubs or organizations: Not on file    Relationship status: Not on file  Other Topics Concern  . Not on file  Social History Narrative  . Not on file   Outpatient Encounter Medications as of 06/11/2018  Medication Sig  . alum & mag hydroxide-simeth (MAALOX/MYLANTA) 200-200-20 MG/5ML suspension Take 20 mLs by mouth every 6 (six) hours as needed for heartburn.  . Insulin Detemir (LEVEMIR FLEXTOUCH) 100 UNIT/ML Pen INJECT 80 UNITS  SUBCUTANEOUSLY AT BEDTIME. (Patient taking differently: Inject 80 Units into the skin. INJECT 80 UNITS SUBCUTANEOUSLY AT BEDTIME.)  . Insulin Detemir (LEVEMIR FLEXTOUCH) 100 UNIT/ML Pen Inject 80 Units into the skin at bedtime.  . insulin lispro (HUMALOG KWIKPEN) 100 UNIT/ML KiwkPen Inject 0.15-0.21 mLs (15-21 Units total) into the skin 3 (three) times daily before meals.  Marland Kitchen levothyroxine (SYNTHROID, LEVOTHROID) 150 MCG tablet TAKE 1 TABLET BY MOUTH EVERY DAY BEFORE BREAKFAST  . lisinopril-hydrochlorothiazide (PRINZIDE,ZESTORETIC) 20-12.5 MG tablet TAKE ONE TABLET BY MOUTH DAILY  . meclizine (ANTIVERT) 25 MG tablet Take 25 mg by mouth 3 (three) times daily as needed for dizziness.  . meloxicam (MOBIC) 7.5 MG tablet Take 1 tablet by mouth daily.  . metFORMIN (GLUCOPHAGE) 1000 MG tablet TAKE 1 TABLET BY MOUTH TWICE A DAY WITH A MEAL (Patient taking differently: Take 1,000 mg by mouth 2 (two) times daily with a meal. )  . rosuvastatin (CRESTOR) 10 MG tablet Take 1 tablet (10 mg total) by mouth at bedtime.  . triamcinolone cream (KENALOG) 0.1 % Apply 1 application  topically daily as needed (for scaly skin).   . [DISCONTINUED] insulin lispro (HUMALOG KWIKPEN) 100 UNIT/ML KiwkPen INJECT 12-18 UNITS SUBCUTANEOUSLY 3 TIMES A DAY BEFORE MEALS (Patient taking differently: INJECT 10-12 UNITS SUBCUTANEOUSLY 3 TIMES A DAY BEFORE MEALS)  . [DISCONTINUED] levothyroxine (SYNTHROID, LEVOTHROID) 175 MCG tablet TAKE 1 TABLET BY MOUTH EVERY DAY BEFORE BREAKFAST  . [DISCONTINUED] rosuvastatin (CRESTOR) 5 MG tablet TAKE 1 TABLET BY MOUTH AT BEDTIME (Patient taking differently: Take 5 mg by mouth at bedtime. )   No facility-administered encounter medications on file as of 06/11/2018.    ALLERGIES: Allergies  Allergen Reactions  . Codeine   . Contrast Media [Iodinated Diagnostic Agents]    VACCINATION STATUS:  There is no immunization history on file for this patient.  HPI   67 yr old male who underwent total  thyroidectomy in [facility] on June 02, 2012.  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 1.7 cm cancer nodule on the right lobe and 0.4 cm cancer nodule on the left lobe. Partial invasion of capsule, margins negative. He underwent thyrogen stimulated I131  thyroid remnant ablation with negative whole body scan for distant metastasis on July 17, 2013.   His May 2014 thyroid /neck ultrasound was negative for any mass lesion.  His follow up Thyrogen stimulated WBS on 10/25/13 was c/w successful ablation of thyroid remnants with no evidence of local recurrence or metastatic disease. He has  had another thyroid ultrasound on 10/11/14 which was negative for residual thyroid tissue. On his next  surveillance thyroid ultrasound on September 6,2016 however he has had a 9 mm residual tissue on the  right thyroid bed, which grew to 12 mm in follow-up ultrasound in December 2016. He was sent to [facility] where he had his prior surgery. Fine needle  aspiration of the residual tissue did not reveal diagnostic material.  - On thyroid/neck ultrasound  on 02/01/2016 showed that the remnant thyroid tissue decreased in size to 1.0 cm, all other  findings unchanged.  - His last  Thyrogen stimulated whole-body scan on  06/21/2016 was  reported to be negative for any iodine avid metastatic thyroid cancer.  - On 06/27/2017 : His previsit ultrasound at an outside facility in [facility] revealed an echogenic focus measuring 1.7cm x 0.7cm x 1.1 cm ( previously 1 cm-1.2 cm in [facility] imaging on 02/01/2016) potentially representing a small amount of residual thyroid tissue versus small lymph node. There is no identifiable tissue on the left, no identifiable isthmus.  -He pushed to get his previsit ultrasound prematurely on August 08, 2017 which showed no significant change in the thyroid remnant at 1.2 cm.  -His most recent  repeat surveillance  thyroid/neck ultrasound on February 02, 2018 is unchanged showing stable 1.2 cm nodular tissue ithin the right lobectomy bed, unchanged since December 2016.    He is currently on Synthroid 175 mcg po qam. He reports compliance to this medication.   He is also being treated for type 2 DM , not very compliant to this diagnosis.  Currently using CGM device, did not document his insulin administration records.  He made dosing errors on Humalog. His A1c is slowly improving to 8.8% from 10%, on intensive insulin treatment and metformin.  He returns with better blood glucose profile, still above target.  No hypoglycemia reported nor documented on his CGM device.    Review of Systems  Constitutional:  + Progressive weight gain ,   +  fatigue, no subjective hyperthermia nor hyponatremia.    Eyes: no blurry vision, no xerophthalmia ENT: no sore throat,  S/p thyroidectomy, no dysphagia, odynophagia, change in voice. Cardiovascular: No chest pain, no palpitations. Respiratory: no cough/SOB Gastrointestinal: no N/V/D, + constipation Musculoskeletal: no muscle/joint aches, + back pain Skin: no rashes Neurological: Denies tremors, numbness, tingling.   Psychiatric: no depression/anxiety   Objective:    BP 115/75   Pulse 62   Ht '5\' 8"'  (1.727 m)   Wt 206 lb (93.4 kg)   BMI 31.32 kg/m   Wt Readings from Last 3 Encounters:  06/11/18 206 lb (93.4 kg)  05/17/18 205 lb (93 kg)  05/09/18 201 lb (91.2 kg)    Physical Exam  Constitutional: + Obese, alert and oriented x3, not in acute distress.  Eyes: PERRLA, EOMI, no exophthalmos ENT: moist mucous membranes, +thyroidectomy scar , no cervical mass or lymphadenopathy.  Musculoskeletal: no deformities, strength intact in all 4 Skin: moist, warm, no rashes Neurological: No tremors of outstretched hands.    CMP Latest Ref Rng & Units 05/17/2018 05/12/2018 04/16/2018  Glucose 70 - 99 mg/dL 243(H) 166(H) 279(H)  BUN 8 - 23 mg/dL 16 22 24(H)  Creatinine  0.61 - 1.24 mg/dL 1.06 1.16 1.38(H)  Sodium 135 - 145 mmol/L 138 141 134(L)  Potassium 3.5 - 5.1 mmol/L 4.0 5.0 4.4  Chloride 98 - 111 mmol/L 106 100 97(L)  CO2 22 - 32 mmol/L '23 22 25  ' Calcium 8.9 - 10.3 mg/dL 8.8(L) 10.4(H) 9.4  Total Protein 6.0 - 8.5 g/dL - 7.6 -  Total Bilirubin 0.0 - 1.2 mg/dL - 0.4 -  Alkaline Phos 39 - 117 IU/L - 77 -  AST 0 - 40 IU/L - 13 -  ALT 0 - 44 IU/L - 32 -   Results for MATYAS, BAISLEY (MRN [id number]) as of 06/11/2018 11:50  Ref. Range 02/02/2018 12:12 04/16/2018 13:03 05/12/2018 11:16 05/17/2018 13:05  eAG (mmol/L) Latest Units: (calc) 11.9     Glucose Latest Ref Range: 70 - 99 mg/dL 163 (H) 279 (H) 166 (H) 243 (H)  Hemoglobin A1C Latest Ref Range: 4.8 - 5.6 % 9.1 (H)  8.8 (H)   Est. average glucose Bld gHb Est-mCnc Latest Units: mg/dL   206   TSH Latest Ref Range: 0.450 - 4.500 uIU/mL 11.84 (H)  0.158 (L)   T4,Free(Direct) Latest Ref Range: 0.82 - 1.77 ng/dL 1.2  1.84 (H)      Thyrogen stimulated whole-body scan on 06/21/2016: IMPRESSION: Negative whole-body I 131 scan for recurrent iodine avid thyroid Cancer.  06/14/2016 thyroglobulin antibodies negative, thyroglobulin by IME less than 0.1  06/27/2017 - His previsit ultrasound at an outside facility in [facility] revealed an echogenic focus measuring 1.7cm x 0.7cm x 1.1 cm ( previously 1 cm-1.2 cm in [facility] imaging on 02/01/2016) potentially representing a small amount of residual thyroid tissue versus small lymph node. There is no identifiable tissue on the left, no identifiable isthmus.  August 08, 2018 thyroid/neck ultrasound in [facility]:   FINDINGS: Residual solid nodular isoechoic tissue in the right thyroid noted measures 1.2 x 1.0 x 1.0 cm, previously 1.0 x 0.7 x 0.8 cm. No significant interval change. No new abnormality.  Similar ultrasound findings on February 02, 2018. See above.   Assessment & Plan:   1.  Papillary Thyroid cancer  [facility]):  Patient is s/p total thyroidectomy for stage 1 ( T1bNoMx) PTC , multicentric ( 1.7 cm on right lobe and  0.4cms in the left lobe). on July 17, 2012, thyrogen stimulated RAI thyroid remnant ablation given his BRAF positive presentation.  The post therapy scan was negative for distant metastasis. His May 2014 thyroid ultrasound is negative for remnant tissue.  Subsequent surveillance thyrogen stimulated WBS on 10/25/13 was c/w successful ablation of thyroid remnants, with no evidence of local recurrence nor metastatic disease.  His repeat thyroid ultrasound on 10/11/14 showed no residual thyroid tissue. However, on his last surveillance thyroid/neck ultrasound she was found to have a new 52m residual/recurrent soft tissue nodule in the right  thyroidectomy bed. On subsequent surveillance thyroid ultrasound this tissue was found to be larger at 12 mm. Attempt to biopsy this up to [facility] was not diagnostic.   - On thyroid/neck ultrasound before this visit on 02/01/2016 showed that the remnant thyroid tissue decreased in size to 1.0 cm, all other  findings unchanged.  There was no sign of residual thyroid tissue on whole-body scan performed 2015.  She is thyroglobulin by IMA was less than 0.1 associated with negative thyroglobulin antibodies on 06/14/2016.  - Thyrogen stimulated whole-body scan on 06/21/2016 was negative for iodine avid metastatic thyroid cancer.   - Most recent thyroid/neck ultrasound on 06/27/2017 showed persistence of possibly thyroid tissue in the right thyroid lobe bed- this was done within a different facility than his prior imaging studies-subject to variations of measurements.   -His previsit thyroid/neck ultrasound was done on August 08, 2017 prematurely due to his insistence. -The report was that there was no major interval change in the thyroid remnant on the right thyroid bed.  - [facility]  shows stable finding of 1.2 cm nodular area within the right lobectomy bed. No intervention at this time. He will be considered for thyrogen stimulated WBS in 3 months.   2. Postsurgical hypothyroidism -His labs are consistent with over replacement with thyroid hormone, largely due to the fact that patient is more consistent taking his thyroid hormone this time.   -I discussed and lowered his Synthroid back to 150 mcg p.o. daily for breakfast.     - We discussed about correct intake of levothyroxine, at fasting, with water, separated by at least 30 minutes from breakfast, and separated by more than 4 hours from calcium, iron, multivitamins, acid reflux medications (PPIs). -Patient is made aware of the fact that thyroid hormone replacement is needed for life, dose to be adjusted by periodic monitoring of thyroid function tests.  3. Uncontrolled type 2 diabetes mellitus with complication, with long-term [place] use of insulin [place] - He has a history of noncompliance.  His recent A1c is improving to 8.8%, progressively improving from 10% .  -He will continue to require intensive treatment with basal/bolus insulin.  He did not inject Humalog according to the orders provided to him last visit.  -He is advised to continue Levemir 80 units nightly, increase log to 15 units  3 times daily before meals for pre-meal blood glucose readings above 90 mg/dL, plus patient specific correction for readings above 150 mg/dL.    - He is urged to wear his  CGM device at all times and to continue documenting his blood glucose at least 4 times a day-before meals and at bedtime.    -He will continue metformin 1000 mg p.o. twice daily.  4. Hyperlipidemia: Uncontrolled with a recent LDL of 126. Discussed and increase his Crestor to 10 mg p.o. nightly.   5. Essential hypertension:  His BP is controlled to target.  -He is advised to be consistent in taking his blood pressure medications including lisinopril 20  mg/hydrochlorothiazide 12.5 mg p.o. daily.     I advised patient to maintain close follow up with his PCP for primary care needs.  - Time spent with the patient: 25 min, of which >50% was spent in reviewing his blood glucose logs , discussing his hypo- and hyper-glycemic episodes, reviewing his current and  previous labs and insulin doses and developing a plan to avoid hypo- and hyper-glycemia. Please refer to Patient Instructions for Blood Glucose Monitoring and Insulin/Medications Dosing Guide"  in media tab for additional information. Stevie Kern participated in the discussions, expressed understanding, and voiced agreement with the above plans.  All questions were answered to his satisfaction. he is encouraged to contact clinic should he have any questions or concerns prior to his return visit.   Follow up plan: Return in about 4 months (around 10/10/2018) for Meter, and Logs, Follow up with Whole Body Scan w/Thyrogen.  Glade Lloyd, MD Phone: [phone]  Fax: [phone]   This note was partially dictated with voice recognition software. Similar sounding words can be transcribed inadequately or may not  be corrected upon review.  06/11/2018, 11:46 AM

## 2018-06-11 NOTE — Patient Instructions (Signed)

## 2018-06-15 ENCOUNTER — Other Ambulatory Visit: Payer: Self-pay

## 2018-06-15 MED ORDER — FREESTYLE LIBRE 14 DAY SENSOR MISC: 1.0000 | 2 refills | Status: DC

## 2018-06-30 ENCOUNTER — Telehealth: Payer: Self-pay

## 2018-06-30 NOTE — Telephone Encounter (Signed)
Pt states he is unable to take the increased dose of Rosuvastatin. He states he is having a lot of joint pain w/ the increase. Please advise.

## 2018-06-30 NOTE — Telephone Encounter (Signed)
Pt notified by VM 

## 2018-06-30 NOTE — Telephone Encounter (Signed)
He can break it in half ( 5mg  ) and try every other day.  He can discontinue if he continues to have arthralgias  Even with the lower dose.

## 2018-08-20 ENCOUNTER — Other Ambulatory Visit: Payer: Self-pay | Admitting: "Endocrinology

## 2018-08-25 ENCOUNTER — Telehealth: Payer: Self-pay

## 2018-08-25 DIAGNOSIS — E1165 Type 2 diabetes mellitus with hyperglycemia: Secondary | ICD-10-CM

## 2018-08-25 DIAGNOSIS — IMO0002 Reserved for concepts with insufficient information to code with codable children: Secondary | ICD-10-CM

## 2018-08-25 DIAGNOSIS — E118 Type 2 diabetes mellitus with unspecified complications: Principal | ICD-10-CM

## 2018-08-25 DIAGNOSIS — Z794 Long term (current) use of insulin: Principal | ICD-10-CM

## 2018-08-25 MED ORDER — PEN NEEDLES 31G X 6 MM MISC: 1.0000 | Freq: Four times a day (QID) | 2 refills | Status: DC

## 2018-08-25 NOTE — Telephone Encounter (Signed)
SIGNED PEN NEEDLES REFILL

## 2018-08-28 ENCOUNTER — Other Ambulatory Visit: Payer: Self-pay | Admitting: "Endocrinology

## 2018-08-28 ENCOUNTER — Other Ambulatory Visit: Payer: Self-pay

## 2018-08-28 MED ORDER — LEVOTHYROXINE SODIUM 150 MCG PO TABS: ORAL_TABLET | ORAL | 1 refills | Status: DC

## 2018-09-02 ENCOUNTER — Encounter (HOSPITAL_COMMUNITY)
Admission: RE | Admit: 2018-09-02 | Discharge: 2018-09-02 | Disposition: A | Discharge status: Home / Self Care | Payer: BLUE CROSS/BLUE SHIELD | Source: Ambulatory Visit | Attending: "Endocrinology | Admitting: "Endocrinology

## 2018-09-02 DIAGNOSIS — C73 Malignant neoplasm of thyroid gland: Secondary | ICD-10-CM | POA: Insufficient documentation

## 2018-09-02 MED ORDER — STERILE WATER FOR INJECTION IJ SOLN
INTRAMUSCULAR | Status: AC
  Filled 2018-09-02: qty 10

## 2018-09-02 MED ORDER — THYROTROPIN ALFA 1.1 MG IM SOLR
INTRAMUSCULAR | Status: AC
  Administered 2018-09-02: 0.9 mg via INTRAMUSCULAR
  Filled 2018-09-02: qty 0.9

## 2018-09-02 MED ORDER — THYROTROPIN ALFA 1.1 MG IM SOLR
0.9000 mg | INTRAMUSCULAR | Status: AC
  Administered 2018-09-02: 0.9 mg via INTRAMUSCULAR

## 2018-09-03 ENCOUNTER — Encounter (HOSPITAL_COMMUNITY)
Admission: RE | Admit: 2018-09-03 | Discharge: 2018-09-03 | Disposition: A | Discharge status: Home / Self Care | Payer: BLUE CROSS/BLUE SHIELD | Source: Ambulatory Visit | Attending: "Endocrinology | Admitting: "Endocrinology

## 2018-09-03 DIAGNOSIS — C73 Malignant neoplasm of thyroid gland: Secondary | ICD-10-CM

## 2018-09-03 MED ORDER — THYROTROPIN ALFA 1.1 MG IM SOLR
INTRAMUSCULAR | Status: AC
  Administered 2018-09-03: 0.9 mg via INTRAMUSCULAR
  Filled 2018-09-03: qty 0.9

## 2018-09-03 MED ORDER — THYROTROPIN ALFA 1.1 MG IM SOLR
0.9000 mg | INTRAMUSCULAR | Status: AC
  Administered 2018-09-03: 0.9 mg via INTRAMUSCULAR

## 2018-09-03 MED ORDER — STERILE WATER FOR INJECTION IJ SOLN
INTRAMUSCULAR | Status: AC
  Administered 2018-09-03: 1 mL
  Filled 2018-09-03: qty 10

## 2018-09-04 ENCOUNTER — Encounter (HOSPITAL_COMMUNITY): Payer: Self-pay

## 2018-09-04 ENCOUNTER — Encounter (HOSPITAL_COMMUNITY)
Admission: RE | Admit: 2018-09-04 | Discharge: 2018-09-04 | Disposition: A | Discharge status: Home / Self Care | Payer: BLUE CROSS/BLUE SHIELD | Source: Ambulatory Visit | Attending: "Endocrinology | Admitting: "Endocrinology

## 2018-09-04 MED ORDER — SODIUM IODIDE I 131 CAPSULE
4.0000 | Freq: Once | INTRAVENOUS | Status: AC | PRN
  Administered 2018-09-04: 4.3 via ORAL

## 2018-09-07 ENCOUNTER — Encounter (HOSPITAL_COMMUNITY)
Admission: RE | Admit: 2018-09-07 | Discharge: 2018-09-07 | Disposition: A | Discharge status: Home / Self Care | Payer: BLUE CROSS/BLUE SHIELD | Source: Ambulatory Visit | Attending: "Endocrinology | Admitting: "Endocrinology

## 2018-09-15 ENCOUNTER — Other Ambulatory Visit: Payer: Self-pay | Admitting: "Endocrinology

## 2018-10-08 ENCOUNTER — Other Ambulatory Visit: Payer: Self-pay | Admitting: "Endocrinology

## 2018-10-09 LAB — COMPREHENSIVE METABOLIC PANEL
ALT: 38 IU/L (ref 0–44)
AST: 18 IU/L (ref 0–40)
Albumin/Globulin Ratio: 1.9 (ref 1.2–2.2)
Albumin: 4.6 g/dL (ref 3.8–4.8)
Alkaline Phosphatase: 73 IU/L (ref 39–117)
BUN/Creatinine Ratio: 16 (ref 10–24)
BUN: 18 mg/dL (ref 8–27)
Bilirubin Total: 0.4 mg/dL (ref 0.0–1.2)
CO2: 22 mmol/L (ref 20–29)
Calcium: 9.2 mg/dL (ref 8.6–10.2)
Chloride: 100 mmol/L (ref 96–106)
Creatinine, Ser: 1.13 mg/dL (ref 0.76–1.27)
GFR calc Af Amer: 77 mL/min/{1.73_m2} (ref >59)
GFR calc non Af Amer: 67 mL/min/{1.73_m2} (ref >59)
Globulin, Total: 2.4 g/dL (ref 1.5–4.5)
Glucose: 132 mg/dL — ABNORMAL HIGH (ref 65–99)
Potassium: 4.6 mmol/L (ref 3.5–5.2)
Sodium: 140 mmol/L (ref 134–144)
Total Protein: 7 g/dL (ref 6.0–8.5)

## 2018-10-09 LAB — HGB A1C W/O EAG: Hgb A1c MFr Bld: 8.8 % — ABNORMAL HIGH (ref 4.8–5.6)

## 2018-10-09 LAB — TSH: TSH: 3.47 u[IU]/mL (ref 0.450–4.500)

## 2018-10-09 LAB — THYROGLOBULIN BY IMA: Thyroglobulin by IMA: <0.1 ng/mL — ABNORMAL LOW (ref 1.4–29.2)

## 2018-10-09 LAB — TGAB+THYROGLOBULIN IMA OR RIA: Thyroglobulin Antibody: <1 IU/mL (ref 0.0–0.9)

## 2018-10-09 LAB — T4, FREE: Free T4: 1.47 ng/dL (ref 0.82–1.77)

## 2018-10-13 ENCOUNTER — Encounter: Payer: Self-pay | Admitting: "Endocrinology

## 2018-10-13 ENCOUNTER — Ambulatory Visit (INDEPENDENT_AMBULATORY_CARE_PROVIDER_SITE_OTHER): Payer: BLUE CROSS/BLUE SHIELD | Admitting: "Endocrinology

## 2018-10-13 VITALS — BP 134/79 | HR 72 | Resp 14 | Ht 68.0 in | Wt 212.2 lb

## 2018-10-13 DIAGNOSIS — I10 Essential (primary) hypertension: Secondary | ICD-10-CM

## 2018-10-13 DIAGNOSIS — C73 Malignant neoplasm of thyroid gland: Secondary | ICD-10-CM

## 2018-10-13 DIAGNOSIS — E890 Postprocedural hypothyroidism: Secondary | ICD-10-CM | POA: Diagnosis not present

## 2018-10-13 DIAGNOSIS — E118 Type 2 diabetes mellitus with unspecified complications: Secondary | ICD-10-CM | POA: Diagnosis not present

## 2018-10-13 DIAGNOSIS — E782 Mixed hyperlipidemia: Secondary | ICD-10-CM

## 2018-10-13 DIAGNOSIS — IMO0002 Reserved for concepts with insufficient information to code with codable children: Secondary | ICD-10-CM

## 2018-10-13 DIAGNOSIS — Z794 Long term (current) use of insulin: Secondary | ICD-10-CM

## 2018-10-13 DIAGNOSIS — E1165 Type 2 diabetes mellitus with hyperglycemia: Secondary | ICD-10-CM

## 2018-10-13 MED ORDER — EVOLOCUMAB 140 MG/ML ~~LOC~~ SOAJ: 140.0000 mg | SUBCUTANEOUS | 3 refills | Status: DC

## 2018-10-13 NOTE — Patient Instructions (Signed)

## 2018-10-13 NOTE — Progress Notes (Signed)
Endocrinology follow-up note  Subjective:    Patient ID: Dean Hurley, male    DOB: October 29, 1950,    Past Medical History:  Diagnosis Date  . Cancer ([place])   . Diabetes mellitus without complication ([place])   . Hypertension   . Thyroid cancer [facility])    Past Surgical History:  Procedure Laterality Date  . BACK SURGERY    . THYROID SURGERY     Thyroidectomy  . TRANSURETHRAL RESECTION OF PROSTATE     Social History   Socioeconomic History  . Marital status: Married    Spouse name: Not on file  . Number of children: Not on file  . Years of education: Not on file  . Highest education level: Not on file  Occupational History  . Not on file  Social Needs  . Financial resource strain: Not on file  . Food insecurity:    Worry: Not on file    Inability: Not on file  . Transportation needs:    Medical: Not on file    Non-medical: Not on file  Tobacco Use  . Smoking status: Former Smoker    Types: Cigarettes  . Smokeless tobacco: Never Used  Substance and Sexual Activity  . Alcohol use: No    Alcohol/week: 0.0 standard drinks  . Drug use: No  . Sexual activity: Not on file  Lifestyle  . Physical activity:    Days per week: Not on file    Minutes per session: Not on file  . Stress: Not on file  Relationships  . Social connections:    Talks on phone: Not on file    Gets together: Not on file    Attends religious service: Not on file    Active member of club or organization: Not on file    Attends meetings of clubs or organizations: Not on file    Relationship status: Not on file  Other Topics Concern  . Not on file  Social History Narrative  . Not on file   Outpatient Encounter Medications as of 10/13/2018  Medication Sig  . alum & mag hydroxide-simeth (MAALOX/MYLANTA) 200-200-20 MG/5ML suspension Take 20 mLs by mouth every 6 (six) hours as needed for heartburn.  . Continuous Blood Gluc Sensor (FREESTYLE LIBRE 14 DAY SENSOR) MISC USE AS  DIRECTED X14 DAYS  . Insulin Detemir (LEVEMIR FLEXTOUCH) 100 UNIT/ML Pen INJECT 80 UNITS SUBCUTANEOUSLY AT BEDTIME. (Patient taking differently: Inject 80 Units into the skin. INJECT 80 UNITS SUBCUTANEOUSLY AT BEDTIME.)  . Insulin Detemir (LEVEMIR) 100 UNIT/ML Pen INJECT 80 UNITS INTO THE SKIN AT BEDTIME.  . insulin lispro (HUMALOG KWIKPEN) 100 UNIT/ML KiwkPen Inject 0.15-0.21 mLs (15-21 Units total) into the skin 3 (three) times daily before meals.  . Insulin Pen Needle (PEN NEEDLES) 31G X 6 MM MISC 1 each by Does not apply route 4 (four) times daily.  Marland Kitchen levothyroxine (SYNTHROID, LEVOTHROID) 150 MCG tablet TAKE 1 TABLET BY MOUTH EVERY DAY BEFORE BREAKFAST  . lisinopril-hydrochlorothiazide (PRINZIDE,ZESTORETIC) 20-12.5 MG tablet TAKE ONE TABLET BY MOUTH DAILY  . meclizine (ANTIVERT) 25 MG tablet Take 25 mg by mouth 3 (three) times daily as needed for dizziness.  . meloxicam (MOBIC) 15 MG tablet Take 15 mg by mouth daily.  . metFORMIN (GLUCOPHAGE) 1000 MG tablet TAKE 1 TABLET BY MOUTH TWICE A DAY WITH A MEAL (Patient taking differently: Take  1,000 mg by mouth 2 (two) times daily with a meal. )  . rosuvastatin (CRESTOR) 10 MG tablet Take 1 tablet (10 mg total) by mouth at bedtime.  . triamcinolone cream (KENALOG) 0.1 % Apply 1 application topically daily as needed (for scaly skin).   . Evolocumab (REPATHA SURECLICK) 530 MG/ML SOAJ Inject 140 mg into the skin every 14 (fourteen) days.  . [DISCONTINUED] meloxicam (MOBIC) 7.5 MG tablet Take 1 tablet by mouth daily.   No facility-administered encounter medications on file as of 10/13/2018.    ALLERGIES: Allergies  Allergen Reactions  . Codeine   . Contrast Media [Iodinated Diagnostic Agents]    VACCINATION STATUS:  There is no immunization history on file for this patient.  HPI   68 yr old male who underwent total thyroidectomy in [facility] on June 02, 2012.  His total thyroidectomy showed BRAF positive multicentric  thyroid cancer. 1.7 cm cancer nodule on the right lobe and 0.4 cm cancer nodule on the left lobe. Partial invasion of capsule, margins negative. He underwent thyrogen stimulated I131  thyroid remnant ablation with negative whole body scan for distant metastasis on July 17, 2013.    May 2014 thyroid /neck ultrasound :  negative for any mass lesion.   Thyrogen stimulated WBS on 10/25/13 was c/w successful ablation of thyroid remnants with no evidence of local recurrence or metastatic disease. He has  had another thyroid ultrasound on 10/11/14 which was negative for residual thyroid tissue. On his next  surveillance thyroid ultrasound on September 6,2016 however he has had a 9 mm residual tissue on the  right thyroid bed, which grew to 12 mm in follow-up ultrasound in December 2016. He was sent to [facility] where he had his prior surgery. Fine needle  aspiration of the residual tissue did not reveal diagnostic material.  - On thyroid/neck ultrasound  on 02/01/2016 showed that the remnant thyroid tissue decreased in size to 1.0 cm, all other  findings unchanged.  - His last  Thyrogen stimulated whole-body scan on  06/21/2016 was  reported to be negative for any iodine avid metastatic thyroid cancer.  - On 06/27/2017 : His previsit ultrasound at an outside facility in [facility] revealed an echogenic focus measuring 1.7cm x 0.7cm x 1.1 cm ( previously 1 cm-1.2 cm in [facility] imaging on 02/01/2016) potentially representing a small amount of residual thyroid tissue versus small lymph node. There is no identifiable tissue on the left, no identifiable isthmus.  -He pushed to get his previsit ultrasound prematurely on August 08, 2017 which showed no significant change in the thyroid remnant at 1.2 cm.  -His most recent  repeat surveillance thyroid/neck ultrasound on February 02, 2018 is unchanged showing stable 1.2 cm nodular tissue ithin the right lobectomy bed, unchanged since December  2016.   September 07, 2018: Thyrogen stimulated whole-body scan negative for any iodine avid metastatic thyroid cancer.    He is currently on Synthroid 150 mcg po qam. He reports compliance to this medication.   He is also being treated for type 2 DM , not very compliant to this diagnosis.  Currently using CGM device, did not document his insulin administration records.   His average blood glucose for the last 90 days is 176.  His previsit labs show A1c of 8.8%, improving from 10%. No hypoglycemia reported nor documented on his CGM device.    Review of Systems  Constitutional:  + Progressive weight gain,   + fatigue,  no subjective hyperthermia nor hyponatremia.    Eyes: no blurry vision, no xerophthalmia ENT: no sore throat,  S/p thyroidectomy, no dysphagia, odynophagia, change in voice. Cardiovascular: No chest pain, no palpitations. Respiratory: no cough, no shortness of breath.  Gastrointestinal: no N/V/D, + constipation Musculoskeletal: no muscle/joint aches, + back pain Skin: no rashes Neurological: Denies tremors, numbness, tingling.   Psychiatric: no depression/anxiety   Objective:    BP 134/79   Pulse 72   Resp 14   Ht _0  (1.727 m)   Wt 212 lb 3.2 oz (96.3 kg)   SpO2 95%   BMI 32.26 kg/m   Wt Readings from Last 3 Encounters:  10/13/18 212 lb 3.2 oz (96.3 kg)  06/11/18 206 lb (93.4 kg)  05/17/18 205 lb (93 kg)    Physical Exam  Constitutional: + Obese, alert and oriented x3, not in acute distress.    Eyes: PERRLA, EOMI, no exophthalmos ENT: moist mucous membranes, +thyroidectomy scar , no cervical mass or lymphadenopathy.  Musculoskeletal: no deformities, strength intact in all 4 Skin: moist, warm, no rashes Neurological: No tremors of outstretched hands.    CMP Latest Ref Rng & Units 10/08/2018 05/17/2018 05/12/2018  Glucose 65 - 99 mg/dL 132(H) 243(H) 166(H)  BUN 8 - 27 mg/dL _1 Creatinine 0.76 - 1.27 mg/dL 1.13 1.06 1.16  Sodium 134 - 144 mmol/L  140 138 141  Potassium 3.5 - 5.2 mmol/L 4.6 4.0 5.0  Chloride 96 - 106 mmol/L 100 106 100  CO2 20 - 29 mmol/L _2 Calcium 8.6 - 10.2 mg/dL 9.2 8.8(L) 10.4(H)  Total Protein 6.0 - 8.5 g/dL 7.0 - 7.6  Total Bilirubin 0.0 - 1.2 mg/dL 0.4 - 0.4  Alkaline Phos 39 - 117 IU/L 73 - 77  AST 0 - 40 IU/L 18 - 13  ALT 0 - 44 IU/L 38 - 32      Thyrogen stimulated whole-body scan on 06/21/2016: IMPRESSION: Negative whole-body I 131 scan for recurrent iodine avid thyroid Cancer.  06/14/2016 thyroglobulin antibodies negative, thyroglobulin by IME less than 0.1  06/27/2017 - His previsit ultrasound at an outside facility in [facility] revealed an echogenic focus measuring 1.7cm x 0.7cm x 1.1 cm ( previously 1 cm-1.2 cm in [facility] imaging on 02/01/2016) potentially representing a small amount of residual thyroid tissue versus small lymph node. There is no identifiable tissue on the left, no identifiable isthmus.  August 08, 2018 thyroid/neck ultrasound in [facility]:   FINDINGS: Residual solid nodular isoechoic tissue in the right thyroid noted measures 1.2 x 1.0 x 1.0 cm, previously 1.0 x 0.7 x 0.8 cm. No significant interval change. No new abnormality.  Similar ultrasound findings on February 02, 2018. See above.  September 07, 2018 thyroid stimulated whole-body scan negative for iodine avid distant metastasis.  Thyroglobulin by IMA on October 08, 2018 undetectable.   Results for MELBERT, BOTELHO (MRN [id number]) as of 10/13/2018 13:06  Ref. Range 10/08/2018 09:46  TSH Latest Ref Range: 0.450 - 4.500 uIU/mL 3.470  T4,Free(Direct) Latest Ref Range: 0.82 - 1.77 ng/dL 1.47  Thyroglobulin Antibody Latest Ref Range: 0.0 - 0.9 IU/mL <1.0    Assessment & Plan:   1.  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 on July 17, 2012, thyrogen  stimulated RAI thyroid remnant ablation given his BRAF  positive presentation.  The post therapy scan was negative for distant metastasis. His May 2014 thyroid ultrasound is negative for remnant tissue.  Subsequent surveillance thyrogen stimulated WBS on 10/25/13 was c/w successful ablation of thyroid remnants, with no evidence of local recurrence nor metastatic disease.  His repeat thyroid ultrasound on 10/11/14 showed no residual thyroid tissue. However, on his last surveillance thyroid/neck ultrasound she was found to have a new 51m residual/recurrent soft tissue nodule in the right  thyroidectomy bed. On subsequent surveillance thyroid ultrasound this tissue was found to be larger at 12 mm. Attempt to biopsy this up to [facility] was not diagnostic.   - On thyroid/neck ultrasound before this visit on 02/01/2016 showed that the remnant thyroid tissue decreased in size to 1.0 cm, all other  findings unchanged.  There was no sign of residual thyroid tissue on whole-body scan performed 2015.  She is thyroglobulin by IMA was less than 0.1 associated with negative thyroglobulin antibodies on 06/14/2016.  - Thyrogen stimulated whole-body scan on 06/21/2016 was negative for iodine avid metastatic thyroid cancer.   - Most recent thyroid/neck ultrasound on 06/27/2017 showed persistence of possibly thyroid tissue in the right thyroid lobe bed- this was done within a different facility than his prior imaging studies-subject to variations of measurements.   -His previsit thyroid/neck ultrasound was done on August 08, 2017 prematurely due to his insistence. -The report was that there was no major interval change in the thyroid remnant on the right thyroid bed.  - [facility] shows stable finding of 1.2 cm nodular area within the right lobectomy bed. No intervention at this time. His recent whole-body scan after Thyrogen stimulation was negative  for distant metastasis on September 07, 2018.    -He will not need active intervention at this time.  Will be considered for ultrasound of neck/thyroid in 1 year.   2. Postsurgical hypothyroidism -His labs are consistent with appropriate replacement.  He is advised to continue Synthroid 150 mcg p.o. daily before breakfast.    - We discussed about the correct intake of his thyroid hormone, on empty stomach at fasting, with water, separated by at least 30 minutes from breakfast and other medications,  and separated by more than 4 hours from calcium, iron, multivitamins, acid reflux medications (PPIs). -Patient is made aware of the fact that thyroid hormone replacement is needed for life, dose to be adjusted by periodic monitoring of thyroid function tests.   3. Uncontrolled type 2 diabetes mellitus with complication, with long-term current use of insulin ([place] - He has a history of noncompliance.  His recent A1c is unchanged at 8.8%, progressively improving from 10%.   -He will continue to require intensive treatment with basal/bolus insulin.  He did bring his insulin injection records to review.  -His CGM device shows average blood glucose of 176 over the last 90 days.  He is advised to continue Levemir 80 units nightly, continue NovoLog 15 units  3 times daily before meals for pre-meal blood glucose readings above 90 mg/dL, plus patient specific correction for readings above 150 mg/dL.    - He is urged to wear his  CGM device at all times and to continue documenting his blood glucose at least 4 times a day-before meals and at bedtime.    -Advised to continue metformin 1000 mg p.o. twice daily after breakfast and supper.   4. Hyperlipidemia: Uncontrolled with a recent LDL of 126. Discussed and increase  his Crestor to 10 mg p.o. nightly.   5. Essential hypertension:  His blood pressure is controlled to target.  He is advised to continue on his current medications including lisinopril/HCTZ  20/12.5 mg p.o. daily at breakfast.    I advised patient to maintain close follow up with his PCP for primary care needs.  - Time spent with the patient: 25 min, of which >50% was spent in reviewing his blood glucose logs , discussing his hypoglycemia and hyperglycemia episodes, reviewing his current and  previous labs / studies and medications  doses and developing a plan to avoid hypoglycemia and hyperglycemia. Please refer to Patient Instructions for Blood Glucose Monitoring and Insulin/Medications Dosing Guide"  in media tab for additional information. Stevie Kern participated in the discussions, expressed understanding, and voiced agreement with the above plans.  All questions were answered to his satisfaction. he is encouraged to contact clinic should he have any questions or concerns prior to his return visit.   Follow up plan: Return in about 4 months (around 02/12/2019) for Follow up with Pre-visit Labs, Meter, and Logs.  Glade Lloyd, MD Phone: [phone]  Fax: [phone]   This note was partially dictated with voice recognition software. Similar sounding words can be transcribed inadequately or may not  be corrected upon review.  10/13/2018, 1:04 PM

## 2018-11-04 ENCOUNTER — Other Ambulatory Visit: Payer: Self-pay | Admitting: "Endocrinology

## 2018-11-09 ENCOUNTER — Other Ambulatory Visit: Payer: Self-pay | Admitting: "Endocrinology

## 2018-11-21 ENCOUNTER — Other Ambulatory Visit: Payer: Self-pay | Admitting: "Endocrinology

## 2018-11-24 ENCOUNTER — Other Ambulatory Visit: Payer: Self-pay | Admitting: "Endocrinology

## 2018-12-06 ENCOUNTER — Other Ambulatory Visit: Payer: Self-pay | Admitting: "Endocrinology

## 2018-12-08 ENCOUNTER — Other Ambulatory Visit: Payer: Self-pay | Admitting: "Endocrinology

## 2019-02-08 ENCOUNTER — Other Ambulatory Visit: Payer: Self-pay | Admitting: "Endocrinology

## 2019-02-15 ENCOUNTER — Other Ambulatory Visit: Payer: Self-pay | Admitting: "Endocrinology

## 2019-02-25 ENCOUNTER — Ambulatory Visit: Payer: BLUE CROSS/BLUE SHIELD | Admitting: "Endocrinology

## 2019-03-05 ENCOUNTER — Other Ambulatory Visit: Payer: Self-pay | Admitting: "Endocrinology

## 2019-03-05 IMAGING — US US THYROID
1 series · 14 of 24 positions shown · non-contrast
Comparison: 02/01/2016

CLINICAL DATA: Thyroidectomy  thyroid cancer

EXAM:
THYROID ULTRASOUND
TECHNIQUE: Ultrasound examination of the thyroid gland and adjacent soft
tissues was performed.

[Series 1: us thyroid · 0.07mm/px · 14 of 24 slices shown]
[im 1/24]
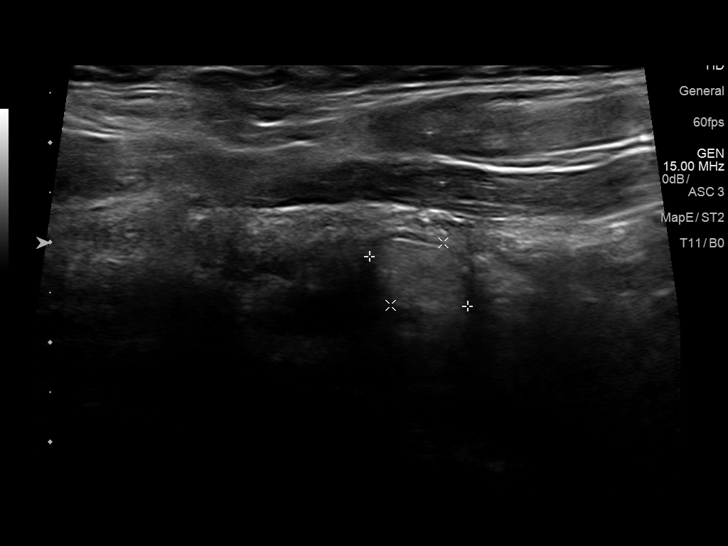
[im 3/24]
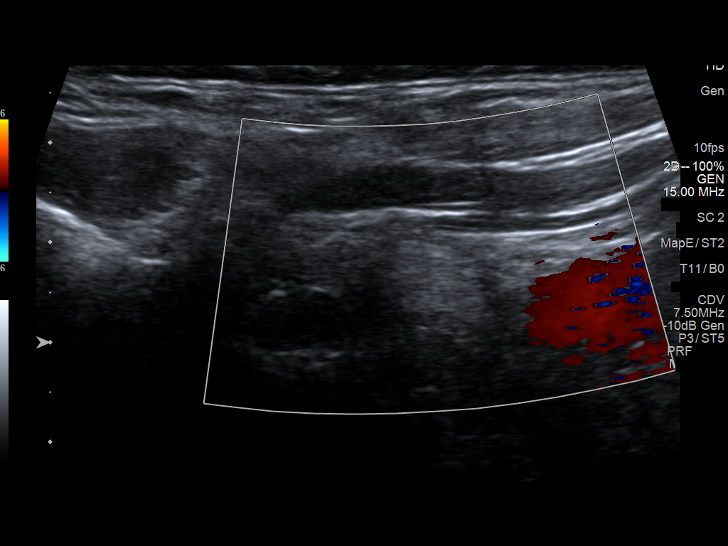
[im 5/24]
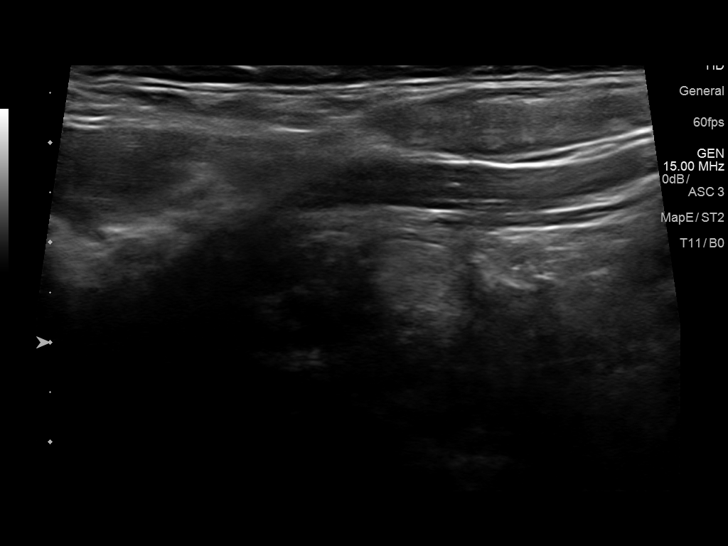
[im 7/24]
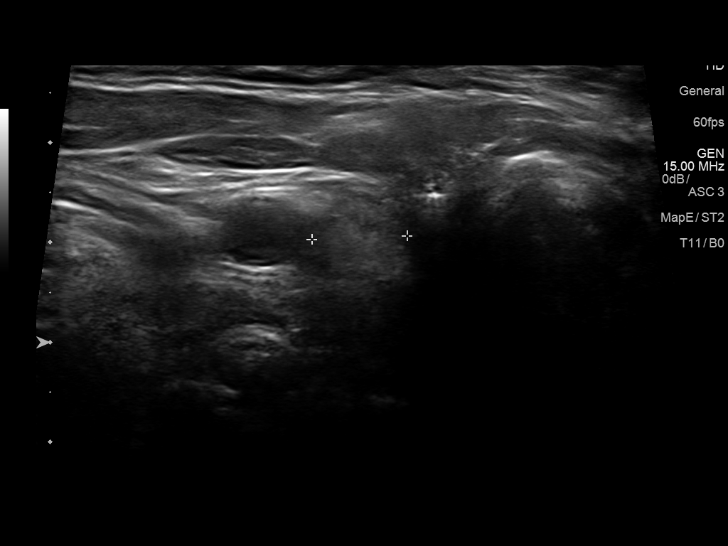
[im 8/24]
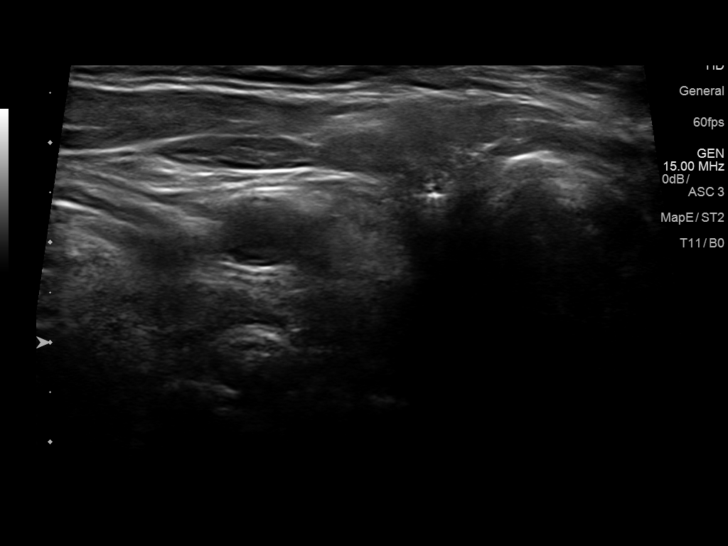
[im 10/24]
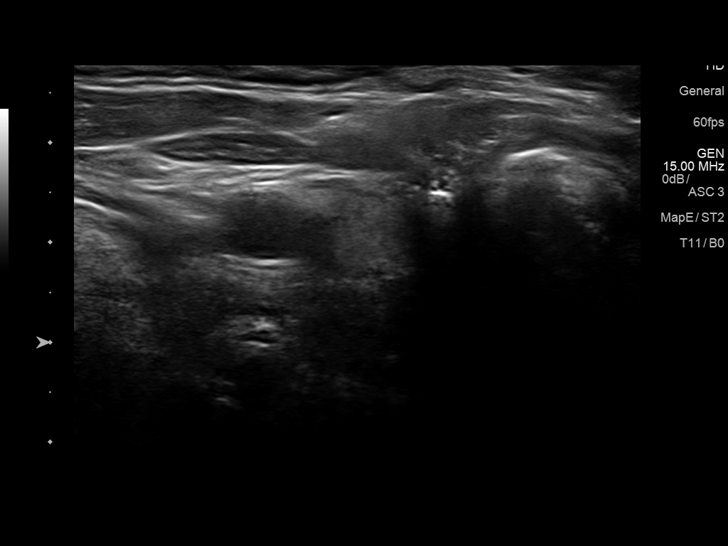
[im 12/24]
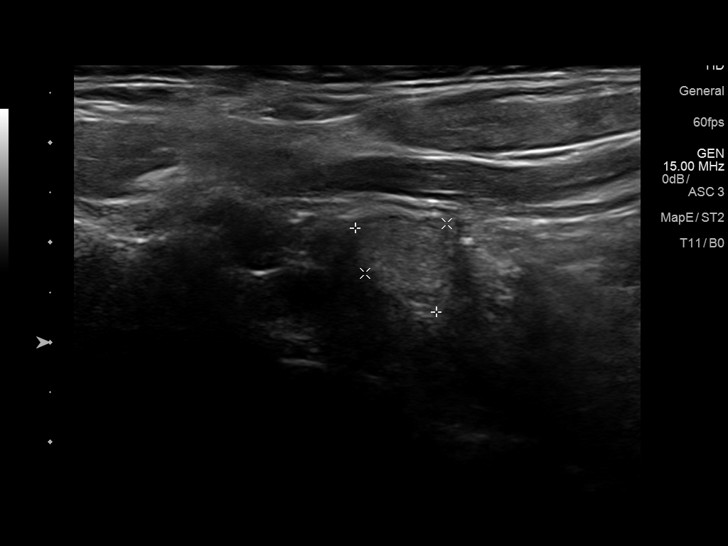
[im 13/24]
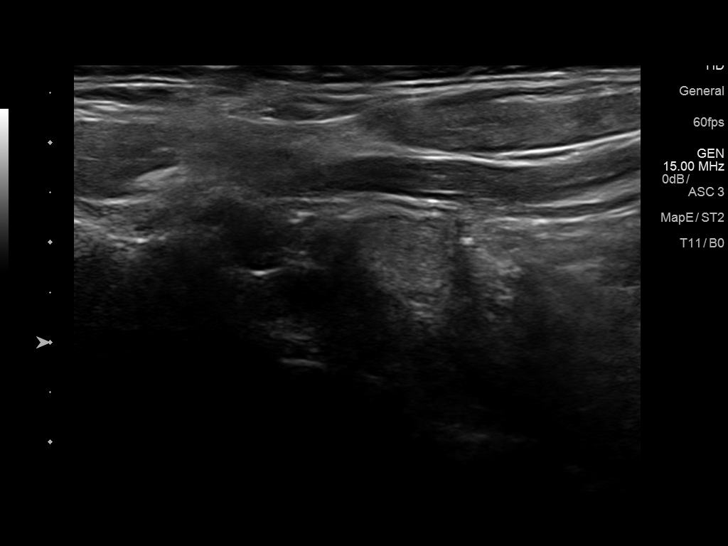
[im 15/24]
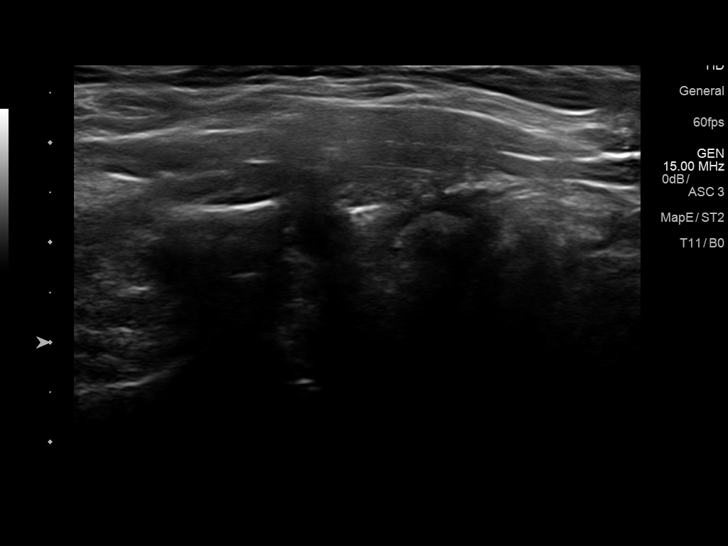
[im 17/24]
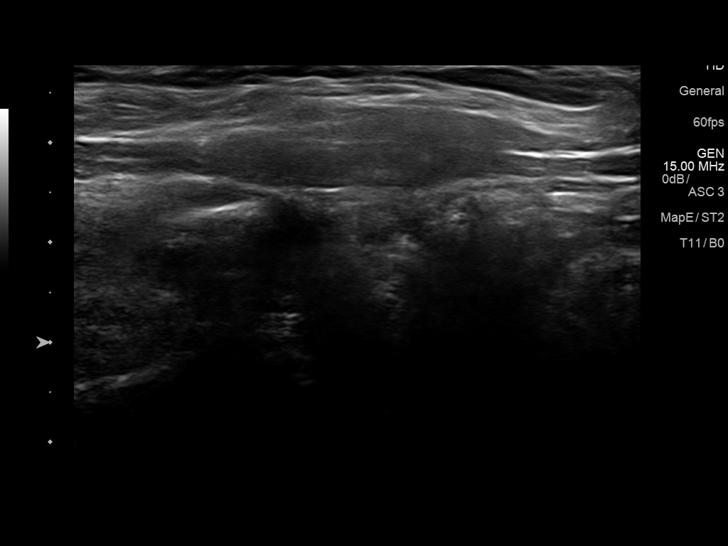
[im 19/24]
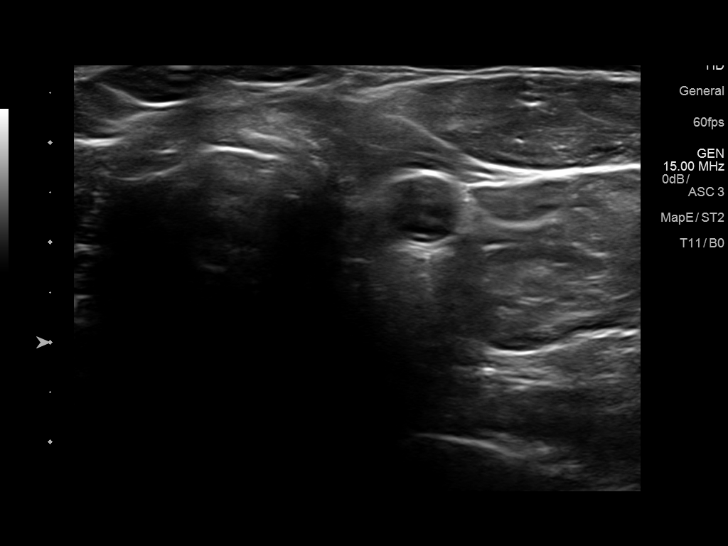
[im 20/24]
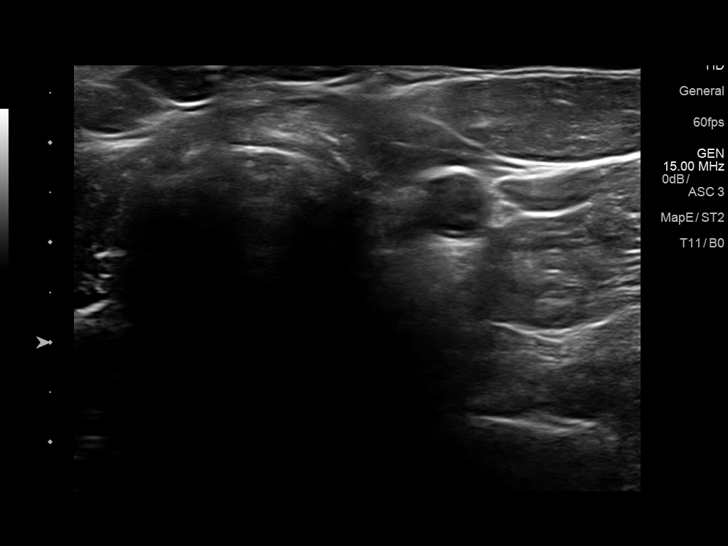
[im 22/24]
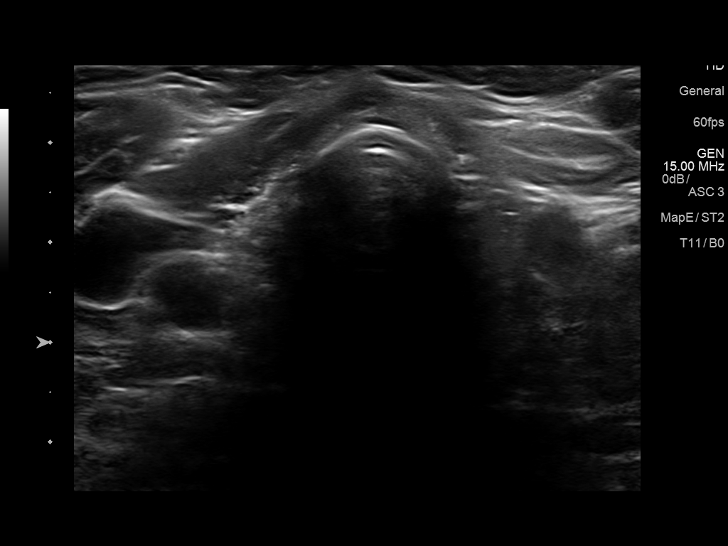
[im 24/24]
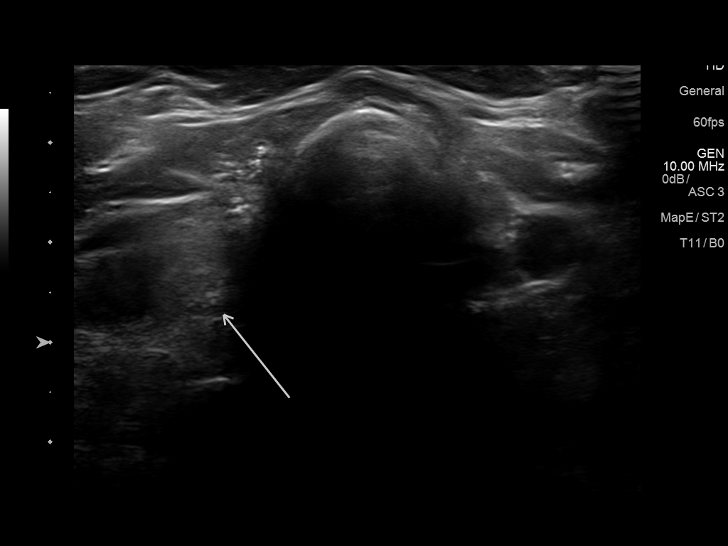

[14 of 24 positions shown; findings below may reference images not displayed]

FINDINGS: Residual solid nodular isoechoic tissue in the right thyroid noted
measures 1.2 x 1.0 x 1.0 cm, previously 1.0 x 0.7 x 0.8 cm. No
significant interval change. No new abnormality.

_________________________________________________________

Estimated total number of nodules >/= 1 cm: 0

Number of spongiform nodules >/=  2 cm not described below (TR1): 0

Number of mixed cystic and solid nodules >/= 1.5 cm not described
below (TR2): 0

_________________________________________________________

No adenopathy.
IMPRESSION: Stable right thyroid bed nodular isoechoic thyroid tissue measuring
up to 1.2 cm. No new finding. No adenopathy.

The above is in keeping with the ACR TI-RADS recommendations - [HOSPITAL] 2324;[DATE].

## 2019-03-15 ENCOUNTER — Ambulatory Visit: Payer: BLUE CROSS/BLUE SHIELD | Admitting: "Endocrinology

## 2019-05-07 ENCOUNTER — Other Ambulatory Visit: Payer: Self-pay | Admitting: "Endocrinology

## 2019-05-17 ENCOUNTER — Other Ambulatory Visit: Payer: Self-pay | Admitting: "Endocrinology

## 2019-05-17 NOTE — Telephone Encounter (Signed)
Last seen 10/30/18

## 2019-05-30 ENCOUNTER — Other Ambulatory Visit: Payer: Self-pay | Admitting: "Endocrinology

## 2019-06-02 ENCOUNTER — Other Ambulatory Visit: Payer: Self-pay | Admitting: "Endocrinology

## 2019-06-05 ENCOUNTER — Other Ambulatory Visit: Payer: Self-pay | Admitting: "Endocrinology

## 2019-06-23 ENCOUNTER — Other Ambulatory Visit: Payer: Self-pay | Admitting: "Endocrinology

## 2019-07-23 LAB — LIPID PANEL
Cholesterol: 211 — AB (ref 0–200)
HDL: 44 (ref 35–70)
LDL Cholesterol: 136
Triglycerides: 170 — AB (ref 40–160)

## 2019-07-23 LAB — BASIC METABOLIC PANEL
BUN: 20 (ref 4–21)
Creatinine: 1.2 (ref 0.6–1.3)

## 2019-07-23 LAB — TSH: TSH: 12.8 — AB (ref 0.41–5.90)

## 2019-07-23 LAB — HEMOGLOBIN A1C: Hemoglobin A1C: 9.1

## 2019-08-03 IMAGING — NM NM [ID] THYROID CANCER METS WHOLE BODY W/ THYROGEN
3 series · 3 of 3 positions shown · non-contrast
Comparison: 06/21/2016

CLINICAL DATA: Thyroid cancer post thyroidectomy and postoperative
radioactive iodine ablation

EXAM:
THYROGEN-STIMULATED 5-P1P WHOLE BODY SCAN
TECHNIQUE: The patient received 0.9 mg Thyrogen intramuscularly every 24 hours
for two doses. On the third day the patient returned and received
the radiopharmaceutical, per orally. On the fifth day, the patient
returned and whole body planar images were obtained in the anterior
and posterior projections.
RADIOPHARMACEUTICALS:  4.3 mCi 5-P1P sodium iodide orally

[Series 1: i131 whole body · 2.66mm/px · 1 of 1 slices shown]
[im 1/1  full-range]
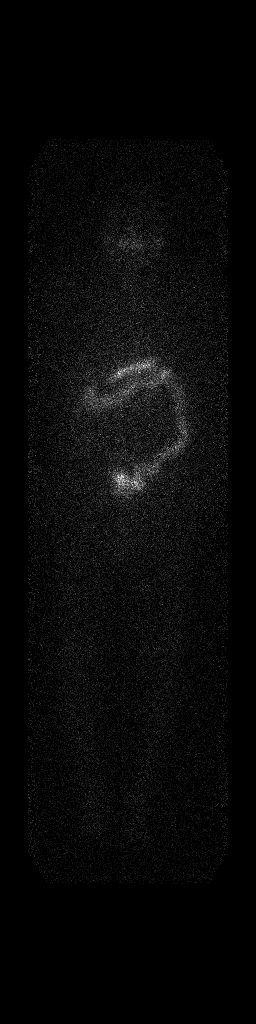

[Series 2: static with marker · 4.14mm/px · 1 of 1 slices shown]
[im 1/1  full-range]
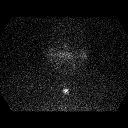

[Series 3: static without marker · non-contrast · 4.14mm/px · 1 of 1 slices shown]
[im 1/1  full-range]
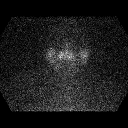

[3 of 3 positions shown; findings below may reference images not displayed]

FINDINGS: Excreted tracer within stomach, colon, and bladder.

No residual radio iodine accumulation at the cervical region.

No abnormal radio iodine accumulation is seen to suggest iodine-avid
metastatic thyroid cancer.
IMPRESSION: No scintigraphic evidence of iodine-avid metastatic thyroid cancer.

## 2019-08-11 ENCOUNTER — Encounter: Payer: Self-pay | Admitting: "Endocrinology

## 2019-08-11 ENCOUNTER — Ambulatory Visit (INDEPENDENT_AMBULATORY_CARE_PROVIDER_SITE_OTHER): Payer: BLUE CROSS/BLUE SHIELD | Admitting: "Endocrinology

## 2019-08-11 DIAGNOSIS — I10 Essential (primary) hypertension: Secondary | ICD-10-CM

## 2019-08-11 DIAGNOSIS — E1165 Type 2 diabetes mellitus with hyperglycemia: Secondary | ICD-10-CM

## 2019-08-11 DIAGNOSIS — E890 Postprocedural hypothyroidism: Secondary | ICD-10-CM

## 2019-08-11 DIAGNOSIS — E118 Type 2 diabetes mellitus with unspecified complications: Secondary | ICD-10-CM | POA: Diagnosis not present

## 2019-08-11 DIAGNOSIS — IMO0002 Reserved for concepts with insufficient information to code with codable children: Secondary | ICD-10-CM

## 2019-08-11 DIAGNOSIS — C73 Malignant neoplasm of thyroid gland: Secondary | ICD-10-CM

## 2019-08-11 DIAGNOSIS — E782 Mixed hyperlipidemia: Secondary | ICD-10-CM

## 2019-08-11 DIAGNOSIS — Z794 Long term (current) use of insulin: Secondary | ICD-10-CM

## 2019-08-11 MED ORDER — LEVOTHYROXINE SODIUM 175 MCG PO CAPS: 175.0000 ug | ORAL_CAPSULE | Freq: Every day | ORAL | 2 refills | Status: DC

## 2019-08-11 NOTE — Progress Notes (Signed)
08/11/2019                                                    Endocrinology Telehealth Visit Follow up Note -During COVID -19 Pandemic  This visit type was conducted due to national recommendations for restrictions regarding the COVID-19 Pandemic  in an effort to limit this patient's exposure and mitigate transmission of the corona virus.  Due to his co-morbid illnesses, Dean Hurley is at  moderate to high risk for complications without adequate follow up.  This format is felt to be most appropriate for him at this time.  I connected with this patient on 08/11/2019   by telephone and verified that I am speaking with the correct person using two identifiers. Dean Hurley, Oct 31, 1950. he has verbally consented to this visit. All issues noted in this document were discussed and addressed. The format was not optimal for physical exam.    Subjective:    Patient ID: Dean Hurley, male    DOB: October 20, 1950,   Chief complaint  Follow-up for 1)  History of thyroid malignancy 2) postsurgical hypothyroidism 3)  Uncontrolled type 2 diabetes  4) hyperlipidemia 5) hypertension   Past Medical History:  Diagnosis Date  . Cancer ([place])   . Diabetes mellitus without complication ([place])   . Hypertension   . Thyroid cancer [facility])    Past Surgical History:  Procedure Laterality Date  . BACK SURGERY    . THYROID SURGERY     Thyroidectomy  . TRANSURETHRAL RESECTION OF PROSTATE     Social History   Socioeconomic History  . Marital status: Married    Spouse name: Not on file  . Number of children: Not on file  . Years of education: Not on file  . Highest education level: Not on file  Occupational History  . Not on file  Tobacco Use  . Smoking status: Former Smoker    Types: Cigarettes  . Smokeless tobacco: Never Used  Substance and Sexual Activity  . Alcohol use: No    Alcohol/week: 0.0 standard drinks  . Drug use: No  . Sexual activity: Not on file  Other Topics  Concern  . Not on file  Social History Narrative  . Not on file   Social Determinants of Health   Financial Resource Strain:   . Difficulty of Paying Living Expenses: Not on file  Food Insecurity:   . Worried About Charity fundraiser in the Last Year: Not on file  . Ran Out of Food in the Last Year: Not on file  Transportation Needs:   . Lack of Transportation (Medical): Not on file  . Lack of Transportation (Non-Medical): Not on file  Physical Activity:   . Days of Exercise per Week: Not on file  . Minutes of Exercise per Session: Not on file  Stress:   . Feeling of Stress : Not on file  Social Connections:   . Frequency of Communication with Friends and Family: Not on file  . Frequency of Social Gatherings with Friends and Family: Not on file  . Attends Religious Services: Not on file  . Active Member of Clubs or Organizations: Not on file  . Attends Archivist Meetings: Not on file  . Marital Status: Not on  file   Outpatient Encounter Medications as of 08/11/2019  Medication Sig  . insulin lispro (HUMALOG) 100 UNIT/ML KwikPen Inject 18-24 Units into the skin 3 (three) times daily before meals.  Marland Kitchen alum & mag hydroxide-simeth (MAALOX/MYLANTA) 200-200-20 MG/5ML suspension Take 20 mLs by mouth every 6 (six) hours as needed for heartburn.  . Continuous Blood Gluc Sensor (FREESTYLE LIBRE 14 DAY SENSOR) MISC USE AS DIRECTED FOR 14 DAYS  . Evolocumab (REPATHA SURECLICK) 537 MG/ML SOAJ Inject 140 mg into the skin every 14 (fourteen) days.  . Insulin Detemir (LEVEMIR FLEXTOUCH) 100 UNIT/ML Pen INJECT 80 UNITS SUBCUTANEOUSLY AT BEDTIME. (Patient taking differently: Inject 80 Units into the skin. INJECT 80 UNITS SUBCUTANEOUSLY AT BEDTIME.)  . Insulin Pen Needle (PEN NEEDLES) 31G X 6 MM MISC 1 each by Does not apply route 4 (four) times daily.  . Levothyroxine Sodium 175 MCG CAPS Take 175 mcg by mouth daily before breakfast.  . lisinopril-hydrochlorothiazide (ZESTORETIC) 20-12.5  MG tablet TAKE 1 TABLET BY MOUTH EVERY DAY  . meclizine (ANTIVERT) 25 MG tablet Take 25 mg by mouth 3 (three) times daily as needed for dizziness.  . meloxicam (MOBIC) 15 MG tablet Take 15 mg by mouth daily.  . metFORMIN (GLUCOPHAGE) 1000 MG tablet TAKE 1 TABLET BY MOUTH TWICE A DAY WITH MEALS  . rosuvastatin (CRESTOR) 10 MG tablet TAKE 1 TABLET BY MOUTH EVERYDAY AT BEDTIME  . triamcinolone cream (KENALOG) 0.1 % Apply 1 application topically daily as needed (for scaly skin).   . [DISCONTINUED] HUMALOG KWIKPEN 100 UNIT/ML KwikPen INJECT 10-16 UNITS SUBCUTANEOUSLY 3 TIMES A DAY BEFORE MEALS  . [DISCONTINUED] insulin lispro (HUMALOG KWIKPEN) 100 UNIT/ML KiwkPen Inject 0.15-0.21 mLs (15-21 Units total) into the skin 3 (three) times daily before meals.  . [DISCONTINUED] LEVEMIR FLEXTOUCH 100 UNIT/ML Pen INJECT 80 UNITS INTO THE SKIN AT BEDTIME.  . [DISCONTINUED] levothyroxine (SYNTHROID) 150 MCG tablet TAKE 1 TABLET BY MOUTH EVERY DAY BEFORE BREAKFAST   No facility-administered encounter medications on file as of 08/11/2019.   ALLERGIES: Allergies  Allergen Reactions  . Codeine   . Contrast Media [Iodinated Diagnostic Agents]    VACCINATION STATUS:  There is no immunization history on file for this patient.  Diabetes     68 yr old male who underwent total thyroidectomy in [facility] on June 02, 2012.  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 1.7 cm cancer nodule on the right lobe and 0.4 cm cancer nodule on the left lobe. Partial invasion of capsule, margins negative. He underwent thyrogen stimulated I131  thyroid remnant ablation with negative whole body scan for distant metastasis on July 17, 2013.    May 2014 thyroid /neck ultrasound :  negative for any mass lesion.   Thyrogen stimulated WBS on 10/25/13 was c/w successful ablation of thyroid remnants with no evidence of local recurrence or metastatic disease. He has  had another  thyroid ultrasound on 10/11/14 which was negative for residual thyroid tissue. On his next  surveillance thyroid ultrasound on September 6,2016 however he has had a 9 mm residual tissue on the  right thyroid bed, which grew to 12 mm in follow-up ultrasound in December 2016. He was sent to [facility] where he had his prior surgery. Fine needle  aspiration of the residual tissue did not reveal diagnostic material.  - On thyroid/neck ultrasound  on 02/01/2016 showed that the remnant thyroid tissue decreased in size to 1.0 cm, all other  findings unchanged.  - His last  Thyrogen stimulated whole-body scan on  06/21/2016 was  reported to be negative for any iodine avid metastatic thyroid cancer.  - On 06/27/2017 : His previsit ultrasound at an outside facility in [facility] revealed an echogenic focus measuring 1.7cm x 0.7cm x 1.1 cm ( previously 1 cm-1.2 cm in [facility] imaging on 02/01/2016) potentially representing a small amount of residual thyroid tissue versus small lymph node. There is no identifiable tissue on the left, no identifiable isthmus.  -He pushed to get his previsit ultrasound prematurely on August 08, 2017 which showed no significant change in the thyroid remnant at 1.2 cm.  -His most recent  repeat surveillance thyroid/neck ultrasound on February 02, 2018 is unchanged showing stable 1.2 cm nodular tissue ithin the right lobectomy bed, unchanged since December 2016.   September 07, 2018: Thyrogen stimulated whole-body scan negative for any iodine avid metastatic thyroid cancer.  Tragically, she no showed since March 2020. He was called for a visit after he was found out that his TSH was above target from his annual physical lab work done by his PMD.    He remains on Synthroid 150 mcg p.o. daily before breakfast.  He reports compliance to this medication.  For his uncontrolled type 2 diabetes, historically not very compliant to his diagnosis.  He was given that freestyle  libre CGM device during his last visit.  His previsit A1c is 9.1%, increasing from 8.8%.  He was supposed to be on Levemir 80 units nightly, Humalog 15 units 3 times daily AC, as well as Metformin 1000 mg p.o. twice daily. He does not have his logs with him today, reports off of his CGM device averaging between 195-210 for the last 30 days.  No hypoglycemia reported nor documented .  Review of Systems Limited as above.   Objective:    There were no vitals taken for this visit.  Wt Readings from Last 3 Encounters:  10/13/18 212 lb 3.2 oz (96.3 kg)  06/11/18 206 lb (93.4 kg)  05/17/18 205 lb (93 kg)    Physical Exam     CMP Latest Ref Rng & Units 10/08/2018 05/17/2018 05/12/2018  Glucose 65 - 99 mg/dL 132(H) 243(H) 166(H)  BUN 8 - 27 mg/dL '18 16 22  ' Creatinine 0.76 - 1.27 mg/dL 1.13 1.06 1.16  Sodium 134 - 144 mmol/L 140 138 141  Potassium 3.5 - 5.2 mmol/L 4.6 4.0 5.0  Chloride 96 - 106 mmol/L 100 106 100  CO2 20 - 29 mmol/L '22 23 22  ' Calcium 8.6 - 10.2 mg/dL 9.2 8.8(L) 10.4(H)  Total Protein 6.0 - 8.5 g/dL 7.0 - 7.6  Total Bilirubin 0.0 - 1.2 mg/dL 0.4 - 0.4  Alkaline Phos 39 - 117 IU/L 73 - 77  AST 0 - 40 IU/L 18 - 13  ALT 0 - 44 IU/L 38 - 32      Thyrogen stimulated whole-body scan on 06/21/2016: IMPRESSION: Negative whole-body I 131 scan for recurrent iodine avid thyroid Cancer.  06/14/2016 thyroglobulin antibodies negative, thyroglobulin by IME less than 0.1  06/27/2017 - His previsit ultrasound at an outside facility in [facility] revealed an echogenic focus measuring 1.7cm x 0.7cm x 1.1 cm ( previously 1 cm-1.2 cm in [facility] imaging on 02/01/2016) potentially representing a small amount of residual thyroid tissue versus small lymph node. There is no identifiable tissue on the left, no identifiable isthmus.  August 08, 2018 thyroid/neck ultrasound in [facility]:   FINDINGS:  Residual solid nodular isoechoic tissue in the right  thyroid noted measures 1.2 x 1.0 x 1.0 cm, previously 1.0 x 0.7 x 0.8 cm. No significant interval change. No new abnormality.  Similar ultrasound findings on February 02, 2018. See above.  September 07, 2018 thyroid stimulated whole-body scan negative for iodine avid distant metastasis.  Thyroglobulin by IMA on October 08, 2018 undetectable.  Recent Results (from the past 2160 hour(s))  Basic metabolic panel     Status: None   Collection Time: 07/23/19 12:00 AM  Result Value Ref Range   BUN 20 4 - 21   Creatinine 1.2 0.6 - 1.3  Lipid panel     Status: Abnormal   Collection Time: 07/23/19 12:00 AM  Result Value Ref Range   Triglycerides 170 (A) 40 - 160   Cholesterol 211 (A) 0 - 200   HDL 44 35 - 70   LDL Cholesterol 136   Hemoglobin A1c     Status: None   Collection Time: 07/23/19 12:00 AM  Result Value Ref Range   Hemoglobin A1C 9.1   TSH     Status: Abnormal   Collection Time: 07/23/19 12:00 AM  Result Value Ref Range   TSH 12.80 (A) 0.41 - 5.90    Comment: free t4 1.43      Assessment & Plan:   1.  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 on July 17, 2012, thyrogen stimulated RAI thyroid remnant ablation given his BRAF positive presentation.  The post therapy scan was negative for distant metastasis. His May 2014 thyroid ultrasound is negative for remnant tissue.  Subsequent surveillance thyrogen stimulated WBS on 10/25/13 was c/w successful ablation of thyroid remnants, with no evidence of local recurrence nor metastatic disease.  His repeat thyroid ultrasound on 10/11/14 showed no residual thyroid tissue. However, on his last surveillance thyroid/neck ultrasound she was found to have a new 19m residual/recurrent soft tissue nodule in the right  thyroidectomy bed. On subsequent surveillance thyroid ultrasound this tissue was found to be larger at 12  mm. Attempt to biopsy this up to [facility] was not diagnostic.   - On thyroid/neck ultrasound before this visit on 02/01/2016 showed that the remnant thyroid tissue decreased in size to 1.0 cm, all other  findings unchanged.  There was no sign of residual thyroid tissue on whole-body scan performed 2015.  She is thyroglobulin by IMA was less than 0.1 associated with negative thyroglobulin antibodies on 06/14/2016.  - Thyrogen stimulated whole-body scan on 06/21/2016 was negative for iodine avid metastatic thyroid cancer.   - Most recent thyroid/neck ultrasound on 06/27/2017 showed persistence of possibly thyroid tissue in the right thyroid lobe bed- this was done within a different facility than his prior imaging studies-subject to variations of measurements.   -His previsit thyroid/neck ultrasound was done on August 08, 2017 prematurely due to his insistence. -The report was that there was no major interval change in the thyroid remnant on the right thyroid bed.  - [facility] shows stable finding of 1.2 cm nodular area within the right lobectomy bed. No intervention at this time. His recent whole-body scan after Thyrogen stimulation was negative for distant metastasis on September 07, 2018.   -Given the fact that patient did not keep his appointment since March 2020, he is approached for  surveillance thyroid/neck ultrasound before his next visit in 3 months.   2. Postsurgical hypothyroidism -His previsit labs are consistent with inadequate replacement.  I discussed and increase his Synthroid to 175 mcg p.o. daily before breakfast.     - We discussed about the correct intake of his thyroid hormone, on empty stomach at fasting, with water, separated by at least 30 minutes from breakfast and other medications,  and separated by more than 4 hours from calcium, iron, multivitamins, acid reflux medications (PPIs). -Patient is made aware  of the fact that thyroid hormone replacement is needed for life, dose to be adjusted by periodic monitoring of thyroid function tests. .   3. Uncontrolled type 2 diabetes mellitus with complication, with long-term current use of insulin ([place]) - He has a history of noncompliance.  His recent A1c is higher at 9.1%, increasing from 8.8%.    -We will continue to require intensive treatment with basal/bolus insulin in order for him to achieve and maintain control of diabetes to target.    -He is encouraged to continue to use his freestyle libre CGM device at all times. -He is advised to continue Levemir 80 units nightly, increase Humalog to 18  units  3 times daily before meals for pre-meal blood glucose readings above 90 mg/dL, plus patient specific correction for readings above 150 mg/dL.    - He is urged to wear his  CGM device at all times and to continue documenting his blood glucose at least 4 times a day-before meals and at bedtime.    -He is assured that he is still benefiting from metformin, advised to continue  metformin 1000 mg p.o. twice daily after breakfast and supper.   4. Hyperlipidemia: Uncontrolled with a recent LDL of 136 increasing from 126. He is advised to continue  Crestor to 10 mg p.o. nightly.   5. Essential hypertension: he is advised to home monitor blood pressure and report if > 140/90 on 2 separate readings.  He is advised to continue on his current medications including lisinopril/HCTZ 20/12.5 mg p.o. daily at breakfast.    I advised patient to maintain close follow up with his PCP for primary care needs.  - Time spent on this patient care encounter:  45 min, of which > 50% was spent in  counseling the patient on the importance of strict follow-up given his history of thyroid cancer, uncontrolled diabetes, postsurgical hypothyroidism, hyperlipidemia, hypertension and the rest reviewing his blood glucose logs , discussing his hypoglycemia and hyperglycemia episodes,  reviewing his current and  previous labs / studies  ( including abstraction from other facilities) and medications  doses and developing a  long term treatment plan and documenting his care.   Please refer to Patient Instructions for Blood Glucose Monitoring and Insulin/Medications Dosing Guide"  in media tab for additional information. Please  also refer to " Patient Self Inventory" in the Media  tab for reviewed elements of pertinent patient history.  Dean Hurley participated in the discussions, expressed understanding, and voiced agreement with the above plans.  All questions were answered to his satisfaction. he is encouraged to contact clinic should he have any questions or concerns prior to his return visit.   Follow up plan: Return in about 3 months (around 11/09/2019) for Thyroid / Neck Ultrasound, Bring Meter and Logs- A1c in Office, Include 8 log sheets.  Glade Lloyd, MD Phone: [phone]  Fax: [phone]   This note was partially dictated with voice recognition software. Similar sounding words  can be transcribed inadequately or may not  be corrected upon review.  08/11/2019, 6:09 PM

## 2019-08-20 ENCOUNTER — Other Ambulatory Visit: Payer: Self-pay

## 2019-08-20 ENCOUNTER — Ambulatory Visit (HOSPITAL_COMMUNITY)
Admission: RE | Admit: 2019-08-20 | Discharge: 2019-08-20 | Disposition: A | Discharge status: Home / Self Care | Payer: BLUE CROSS/BLUE SHIELD | Source: Ambulatory Visit | Attending: "Endocrinology | Admitting: "Endocrinology

## 2019-08-20 DIAGNOSIS — C73 Malignant neoplasm of thyroid gland: Secondary | ICD-10-CM | POA: Diagnosis not present

## 2019-09-10 ENCOUNTER — Other Ambulatory Visit: Payer: Self-pay | Admitting: "Endocrinology

## 2019-09-10 ENCOUNTER — Other Ambulatory Visit: Payer: Self-pay

## 2019-09-10 MED ORDER — LEVOTHYROXINE SODIUM 175 MCG PO CAPS: 175.0000 ug | ORAL_CAPSULE | Freq: Every day | ORAL | 1 refills | Status: DC

## 2019-09-14 ENCOUNTER — Other Ambulatory Visit: Payer: Self-pay | Admitting: "Endocrinology

## 2019-09-19 ENCOUNTER — Other Ambulatory Visit: Payer: Self-pay | Admitting: "Endocrinology

## 2019-10-04 ENCOUNTER — Telehealth: Payer: Self-pay | Admitting: "Endocrinology

## 2019-10-04 ENCOUNTER — Other Ambulatory Visit: Payer: Self-pay | Admitting: "Endocrinology

## 2019-10-04 MED ORDER — NOVOLOG FLEXPEN 100 UNIT/ML ~~LOC~~ SOPN: 18.0000 [IU] | PEN_INJECTOR | Freq: Three times a day (TID) | SUBCUTANEOUS | 2 refills | Status: DC

## 2019-10-04 NOTE — Telephone Encounter (Signed)
I will send a prescription for him to try NovoLog, same dose, same program.

## 2019-10-04 NOTE — Telephone Encounter (Signed)
Pt will be on Medicare as of Friday, medicare will not cover his Humalog. What do you suggest?

## 2019-10-04 NOTE — Telephone Encounter (Signed)
Left a message making pt aware. 

## 2019-11-10 ENCOUNTER — Ambulatory Visit: Payer: BLUE CROSS/BLUE SHIELD | Admitting: "Endocrinology

## 2019-11-15 ENCOUNTER — Other Ambulatory Visit: Payer: Self-pay | Admitting: "Endocrinology

## 2019-11-17 ENCOUNTER — Encounter: Payer: Self-pay | Admitting: "Endocrinology

## 2019-11-17 ENCOUNTER — Other Ambulatory Visit: Payer: Self-pay

## 2019-11-17 ENCOUNTER — Ambulatory Visit (INDEPENDENT_AMBULATORY_CARE_PROVIDER_SITE_OTHER): Payer: Medicare Other | Admitting: "Endocrinology

## 2019-11-17 VITALS — BP 135/83 | HR 81 | Ht 68.0 in | Wt 205.6 lb

## 2019-11-17 DIAGNOSIS — I10 Essential (primary) hypertension: Secondary | ICD-10-CM | POA: Diagnosis not present

## 2019-11-17 DIAGNOSIS — E890 Postprocedural hypothyroidism: Secondary | ICD-10-CM

## 2019-11-17 DIAGNOSIS — E782 Mixed hyperlipidemia: Secondary | ICD-10-CM

## 2019-11-17 DIAGNOSIS — C73 Malignant neoplasm of thyroid gland: Secondary | ICD-10-CM | POA: Diagnosis not present

## 2019-11-17 DIAGNOSIS — Z794 Long term (current) use of insulin: Secondary | ICD-10-CM

## 2019-11-17 DIAGNOSIS — IMO0002 Reserved for concepts with insufficient information to code with codable children: Secondary | ICD-10-CM

## 2019-11-17 DIAGNOSIS — E1165 Type 2 diabetes mellitus with hyperglycemia: Secondary | ICD-10-CM

## 2019-11-17 DIAGNOSIS — Z87891 Personal history of nicotine dependence: Secondary | ICD-10-CM

## 2019-11-17 DIAGNOSIS — E118 Type 2 diabetes mellitus with unspecified complications: Secondary | ICD-10-CM | POA: Diagnosis not present

## 2019-11-17 LAB — HEMOGLOBIN A1C
Hemoglobin A1C: 9.2
Hemoglobin A1C: 9.2

## 2019-11-17 MED ORDER — GLIPIZIDE ER 5 MG PO TB24: 5.0000 mg | ORAL_TABLET | Freq: Every day | ORAL | 3 refills | Status: DC

## 2019-11-17 MED ORDER — LEVEMIR FLEXTOUCH 100 UNIT/ML ~~LOC~~ SOPN: 50.0000 [IU] | PEN_INJECTOR | Freq: Two times a day (BID) | SUBCUTANEOUS | 2 refills | Status: DC

## 2019-11-17 MED ORDER — REPATHA SURECLICK 140 MG/ML ~~LOC~~ SOAJ: 140.0000 mg | SUBCUTANEOUS | 3 refills | Status: DC

## 2019-11-17 NOTE — Progress Notes (Signed)
11/17/2019  Endocrinology follow-up note   Subjective:    Patient ID: Dean Hurley, male    DOB: 1950-10-16,   Chief complaint  Follow-up for 1)  History of thyroid malignancy 2) postsurgical hypothyroidism 3)  Uncontrolled type 2 diabetes  4) hyperlipidemia 5) hypertension   Past Medical History:  Diagnosis Date  . Cancer ([place])   . Diabetes mellitus without complication ([place])   . Hypertension   . Thyroid cancer [facility])    Past Surgical History:  Procedure Laterality Date  . BACK SURGERY    . THYROID SURGERY     Thyroidectomy  . TRANSURETHRAL RESECTION OF PROSTATE     Social History   Socioeconomic History  . Marital status: Married    Spouse name: Not on file  . Number of children: Not on file  . Years of education: Not on file  . Highest education level: Not on file  Occupational History  . Not on file  Tobacco Use  . Smoking status: Former Smoker    Types: Cigarettes  . Smokeless tobacco: Never Used  Substance and Sexual Activity  . Alcohol use: No    Alcohol/week: 0.0 standard drinks  . Drug use: No  . Sexual activity: Not on file  Other Topics Concern  . Not on file  Social History Narrative  . Not on file   Social Determinants of Health   Financial Resource Strain:   . Difficulty of Paying Living Expenses:   Food Insecurity:   . Worried About Charity fundraiser in the Last Year:   . Arboriculturist in the Last Year:   Transportation Needs:   . Film/video editor (Medical):   Marland Kitchen Lack of Transportation (Non-Medical):   Physical Activity:   . Days of Exercise per Week:   . Minutes of Exercise per Session:   Stress:   . Feeling of Stress :   Social Connections:   . Frequency of Communication with Friends and Family:   . Frequency of Social Gatherings with Friends and Family:   . Attends Religious Services:   . Active Member of Clubs or Organizations:   . Attends Archivist Meetings:   Marland Kitchen Marital Status:     Outpatient Encounter Medications as of 11/17/2019  Medication Sig  . alum & mag hydroxide-simeth (MAALOX/MYLANTA) 200-200-20 MG/5ML suspension Take 20 mLs by mouth every 6 (six) hours as needed for heartburn.  . Continuous Blood Gluc Sensor (FREESTYLE LIBRE 14 DAY SENSOR) MISC USE AS DIRECTED FOR 14 DAYS  . Evolocumab (REPATHA SURECLICK) 471 MG/ML SOAJ Inject 140 mg into the skin every 14 (fourteen) days.  Marland Kitchen glipiZIDE (GLUCOTROL XL) 5 MG 24 hr tablet Take 1 tablet (5 mg total) by mouth daily with breakfast.  . insulin aspart (NOVOLOG FLEXPEN) 100 UNIT/ML FlexPen Inject 18-24 Units into the skin 3 (three) times daily with meals.  . insulin detemir (LEVEMIR FLEXTOUCH) 100 UNIT/ML FlexPen Inject 50 Units into the skin 2 (two) times daily.  . Insulin Pen Needle (PEN NEEDLES) 31G X 6 MM MISC 1 each by Does not apply route 4 (four) times daily.  . Levothyroxine Sodium 175 MCG CAPS Take 175 mcg by mouth daily before breakfast.  . lisinopril-hydrochlorothiazide (ZESTORETIC) 20-12.5 MG tablet TAKE 1 TABLET BY MOUTH EVERY DAY  . meclizine (ANTIVERT) 25 MG tablet Take 25 mg by mouth 3 (three) times daily as needed for dizziness.  Marland Kitchen  meloxicam (MOBIC) 15 MG tablet Take 15 mg by mouth daily.  . metFORMIN (GLUCOPHAGE) 1000 MG tablet TAKE 1 TABLET BY MOUTH TWICE A DAY WITH MEALS  . triamcinolone cream (KENALOG) 0.1 % Apply 1 application topically daily as needed (for scaly skin).   . [DISCONTINUED] Evolocumab (REPATHA SURECLICK) 322 MG/ML SOAJ Inject 140 mg into the skin every 14 (fourteen) days.  . [DISCONTINUED] LEVEMIR FLEXTOUCH 100 UNIT/ML FlexPen INJECT 80 UNITS INTO THE SKIN AT BEDTIME.  . [DISCONTINUED] rosuvastatin (CRESTOR) 10 MG tablet TAKE 1 TABLET BY MOUTH EVERYDAY AT BEDTIME   No facility-administered encounter medications on file as of 11/17/2019.   ALLERGIES: Allergies  Allergen Reactions  . Codeine   . Contrast Media [Iodinated Diagnostic Agents]    VACCINATION STATUS:  There is no  immunization history on file for this patient.  Diabetes    69 yr old male who underwent total thyroidectomy in [facility] on June 02, 2012.  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 1.7 cm cancer nodule on the right lobe and 0.4 cm cancer nodule on the left lobe. Partial invasion of capsule, margins negative. He underwent thyrogen stimulated I131  thyroid remnant ablation with negative whole body scan for distant metastasis on July 17, 2013.    May 2014 thyroid /neck ultrasound :  negative for any mass lesion.   Thyrogen stimulated WBS on 10/25/13 was c/w successful ablation of thyroid remnants with no evidence of local recurrence or metastatic disease. He has  had another thyroid ultrasound on 10/11/14 which was negative for residual thyroid tissue. On his next  surveillance thyroid ultrasound on September 6,2016 however he has had a 9 mm residual tissue on the  right thyroid bed, which grew to 12 mm in follow-up ultrasound in December 2016. He was sent to [facility] where he had his prior surgery. Fine needle  aspiration of the residual tissue did not reveal diagnostic material.  - On thyroid/neck ultrasound  on 02/01/2016 showed that the remnant thyroid tissue decreased in size to 1.0 cm, all other  findings unchanged.  - His last  Thyrogen stimulated whole-body scan on  06/21/2016 was  reported to be negative for any iodine avid metastatic thyroid cancer.  - On 06/27/2017 : His previsit ultrasound at an outside facility in [facility] revealed an echogenic focus measuring 1.7cm x 0.7cm x 1.1 cm ( previously 1 cm-1.2 cm in [facility] imaging on 02/01/2016) potentially representing a small amount of residual thyroid tissue versus small lymph node. There is no identifiable tissue on the left, no identifiable isthmus.  -He pushed to get his previsit ultrasound prematurely on August 08, 2017 which showed no significant  change in the thyroid remnant at 1.2 cm.  -His most recent  repeat surveillance thyroid/neck ultrasound on February 02, 2018 is unchanged showing stable 1.2 cm nodular tissue ithin the right lobectomy bed, unchanged since December 2016.   September 07, 2018: Thyrogen stimulated whole-body scan negative for any iodine avid metastatic thyroid cancer. -See his previous visit notes. -He was sent for surveillance thyroid/neck ultrasound on August 20, 2019 which revealed stable 1.2 cm thyroid remnant tissue on the right thyroid bed.  No additional or new findings.    He remains on Synthroid 175 mcg p.o. daily before breakfast.  He reports compliance to this medication.  For his uncontrolled type 2 diabetes, historically not very compliant to his diagnosis.  He was given that freestyle libre CGM device during his  last visit.  His point-of-care A1c today is 9.2% unchanged from his last visit.    He was supposed to be on Levemir 80 units nightly, Humalog 18 units 3 times daily AC, as well as Metformin 1000 mg p.o. twice daily. He does not have his logs with him today, reports off of his CGM device averaging between 195-210 for the last 30 days.  No hypoglycemia reported nor documented .  Review of Systems Limited as above.   Objective:    BP 135/83   Pulse 81   Ht _0  (1.727 m)   Wt 205 lb 9.6 oz (93.3 kg)   BMI 31.26 kg/m   Wt Readings from Last 3 Encounters:  11/17/19 205 lb 9.6 oz (93.3 kg)  10/13/18 212 lb 3.2 oz (96.3 kg)  06/11/18 206 lb (93.4 kg)    Physical Exam    Physical Exam- Limited  Constitutional:  Body mass index is 31.26 kg/m. , not in acute distress, normal state of mind Eyes:  EOMI, no exophthalmos Neck: Supple Thyroid: No gross goiter Respiratory: Adequate breathing efforts Musculoskeletal: no gross deformities, strength intact in all four extremities, no gross restriction of joint movements Skin:  no rashes, no hyperemia Neurological: no tremor with outstretched  hands,    CMP Latest Ref Rng & Units 07/23/2019 10/08/2018 05/17/2018  Glucose 65 - 99 mg/dL - 132(H) 243(H)  BUN 4 - _1 Creatinine 0.6 - 1.3 1.2 1.13 1.06  Sodium 134 - 144 mmol/L - 140 138  Potassium 3.5 - 5.2 mmol/L - 4.6 4.0  Chloride 96 - 106 mmol/L - 100 106  CO2 20 - 29 mmol/L - 22 23  Calcium 8.6 - 10.2 mg/dL - 9.2 8.8(L)  Total Protein 6.0 - 8.5 g/dL - 7.0 -  Total Bilirubin 0.0 - 1.2 mg/dL - 0.4 -  Alkaline Phos 39 - 117 IU/L - 73 -  AST 0 - 40 IU/L - 18 -  ALT 0 - 44 IU/L - 38 -   A1c today is 9.2%.  Results for JAMELL, LAYMON (MRN [id number]) as of 11/17/2019 21:29  Ref. Range 10/08/2018 09:46 07/23/2019 00:00 11/17/2019 00:00  Glucose Latest Ref Range: 65 - 99 mg/dL 132 (H)    Hemoglobin A1C Unknown 8.8 (H) 9.1 9.2  TSH Latest Ref Range: 0.41 - 5.90  3.470 12.80 (A)   T4,Free(Direct) Latest Ref Range: 0.82 - 1.77 ng/dL 1.47    Thyroglobulin Antibody Latest Ref Range: 0.0 - 0.9 IU/mL <1.0    Thyroglobulin by IMA Latest Ref Range: 1.4 - 29.2 ng/mL <0.1 (L)       Thyrogen stimulated whole-body scan on 06/21/2016: IMPRESSION: Negative whole-body I 131 scan for recurrent iodine avid thyroid Cancer.  06/14/2016 thyroglobulin antibodies negative, thyroglobulin by IME less than 0.1  06/27/2017 - His previsit ultrasound at an outside facility in [facility] revealed an echogenic focus measuring 1.7cm x 0.7cm x 1.1 cm ( previously 1 cm-1.2 cm in [facility] imaging on 02/01/2016) potentially representing a small amount of residual thyroid tissue versus small lymph node. There is no identifiable tissue on the left, no identifiable isthmus.  August 08, 2018 thyroid/neck ultrasound in [facility]:   FINDINGS: Residual solid nodular isoechoic tissue in the right thyroid noted measures 1.2 x 1.0 x 1.0 cm, previously 1.0 x 0.7 x 0.8 cm. No significant interval change. No new abnormality.  Similar ultrasound findings on February 02, 2018. See  above.  September 07, 2018  thyroid stimulated whole-body scan negative for iodine avid distant metastasis.  Thyroglobulin by IMA on October 08, 2018 undetectable.  Thyroid/neck surveillance ultrasound August 20, 2019 IMPRESSION: Stable soft tissue in the right thyroidectomy bed. No significant change from the previous examination. No other evidence for recurrent or residual thyroid tissue.  Assessment & Plan:   1.  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 on July 17, 2012, thyrogen stimulated RAI thyroid remnant ablation given his BRAF positive presentation.  The post therapy scan was negative for distant metastasis. His May 2014 thyroid ultrasound is negative for remnant tissue.  Subsequent surveillance thyrogen stimulated WBS on 10/25/13 was c/w successful ablation of thyroid remnants, with no evidence of local recurrence nor metastatic disease.  His repeat thyroid ultrasound on 10/11/14 showed no residual thyroid tissue. However, on his last surveillance thyroid/neck ultrasound she was found to have a new 34m residual/recurrent soft tissue nodule in the right  thyroidectomy bed. On subsequent surveillance thyroid ultrasound this tissue was found to be larger at 12 mm. Attempt to biopsy this up to [facility] was not diagnostic.   - On thyroid/neck ultrasound before this visit on 02/01/2016 showed that the remnant thyroid tissue decreased in size to 1.0 cm, all other  findings unchanged.  There was no sign of residual thyroid tissue on whole-body scan performed 2015.  She is thyroglobulin by IMA was less than 0.1 associated with negative thyroglobulin antibodies on 06/14/2016.  - Thyrogen stimulated whole-body scan on 06/21/2016 was negative for iodine avid metastatic thyroid cancer.   - Most recent thyroid/neck ultrasound on 06/27/2017 showed persistence of possibly  thyroid tissue in the right thyroid lobe bed- this was done within a different facility than his prior imaging studies-subject to variations of measurements.   -His previsit thyroid/neck ultrasound was done on August 08, 2017 prematurely due to his insistence. -The report was that there was no major interval change in the thyroid remnant on the right thyroid bed.  - [facility] shows stable finding of 1.2 cm nodular area within the right lobectomy bed. No intervention at this time. His recent whole-body scan after Thyrogen stimulation was negative for distant metastasis on September 07, 2018.    Surveillance thyroid/neck ultrasound on January 2021 Stable soft tissue in the right thyroidectomy bed. No significant change from the previous examination. No other evidence for recurrent or residual thyroid tissue.   2. Postsurgical hypothyroidism -His previsit labs are consistent with inadequate replacement, however, he has consistency problem with his medications.  His Synthroid will not be increased any further.  He is advised to continue Synthroid 175 mcg p.o. daily before breakfast.     - We discussed about the correct intake of his thyroid hormone, on empty stomach at fasting, with water, separated by at least 30 minutes from breakfast and other medications,  and separated by more than 4 hours from calcium, iron, multivitamins, acid reflux medications (PPIs). -Patient is made aware of the fact that thyroid hormone replacement is needed for life, dose to be adjusted by periodic monitoring of thyroid function tests.   3. Uncontrolled type 2 diabetes mellitus with complication, with long-term current use of insulin ([place] - He has a history of noncompliance.  His recent A1c is higher at 9.2%, unchanged from his last visit A1c of 9.1%.     -  He will continue to require intensive treatment with basal/bolus insulin in order for him to  achieve and maintain control of diabetes to target.       -He is encouraged to continue to use his freestyle libre CGM device at all times. -He is advised to increase Levemir to 50 units twice daily, continue Humalog 18  units  3 times daily before meals for pre-meal blood glucose readings above 90 mg/dL, plus patient specific correction for readings above 150 mg/dL.    - He is urged to wear his  CGM device at all times and to continue documenting his blood glucose at least 4 times a day-before meals and at bedtime.    -He is assured that he is still benefiting from metformin, advised to continue  metformin 1000 mg p.o. twice daily after breakfast and supper.  -He may benefit from low-dose glipizide.  I discussed and added glipizide 5 mg XL p.o. daily at breakfast.  4. Hyperlipidemia: Uncontrolled with a recent LDL of 136 increasing from 126. He did not tolerate Crestor which he stopped.  I discussed and prescribed Repatha during his last visit, did not fill this prescription.  He is being considered for Repatha again 140 mg subcutaneously every 14 days.     5. Essential hypertension: His blood pressure is controlled to target.  He is advised to continue on his current medications including lisinopril/HCTZ 20/12.5 mg p.o. daily at breakfast.    I advised patient to maintain close follow up with his PCP for primary care needs.  - Time spent on this patient care encounter:  45 min, of which > 50% was spent in  counseling and the rest reviewing his blood glucose logs , discussing his hypoglycemia and hyperglycemia episodes, reviewing his current and  previous labs / studies  ( including abstraction from other facilities) and medications  doses and developing a  long term treatment plan and documenting his care.   Please refer to Patient Instructions for Blood Glucose Monitoring and Insulin/Medications Dosing Guide"  in media tab for additional information. Please  also refer to " Patient Self  Inventory" in the Media  tab for reviewed elements of pertinent patient history.  Stevie Kern participated in the discussions, expressed understanding, and voiced agreement with the above plans.  All questions were answered to his satisfaction. he is encouraged to contact clinic should he have any questions or concerns prior to his return visit.   Follow up plan: Return in about 4 months (around 03/18/2020) for Bring Meter and Logs- A1c in Office, Follow up with Pre-visit Labs.  Glade Lloyd, MD Phone: [phone]  Fax: [phone]   This note was partially dictated with voice recognition software. Similar sounding words can be transcribed inadequately or may not  be corrected upon review.  11/17/2019, 9:23 PM

## 2019-11-17 NOTE — Patient Instructions (Signed)

## 2020-01-21 ENCOUNTER — Other Ambulatory Visit: Payer: Self-pay | Admitting: "Endocrinology

## 2020-01-21 DIAGNOSIS — IMO0002 Reserved for concepts with insufficient information to code with codable children: Secondary | ICD-10-CM

## 2020-01-21 DIAGNOSIS — E1165 Type 2 diabetes mellitus with hyperglycemia: Secondary | ICD-10-CM

## 2020-01-25 ENCOUNTER — Other Ambulatory Visit: Payer: Self-pay

## 2020-01-25 MED ORDER — METFORMIN HCL 1000 MG PO TABS: 1000.0000 mg | ORAL_TABLET | Freq: Two times a day (BID) | ORAL | 0 refills | Status: DC

## 2020-02-12 ENCOUNTER — Other Ambulatory Visit: Payer: Self-pay | Admitting: "Endocrinology

## 2020-02-21 ENCOUNTER — Other Ambulatory Visit: Payer: Self-pay

## 2020-02-21 MED ORDER — FREESTYLE LIBRE 14 DAY SENSOR MISC: 8 refills | Status: DC

## 2020-03-22 ENCOUNTER — Ambulatory Visit: Payer: Medicare Other | Admitting: "Endocrinology

## 2020-03-23 ENCOUNTER — Other Ambulatory Visit: Payer: Self-pay

## 2020-03-23 DIAGNOSIS — IMO0002 Reserved for concepts with insufficient information to code with codable children: Secondary | ICD-10-CM

## 2020-03-23 DIAGNOSIS — E1165 Type 2 diabetes mellitus with hyperglycemia: Secondary | ICD-10-CM

## 2020-03-24 LAB — COMPREHENSIVE METABOLIC PANEL
ALT: 24 IU/L (ref 0–44)
AST: 13 IU/L (ref 0–40)
Albumin/Globulin Ratio: 1.5 (ref 1.2–2.2)
Albumin: 4.4 g/dL (ref 3.8–4.8)
Alkaline Phosphatase: 90 IU/L (ref 48–121)
BUN/Creatinine Ratio: 12 (ref 10–24)
BUN: 16 mg/dL (ref 8–27)
Bilirubin Total: 0.3 mg/dL (ref 0.0–1.2)
CO2: 23 mmol/L (ref 20–29)
Calcium: 9.8 mg/dL (ref 8.6–10.2)
Chloride: 102 mmol/L (ref 96–106)
Creatinine, Ser: 1.35 mg/dL — ABNORMAL HIGH (ref 0.76–1.27)
GFR calc Af Amer: 62 mL/min/{1.73_m2} (ref >59)
GFR calc non Af Amer: 54 mL/min/{1.73_m2} — ABNORMAL LOW (ref >59)
Globulin, Total: 3 g/dL (ref 1.5–4.5)
Glucose: 204 mg/dL — ABNORMAL HIGH (ref 65–99)
Potassium: 4.9 mmol/L (ref 3.5–5.2)
Sodium: 140 mmol/L (ref 134–144)
Total Protein: 7.4 g/dL (ref 6.0–8.5)

## 2020-03-24 LAB — TGAB+THYROGLOBULIN IMA OR LCMS: Thyroglobulin Antibody: <1 IU/mL (ref 0.0–0.9)

## 2020-03-24 LAB — THYROGLOBULIN BY IMA: Thyroglobulin by IMA: <0.1 ng/mL — ABNORMAL LOW (ref 1.4–29.2)

## 2020-03-24 LAB — T4, FREE: Free T4: 1.49 ng/dL (ref 0.82–1.77)

## 2020-03-24 LAB — LIPID PANEL W/O CHOL/HDL RATIO
Cholesterol, Total: 151 mg/dL (ref 100–199)
HDL: 37 mg/dL — ABNORMAL LOW (ref >39)
LDL Chol Calc (NIH): 87 mg/dL (ref 0–99)
Triglycerides: 156 mg/dL — ABNORMAL HIGH (ref 0–149)
VLDL Cholesterol Cal: 27 mg/dL (ref 5–40)

## 2020-03-24 LAB — TSH: TSH: 0.821 u[IU]/mL (ref 0.450–4.500)

## 2020-03-28 ENCOUNTER — Ambulatory Visit: Payer: Medicare Other | Admitting: "Endocrinology

## 2020-04-29 ENCOUNTER — Other Ambulatory Visit: Payer: Self-pay | Admitting: "Endocrinology

## 2020-05-04 ENCOUNTER — Other Ambulatory Visit: Payer: Self-pay | Admitting: "Endocrinology

## 2020-07-15 IMAGING — US US SOFT TISSUE HEAD/NECK
1 series · 14 of 25 positions shown · non-contrast
Comparison: 12/29/2017

CLINICAL DATA: History of thyroidectomy for thyroid cancer. History
of nodular tissue within the right thyroidectomy bed.

EXAM:
THYROID ULTRASOUND
TECHNIQUE: Ultrasound examination of the thyroid gland and adjacent soft
tissues was performed.

[Series 1: us soft tissue head/neck · 0.06mm/px · 14 of 51 slices shown]
[im 1/51]
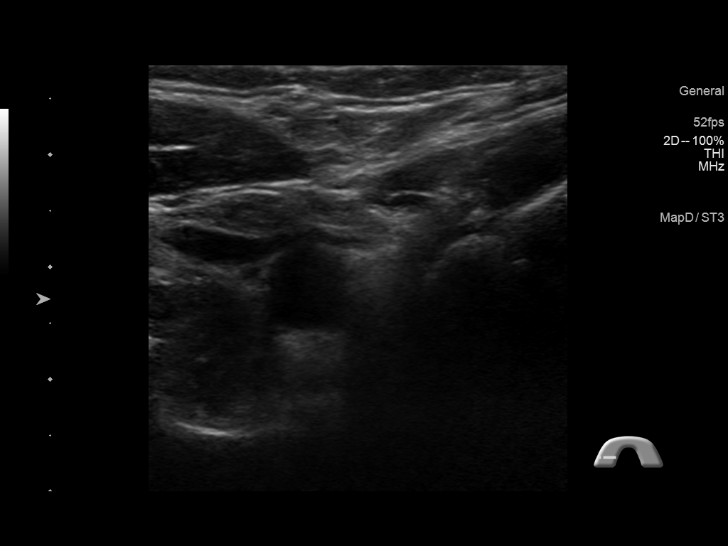
[im 5/51]
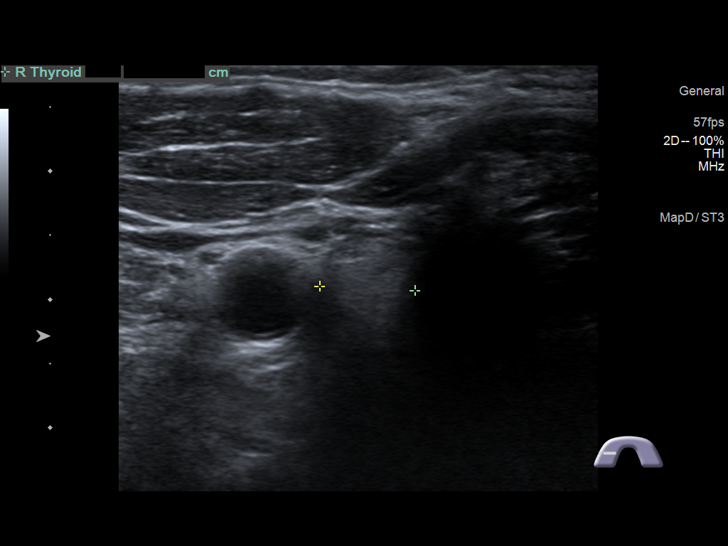
[im 9/51]
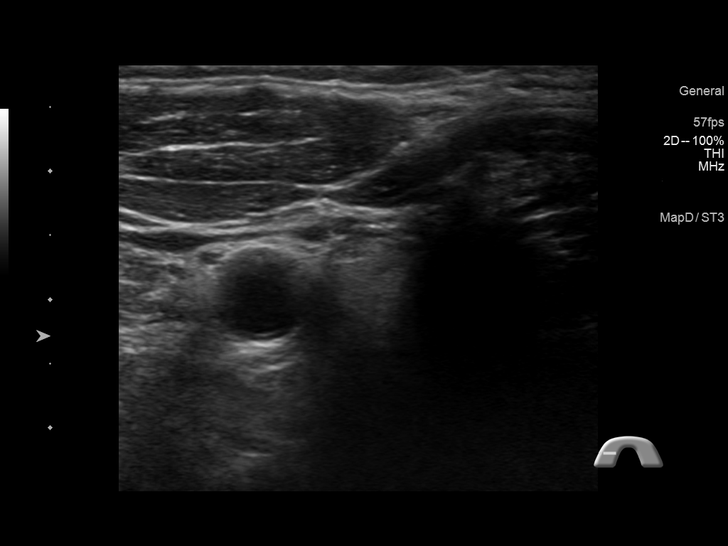
[im 13/51]
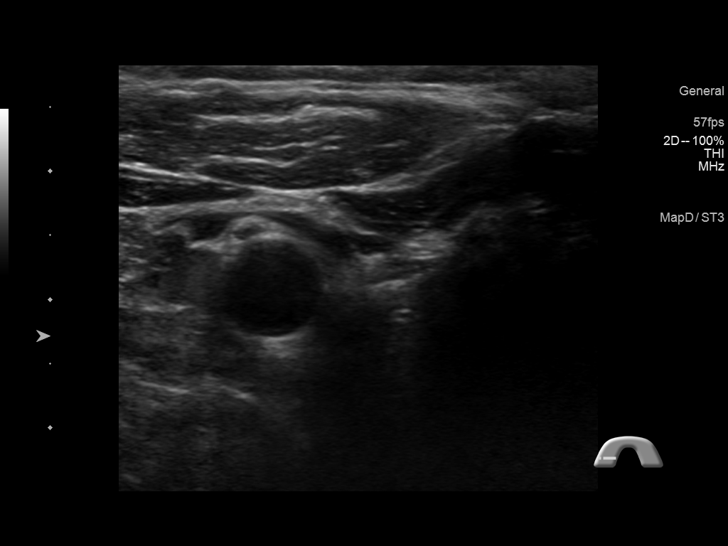
[im 17/51]
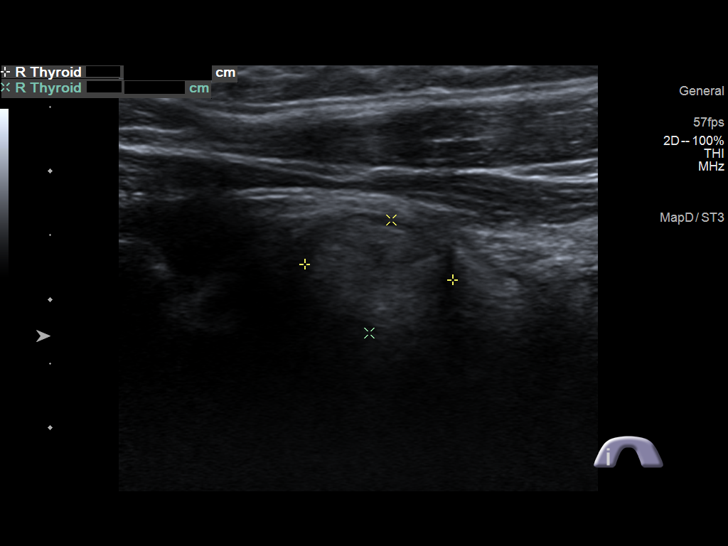
[im 19/51]
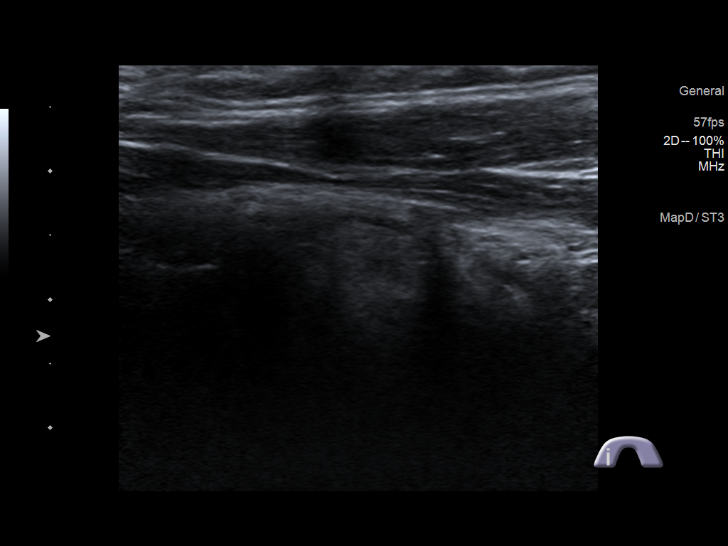
[im 23/51]
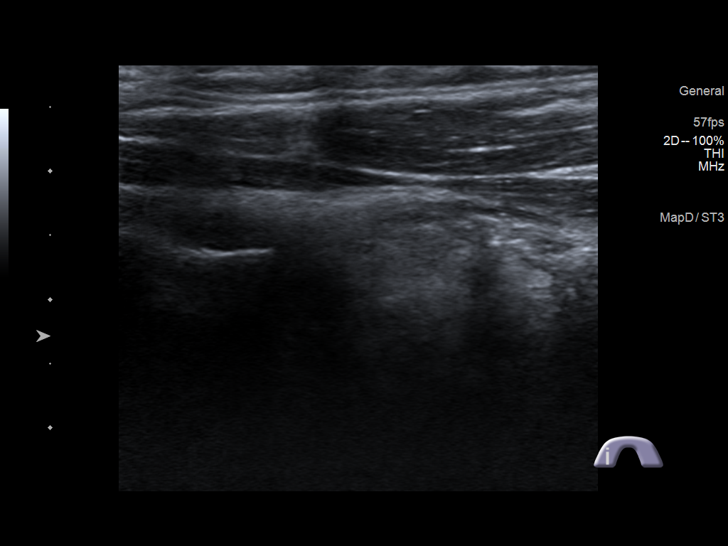
[im 28/51]
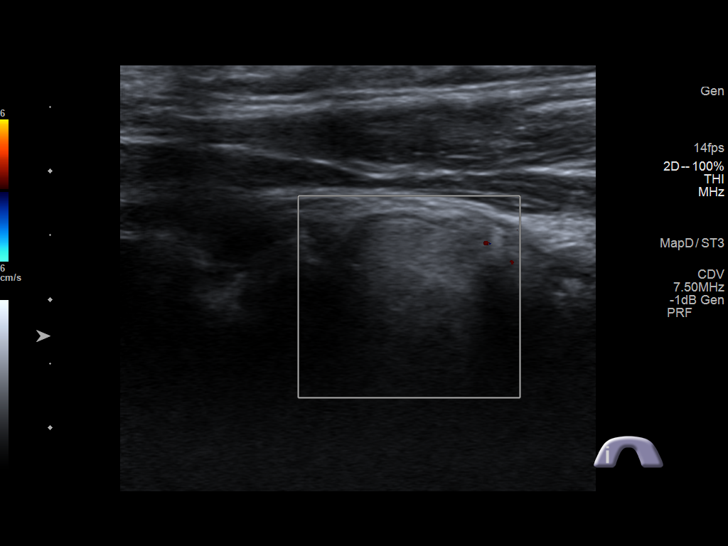
[im 32/51]
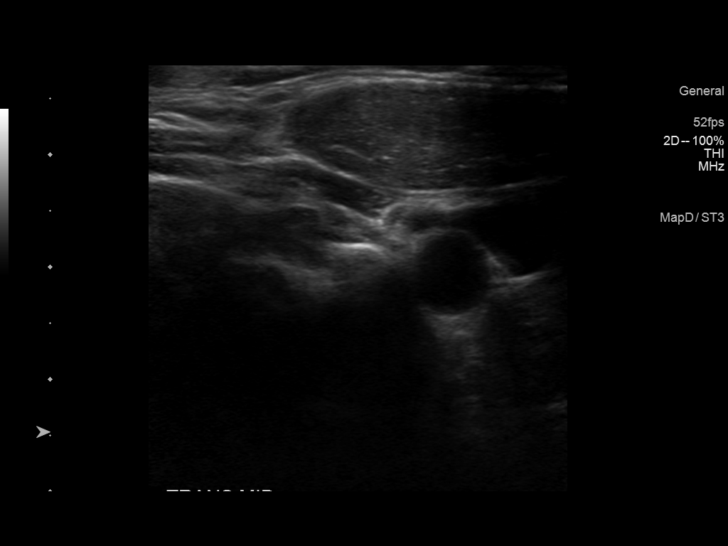
[im 34/51]
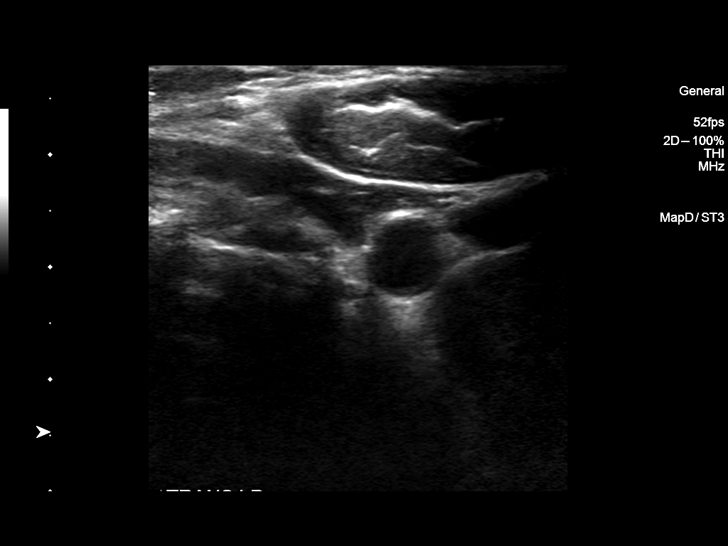
[im 38/51]
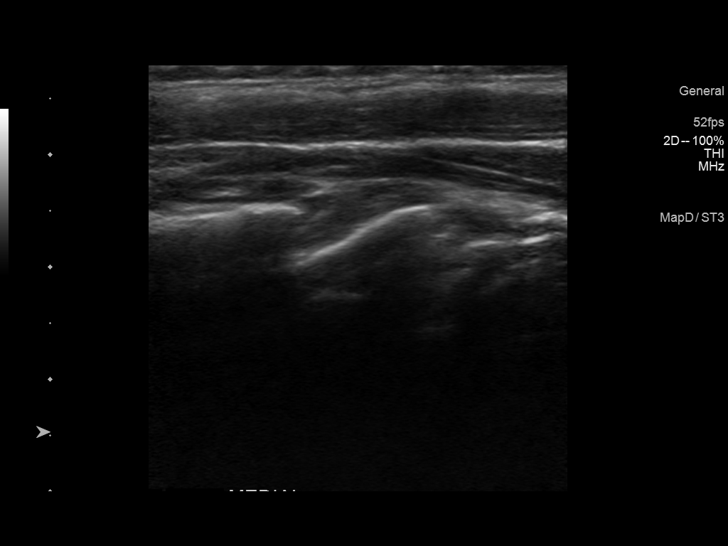
[im 42/51]
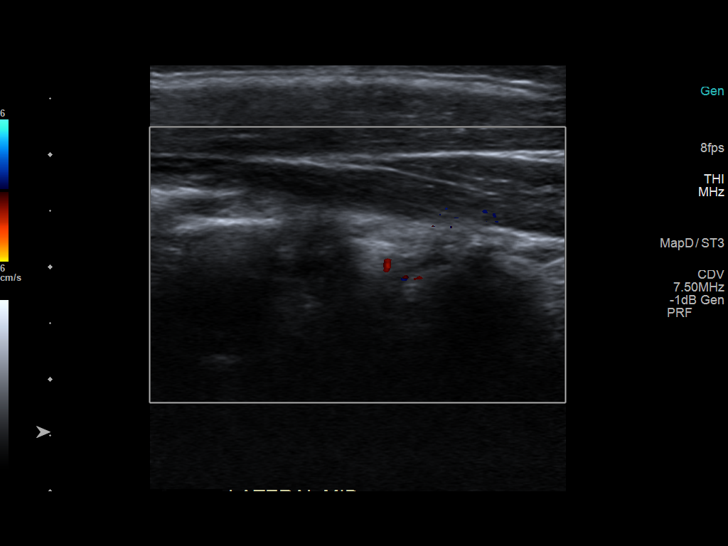
[im 46/51]
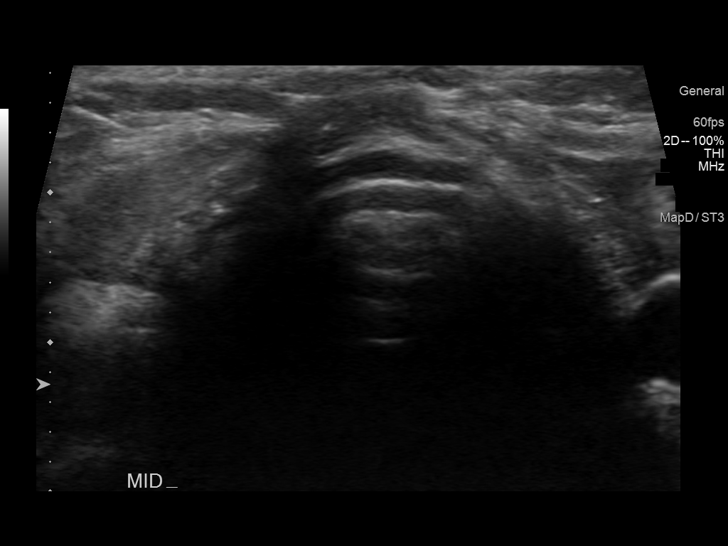
[im 51/51]
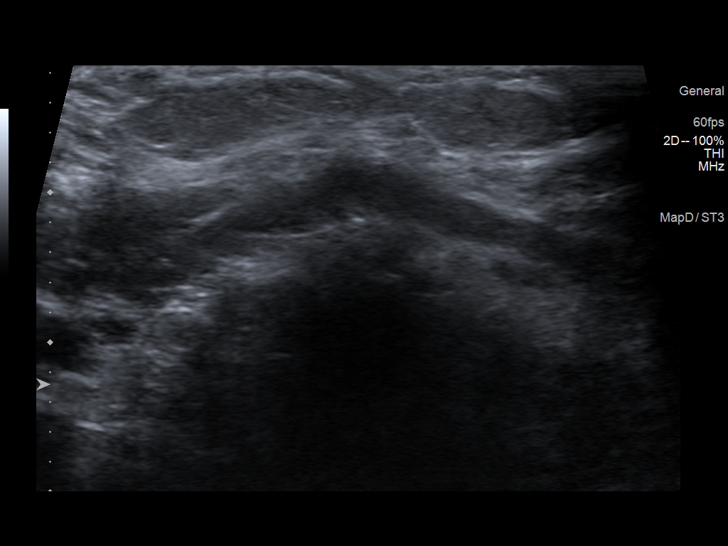

[14 of 25 positions shown; findings below may reference images not displayed]

FINDINGS: Again noted is slightly echogenic soft tissue in the right
thyroidectomy bed that measures 1.2 x 0.9 x 0.7 cm and previously
measured 1.2 x 0.8 x 0.7 cm.

No evidence for residual or recurrent soft tissue in the left
thyroidectomy bed. No evidence for soft tissue in the expected
region of the isthmus.

No enlarged lymph nodes.
IMPRESSION: Stable soft tissue in the right thyroidectomy bed. No significant
change from the previous examination. No other evidence for
recurrent or residual thyroid tissue.

## 2020-07-24 ENCOUNTER — Telehealth: Payer: Self-pay

## 2020-07-24 NOTE — Telephone Encounter (Signed)
Patient called to make an appt, he did labs in august, do you want to use those or would you like to place more orders and I can mail to him

## 2020-07-25 ENCOUNTER — Other Ambulatory Visit: Payer: Self-pay | Admitting: "Endocrinology

## 2020-07-25 DIAGNOSIS — E1165 Type 2 diabetes mellitus with hyperglycemia: Secondary | ICD-10-CM

## 2020-07-25 DIAGNOSIS — E890 Postprocedural hypothyroidism: Secondary | ICD-10-CM

## 2020-07-25 DIAGNOSIS — IMO0002 Reserved for concepts with insufficient information to code with codable children: Secondary | ICD-10-CM

## 2020-07-25 DIAGNOSIS — C73 Malignant neoplasm of thyroid gland: Secondary | ICD-10-CM

## 2020-07-25 NOTE — Telephone Encounter (Signed)
Mailed patient lab order. Patient will call back for an appt once labs are completed.

## 2020-07-25 NOTE — Telephone Encounter (Signed)
We need new labs on him. I will order.

## 2020-08-03 ENCOUNTER — Other Ambulatory Visit: Payer: Self-pay | Admitting: "Endocrinology

## 2020-08-06 ENCOUNTER — Other Ambulatory Visit: Payer: Self-pay | Admitting: "Endocrinology

## 2020-08-08 ENCOUNTER — Other Ambulatory Visit: Payer: Self-pay | Admitting: "Endocrinology

## 2020-08-15 LAB — COMPREHENSIVE METABOLIC PANEL
ALT: 27 IU/L (ref 0–44)
AST: 19 IU/L (ref 0–40)
Albumin/Globulin Ratio: 1.8 (ref 1.2–2.2)
Albumin: 4.8 g/dL (ref 3.8–4.8)
Alkaline Phosphatase: 83 IU/L (ref 44–121)
BUN/Creatinine Ratio: 16 (ref 10–24)
BUN: 22 mg/dL (ref 8–27)
Bilirubin Total: 0.4 mg/dL (ref 0.0–1.2)
CO2: 23 mmol/L (ref 20–29)
Calcium: 10 mg/dL (ref 8.6–10.2)
Chloride: 99 mmol/L (ref 96–106)
Creatinine, Ser: 1.36 mg/dL — ABNORMAL HIGH (ref 0.76–1.27)
GFR calc Af Amer: 61 mL/min/{1.73_m2} (ref >59)
GFR calc non Af Amer: 53 mL/min/{1.73_m2} — ABNORMAL LOW (ref >59)
Globulin, Total: 2.7 g/dL (ref 1.5–4.5)
Glucose: 222 mg/dL — ABNORMAL HIGH (ref 65–99)
Potassium: 4.6 mmol/L (ref 3.5–5.2)
Sodium: 138 mmol/L (ref 134–144)
Total Protein: 7.5 g/dL (ref 6.0–8.5)

## 2020-08-15 LAB — T4, FREE: Free T4: 1.24 ng/dL (ref 0.82–1.77)

## 2020-08-15 LAB — THYROGLOBULIN ANTIBODY: Thyroglobulin Antibody: <1 IU/mL (ref 0.0–0.9)

## 2020-08-15 LAB — TSH: TSH: 2.3 u[IU]/mL (ref 0.450–4.500)

## 2020-08-15 LAB — THYROGLOBULIN LEVEL: Thyroglobulin (TG-RIA): <2 ng/mL

## 2020-08-30 ENCOUNTER — Other Ambulatory Visit: Payer: Self-pay

## 2020-08-30 ENCOUNTER — Ambulatory Visit (INDEPENDENT_AMBULATORY_CARE_PROVIDER_SITE_OTHER): Payer: Medicare Other | Admitting: "Endocrinology

## 2020-08-30 ENCOUNTER — Encounter: Payer: Self-pay | Admitting: "Endocrinology

## 2020-08-30 VITALS — BP 159/85 | HR 73 | Ht 68.0 in | Wt 209.2 lb

## 2020-08-30 DIAGNOSIS — Z794 Long term (current) use of insulin: Secondary | ICD-10-CM | POA: Diagnosis not present

## 2020-08-30 DIAGNOSIS — I10 Essential (primary) hypertension: Secondary | ICD-10-CM | POA: Diagnosis not present

## 2020-08-30 DIAGNOSIS — E1165 Type 2 diabetes mellitus with hyperglycemia: Secondary | ICD-10-CM | POA: Diagnosis not present

## 2020-08-30 DIAGNOSIS — IMO0002 Reserved for concepts with insufficient information to code with codable children: Secondary | ICD-10-CM

## 2020-08-30 DIAGNOSIS — Z789 Other specified health status: Secondary | ICD-10-CM

## 2020-08-30 DIAGNOSIS — E890 Postprocedural hypothyroidism: Secondary | ICD-10-CM | POA: Diagnosis not present

## 2020-08-30 DIAGNOSIS — E118 Type 2 diabetes mellitus with unspecified complications: Secondary | ICD-10-CM

## 2020-08-30 DIAGNOSIS — E782 Mixed hyperlipidemia: Secondary | ICD-10-CM

## 2020-08-30 LAB — POCT GLYCOSYLATED HEMOGLOBIN (HGB A1C): HbA1c, POC (controlled diabetic range): 9.4 % — AB (ref 0.0–7.0)

## 2020-08-30 MED ORDER — LEVEMIR FLEXTOUCH 100 UNIT/ML ~~LOC~~ SOPN: 60.0000 [IU] | PEN_INJECTOR | Freq: Every day | SUBCUTANEOUS | 2 refills | Status: DC

## 2020-08-30 NOTE — Progress Notes (Signed)
08/30/2020  Endocrinology follow-up note   Subjective:    Patient ID: Dean Hurley, male    DOB: 21-May-1951,   Chief complaint  Follow-up for 1)  History of thyroid malignancy 2) postsurgical hypothyroidism 3)  Uncontrolled type 2 diabetes  4) hyperlipidemia 5) hypertension   Past Medical History:  Diagnosis Date  . Cancer ([place])   . Diabetes mellitus without complication ([place])   . Hypertension   . Thyroid cancer [facility])    Past Surgical History:  Procedure Laterality Date  . BACK SURGERY    . THYROID SURGERY     Thyroidectomy  . TRANSURETHRAL RESECTION OF PROSTATE     Social History   Socioeconomic History  . Marital status: Married    Spouse name: Not on file  . Number of children: Not on file  . Years of education: Not on file  . Highest education level: Not on file  Occupational History  . Not on file  Tobacco Use  . Smoking status: Former Smoker    Types: Cigarettes  . Smokeless tobacco: Never Used  Vaping Use  . Vaping Use: Never used  Substance and Sexual Activity  . Alcohol use: No    Alcohol/week: 0.0 standard drinks  . Drug use: No  . Sexual activity: Not on file  Other Topics Concern  . Not on file  Social History Narrative  . Not on file   Social Determinants of Health   Financial Resource Strain: Not on file  Food Insecurity: Not on file  Transportation Needs: Not on file  Physical Activity: Not on file  Stress: Not on file  Social Connections: Not on file   Outpatient Encounter Medications as of 08/30/2020  Medication Sig  . alum & mag hydroxide-simeth (MAALOX/MYLANTA) 200-200-20 MG/5ML suspension Take 20 mLs by mouth every 6 (six) hours as needed for heartburn.  . B-D ULTRAFINE III SHORT PEN 31G X 8 MM MISC USE WITH INSULIN FOUR TIMES A DAY  . Continuous Blood Gluc Sensor (FREESTYLE LIBRE 14 DAY SENSOR) MISC USE AS DIRECTED FOR 14 DAYS  . diclofenac (VOLTAREN) 75 MG EC tablet Take 75 mg by mouth 2 (two) times daily.   . Evolocumab (REPATHA SURECLICK) 628 MG/ML SOAJ Inject 140 mg into the skin every 14 (fourteen) days.  Marland Kitchen glipiZIDE (GLUCOTROL XL) 5 MG 24 hr tablet TAKE 1 TABLET BY MOUTH EVERY DAY WITH BREAKFAST  . insulin detemir (LEVEMIR FLEXTOUCH) 100 UNIT/ML FlexPen Inject 60 Units into the skin at bedtime.  Marland Kitchen levothyroxine (SYNTHROID) 175 MCG tablet TAKE 1 TABLET BY MOUTH EVERY DAY BEFORE BREAKFAST  . losartan-hydrochlorothiazide (HYZAAR) 50-12.5 MG tablet Take 1 tablet by mouth daily.  . meclizine (ANTIVERT) 25 MG tablet Take 25 mg by mouth 3 (three) times daily as needed for dizziness.  . metFORMIN (GLUCOPHAGE) 1000 MG tablet TAKE 1 TABLET BY MOUTH TWICE A DAY WITH MEALS  . NOVOLOG FLEXPEN 100 UNIT/ML FlexPen INJECT 18-24 UNITS INTO THE SKIN 3 (THREE) TIMES DAILY WITH MEALS.  Marland Kitchen triamcinolone cream (KENALOG) 0.1 % Apply 1 application topically daily as needed (for scaly skin).   . [DISCONTINUED] LEVEMIR FLEXTOUCH 100 UNIT/ML FlexPen INJECT 50 UNITS SUBCUTANEOUSLY TWICE A DAY  . [DISCONTINUED] lisinopril-hydrochlorothiazide (ZESTORETIC) 20-12.5 MG tablet TAKE 1 TABLET BY MOUTH EVERY DAY (Patient not taking: Reported on 08/30/2020)  . [DISCONTINUED] meloxicam (MOBIC) 15 MG tablet Take 15 mg by mouth daily.   No facility-administered encounter medications on  file as of 08/30/2020.   ALLERGIES: Allergies  Allergen Reactions  . Codeine   . Contrast Media [Iodinated Diagnostic Agents]    VACCINATION STATUS:  There is no immunization history on file for this patient.  Diabetes    70 yr old male who underwent total thyroidectomy in [facility] on June 02, 2012.  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 1.7 cm cancer nodule on the right lobe and 0.4 cm cancer nodule on the left lobe. Partial invasion of capsule, margins negative. He underwent thyrogen stimulated I131  thyroid remnant ablation with negative whole body scan for distant metastasis on  July 17, 2013.    May 2014 thyroid /neck ultrasound :  negative for any mass lesion.   Thyrogen stimulated WBS on 10/25/13 was c/w successful ablation of thyroid remnants with no evidence of local recurrence or metastatic disease. He has  had another thyroid ultrasound on 10/11/14 which was negative for residual thyroid tissue. On his next  surveillance thyroid ultrasound on September 6,2016 however he has had a 9 mm residual tissue on the  right thyroid bed, which grew to 12 mm in follow-up ultrasound in December 2016. He was sent to [facility] where he had his prior surgery. Fine needle  aspiration of the residual tissue did not reveal diagnostic material.  - On thyroid/neck ultrasound  on 02/01/2016 showed that the remnant thyroid tissue decreased in size to 1.0 cm, all other  findings unchanged.  - His last  Thyrogen stimulated whole-body scan on  06/21/2016 was  reported to be negative for any iodine avid metastatic thyroid cancer.  - On 06/27/2017 : His previsit ultrasound at an outside facility in [facility] revealed an echogenic focus measuring 1.7cm x 0.7cm x 1.1 cm ( previously 1 cm-1.2 cm in [facility] imaging on 02/01/2016) potentially representing a small amount of residual thyroid tissue versus small lymph node. There is no identifiable tissue on the left, no identifiable isthmus.  -He pushed to get his previsit ultrasound prematurely on August 08, 2017 which showed no significant change in the thyroid remnant at 1.2 cm.  -His most recent  repeat surveillance thyroid/neck ultrasound on February 02, 2018 is unchanged showing stable 1.2 cm nodular tissue ithin the right lobectomy bed, unchanged since December 2016.   September 07, 2018: Thyrogen stimulated whole-body scan negative for any iodine avid metastatic thyroid cancer. -See his previous visit notes. -He was sent for surveillance thyroid/neck ultrasound on August 20, 2019 which revealed stable 1.2 cm thyroid remnant  tissue on the right thyroid bed.  No additional or new findings.    He remains on Synthroid 175 mcg p.o. daily before breakfast.  He reports compliance.  His previsit thyroid function tests are consistent with appropriate replacement.     For his uncontrolled type 2 diabetes, historically not very compliant to his diagnosis.  He is currently you wearing a CGM device-Freestyle Libre.  He did not bring his logs nor insulin administration records.  His point-of-care A1c is 9.4%-consistent with uncontrolled status.    He is currently on Levemir 50 units nightly, Humalog  18 units 3 times daily AC, as well as Metformin 1000 mg p.o. twice daily. He does not have his logs with him today, reports off of his CGM device averaging between 195-210 for the last 30 days.  No hypoglycemia reported nor documented .  Review of Systems Limited as above.   Objective:    BP (!) 159/85  Pulse 73   Ht '5\' 8"'  (1.727 m)   Wt 209 lb 3.2 oz (94.9 kg)   BMI 31.81 kg/m   Wt Readings from Last 3 Encounters:  08/30/20 209 lb 3.2 oz (94.9 kg)  11/17/19 205 lb 9.6 oz (93.3 kg)  10/13/18 212 lb 3.2 oz (96.3 kg)    Physical Exam       CMP Latest Ref Rng & Units 08/01/2020 03/23/2020 07/23/2019  Glucose 65 - 99 mg/dL 222(H) 204(H) -  BUN 8 - 27 mg/dL '22 16 20  ' Creatinine 0.76 - 1.27 mg/dL 1.36(H) 1.35(H) 1.2  Sodium 134 - 144 mmol/L 138 140 -  Potassium 3.5 - 5.2 mmol/L 4.6 4.9 -  Chloride 96 - 106 mmol/L 99 102 -  CO2 20 - 29 mmol/L 23 23 -  Calcium 8.6 - 10.2 mg/dL 10.0 9.8 -  Total Protein 6.0 - 8.5 g/dL 7.5 7.4 -  Total Bilirubin 0.0 - 1.2 mg/dL 0.4 0.3 -  Alkaline Phos 44 - 121 IU/L 83 90 -  AST 0 - 40 IU/L 19 13 -  ALT 0 - 44 IU/L 27 24 -   Recent Results (from the past 2160 hour(s))  Comprehensive metabolic panel     Status: Abnormal   Collection Time: 08/01/20 12:43 PM  Result Value Ref Range   Glucose 222 (H) 65 - 99 mg/dL   BUN 22 8 - 27 mg/dL   Creatinine, Ser 1.36 (H) 0.76 - 1.27  mg/dL   GFR calc non Af Amer 53 (L) >59 mL/min/1.73   GFR calc Af Amer 61 >59 mL/min/1.73    Comment: **In accordance with recommendations from the NKF-ASN Task force,**   [facility] is in the process of updating its eGFR calculation to the   2021 CKD-EPI creatinine equation that estimates kidney function   without a race variable.    BUN/Creatinine Ratio 16 10 - 24   Sodium 138 134 - 144 mmol/L   Potassium 4.6 3.5 - 5.2 mmol/L   Chloride 99 96 - 106 mmol/L   CO2 23 20 - 29 mmol/L   Calcium 10.0 8.6 - 10.2 mg/dL   Total Protein 7.5 6.0 - 8.5 g/dL   Albumin 4.8 3.8 - 4.8 g/dL   Globulin, Total 2.7 1.5 - 4.5 g/dL   Albumin/Globulin Ratio 1.8 1.2 - 2.2   Bilirubin Total 0.4 0.0 - 1.2 mg/dL   Alkaline Phosphatase 83 44 - 121 IU/L    Comment:               **Please note reference interval change**   AST 19 0 - 40 IU/L   ALT 27 0 - 44 IU/L  TSH     Status: None   Collection Time: 08/01/20 12:43 PM  Result Value Ref Range   TSH 2.300 0.450 - 4.500 uIU/mL  T4, free     Status: None   Collection Time: 08/01/20 12:43 PM  Result Value Ref Range   Free T4 1.24 0.82 - 1.77 ng/dL  Thyroglobulin antibody     Status: None   Collection Time: 08/01/20 12:43 PM  Result Value Ref Range   Thyroglobulin Antibody <1.0 0.0 - 0.9 IU/mL    Comment: Thyroglobulin Antibody measured by [employer] Coul[employer] Methodology  Thyroglobulin Level     Status: None   Collection Time: 08/01/20 12:43 PM  Result Value Ref Range   Thyroglobulin (TG-RIA) <2.0 ng/mL    Comment: This test was developed and its performance characteristics determined by [facility]. It has  not been cleared or approved by the Food and Drug Administration. Reference Range: Pubertal Children and Adults: <40 According to the [facility] of Clinical Biochemistry, the reference interval for Thyroglobulin (TG) should be related to euthyroid patients and not for patients who underwent thyroidectomy.  TG reference intervals for these patients  depend on the residual mass of the thyroid tissue left after surgery.  Establishing a post-operative baseline is recommended.  The assay quantitation limit is 2.0 ng/mL.   HgB A1c     Status: Abnormal   Collection Time: 08/30/20 11:53 AM  Result Value Ref Range   Hemoglobin A1C     HbA1c POC (<> result, manual entry)     HbA1c, POC (prediabetic range)     HbA1c, POC (controlled diabetic range) 9.4 (A) 0.0 - 7.0 %      Thyrogen stimulated whole-body scan on 06/21/2016: IMPRESSION: Negative whole-body I 131 scan for recurrent iodine avid thyroid Cancer.  06/14/2016 thyroglobulin antibodies negative, thyroglobulin by IME less than 0.1  06/27/2017 - His previsit ultrasound at an outside facility in [facility] revealed an echogenic focus measuring 1.7cm x 0.7cm x 1.1 cm ( previously 1 cm-1.2 cm in [facility] imaging on 02/01/2016) potentially representing a small amount of residual thyroid tissue versus small lymph node. There is no identifiable tissue on the left, no identifiable isthmus.  August 08, 2018 thyroid/neck ultrasound in [facility]:   FINDINGS: Residual solid nodular isoechoic tissue in the right thyroid noted measures 1.2 x 1.0 x 1.0 cm, previously 1.0 x 0.7 x 0.8 cm. No significant interval change. No new abnormality.  Similar ultrasound findings on February 02, 2018. See above.  September 07, 2018 thyroid stimulated whole-body scan negative for iodine avid distant metastasis.  Thyroglobulin by IMA on October 08, 2018 undetectable.  Thyroid/neck surveillance ultrasound August 20, 2019 IMPRESSION: Stable soft tissue in the right thyroidectomy bed. No significant change from the previous examination. No other evidence for recurrent or residual thyroid tissue.  Assessment & Plan:   1.  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 on July 17, 2012, thyrogen stimulated RAI thyroid remnant ablation given his BRAF positive presentation.  The post therapy scan was negative for distant metastasis. His May 2014 thyroid ultrasound is negative for remnant tissue.  Subsequent surveillance thyrogen stimulated WBS on 10/25/13 was c/w successful ablation of thyroid remnants, with no evidence of local recurrence nor metastatic disease.  His repeat thyroid ultrasound on 10/11/14 showed no residual thyroid tissue. However, on his last surveillance thyroid/neck ultrasound she was found to have a new 8m residual/recurrent soft tissue nodule in the right  thyroidectomy bed. On subsequent surveillance thyroid ultrasound this tissue was found to be larger at 12 mm. Attempt to biopsy this up to [facility] was not diagnostic.   - On thyroid/neck ultrasound before this visit on 02/01/2016 showed that the remnant thyroid tissue decreased in size to 1.0 cm, all other  findings unchanged.  There was no sign of residual thyroid tissue on whole-body scan performed 2015.  She is thyroglobulin by IMA was less than 0.1 associated with negative thyroglobulin antibodies on 06/14/2016.  - Thyrogen stimulated whole-body scan on 06/21/2016 was negative for iodine avid metastatic thyroid cancer.   - Most recent thyroid/neck ultrasound on 06/27/2017 showed persistence of possibly thyroid tissue in the right thyroid lobe bed-  this was done within a different facility than his prior imaging studies-subject to variations of measurements.   -His previsit thyroid/neck ultrasound was done on August 08, 2017 prematurely due to his insistence. -The report was that there was no major interval change in the thyroid remnant on the right thyroid bed.  - Previsit repeat  thyroid/neck ultrasound in [facility] in June 2019- shows stable finding of 1.2 cm nodular area within the right lobectomy bed. No intervention at this time. His recent whole-body  scan after Thyrogen stimulation was negative for distant metastasis on September 07, 2018.    Surveillance thyroid/neck ultrasound on January 2021 Stable soft tissue in the right thyroidectomy bed. No significant change from the previous examination. No other evidence for recurrent or residual thyroid tissue. -He will be considered for thyroid/neck ultrasound after his next visit in 6 months.   2. Postsurgical hypothyroidism -His previsit labs are consistent with appropriate replacement.  He is advised to continue Synthroid 175 mcg p.o. daily before breakfast.     - We discussed about the correct intake of his thyroid hormone, on empty stomach at fasting, with water, separated by at least 30 minutes from breakfast and other medications,  and separated by more than 4 hours from calcium, iron, multivitamins, acid reflux medications (PPIs). -Patient is made aware of the fact that thyroid hormone replacement is needed for life, dose to be adjusted by periodic monitoring of thyroid function tests.    3. Uncontrolled type 2 diabetes mellitus with complication, with long-term current use of insulin ([place]) - He has a history of noncompliance.  His recent A1c is higher at 9.4%.   -He will continue to need multiple daily injections of insulin in order for him to control diabetes to target. -He is encouraged to continue to use his freestyle libre CGM device at all times. -He is advised him to increase Levemir to 60 units nightly, advised to continue Humalog 18  units  3 times daily before meals for pre-meal blood glucose readings above 90 mg/dL, plus patient specific correction for readings above 150 mg/dL.    - He is urged to wear his  CGM device at all times and to continue documenting his blood glucose at least 4 times a day-before meals and at bedtime.    -He is assured that he is still benefiting from metformin, advised to lower metformin to 500 mg p.o. twice daily-daily with breakfast and supper.    -He is advised to continue glipizide 5 mg XL p.o. daily at breakfast.  4. Hyperlipidemia: His lipid panel showed controlled/improved LDL at 87 from 136.  He did not tolerate statins.  He is benefiting from [place], advised to continue Repatha again 140 mg subcutaneously every 14 days.     5. Essential hypertension: His blood pressure is not controlled to target.    He is advised to continue on his current medications including lisinopril/HCTZ 20/12.5 mg p.o. daily at breakfast.     POCT ABI Results 08/30/20  His ABI is normal today August 30, 2020. Right ABI: 1.19      left ABI: 1.11  Right leg systolic / diastolic: 259/56 mmHg Left leg systolic / diastolic: 387/56 mmHg  Arm systolic / diastolic: 433/29 mmHG This study will be repeated in 5 years, or sooner if needed.  I advised patient to maintain close follow up with his PCP for primary care needs.  - Time spent on this patient care encounter:  40 min, of which > 50%  was spent in  counseling and the rest reviewing his blood glucose logs , discussing his hypoglycemia and hyperglycemia episodes, reviewing his current and  previous labs / studies  ( including abstraction from other facilities) and medications  doses and developing a  long term treatment plan and documenting his care.   Please refer to Patient Instructions for Blood Glucose Monitoring and Insulin/Medications Dosing Guide"  in media tab for additional information. Please  also refer to " Patient Self Inventory" in the Media  tab for reviewed elements of pertinent patient history.  Stevie Kern participated in the discussions, expressed understanding, and voiced agreement with the above plans.  All questions were answered to his satisfaction. he is encouraged to contact clinic should he have any questions or concerns prior to his return visit.   Follow up plan: Return in about 3 months (around 11/28/2020) for Bring Meter and Logs- A1c in Office.  Glade Lloyd,  MD Phone: [phone]  Fax: [phone]   This note was partially dictated with voice recognition software. Similar sounding words can be transcribed inadequately or may not  be corrected upon review.  08/30/2020, 12:53 PM

## 2020-08-30 NOTE — Patient Instructions (Signed)

## 2020-09-01 ENCOUNTER — Other Ambulatory Visit: Payer: Self-pay | Admitting: "Endocrinology

## 2020-09-01 DIAGNOSIS — E1165 Type 2 diabetes mellitus with hyperglycemia: Secondary | ICD-10-CM

## 2020-09-01 DIAGNOSIS — IMO0002 Reserved for concepts with insufficient information to code with codable children: Secondary | ICD-10-CM

## 2020-09-04 MED ORDER — LEVOTHYROXINE SODIUM 175 MCG PO TABS: ORAL_TABLET | ORAL | 0 refills | Status: DC

## 2020-09-04 MED ORDER — METFORMIN HCL 500 MG PO TABS: 500.0000 mg | ORAL_TABLET | Freq: Two times a day (BID) | ORAL | 0 refills | Status: DC

## 2020-09-04 NOTE — Addendum Note (Signed)
Addended by: Ellin Saba on: 09/04/2020 09:54 AM   Modules accepted: Orders

## 2020-09-04 NOTE — Addendum Note (Signed)
Addended by: Ellin Saba on: 09/04/2020 01:22 PM   Modules accepted: Orders

## 2020-11-01 ENCOUNTER — Other Ambulatory Visit: Payer: Self-pay | Admitting: "Endocrinology

## 2020-11-06 ENCOUNTER — Other Ambulatory Visit: Payer: Self-pay | Admitting: "Endocrinology

## 2020-11-28 ENCOUNTER — Encounter: Payer: Self-pay | Admitting: "Endocrinology

## 2020-11-28 ENCOUNTER — Ambulatory Visit (INDEPENDENT_AMBULATORY_CARE_PROVIDER_SITE_OTHER): Payer: Medicare Other | Admitting: "Endocrinology

## 2020-11-28 ENCOUNTER — Other Ambulatory Visit: Payer: Self-pay

## 2020-11-28 VITALS — BP 126/78 | HR 72 | Ht 68.0 in | Wt 212.6 lb

## 2020-11-28 DIAGNOSIS — Z794 Long term (current) use of insulin: Secondary | ICD-10-CM

## 2020-11-28 DIAGNOSIS — E782 Mixed hyperlipidemia: Secondary | ICD-10-CM | POA: Diagnosis not present

## 2020-11-28 DIAGNOSIS — C73 Malignant neoplasm of thyroid gland: Secondary | ICD-10-CM

## 2020-11-28 DIAGNOSIS — E118 Type 2 diabetes mellitus with unspecified complications: Secondary | ICD-10-CM | POA: Diagnosis not present

## 2020-11-28 DIAGNOSIS — I10 Essential (primary) hypertension: Secondary | ICD-10-CM

## 2020-11-28 DIAGNOSIS — E890 Postprocedural hypothyroidism: Secondary | ICD-10-CM | POA: Diagnosis not present

## 2020-11-28 DIAGNOSIS — E1165 Type 2 diabetes mellitus with hyperglycemia: Secondary | ICD-10-CM | POA: Diagnosis not present

## 2020-11-28 DIAGNOSIS — IMO0002 Reserved for concepts with insufficient information to code with codable children: Secondary | ICD-10-CM

## 2020-11-28 LAB — POCT GLYCOSYLATED HEMOGLOBIN (HGB A1C): HbA1c, POC (controlled diabetic range): 9.5 % — AB (ref 0.0–7.0)

## 2020-11-28 MED ORDER — LEVEMIR FLEXTOUCH 100 UNIT/ML ~~LOC~~ SOPN: 70.0000 [IU] | PEN_INJECTOR | Freq: Every day | SUBCUTANEOUS | 2 refills | Status: DC

## 2020-11-28 NOTE — Progress Notes (Signed)
11/28/2020  Endocrinology follow-up note   Subjective:    Patient ID: Dean Hurley, male    DOB: Aug 30, 1950,   Chief complaint  Follow-up for 1)  History of thyroid malignancy 2) postsurgical hypothyroidism 3)  Uncontrolled type 2 diabetes  4) hyperlipidemia 5) hypertension   Past Medical History:  Diagnosis Date  . Cancer ([place])   . Diabetes mellitus without complication ([place])   . Hypertension   . Thyroid cancer [facility])    Past Surgical History:  Procedure Laterality Date  . BACK SURGERY    . THYROID SURGERY     Thyroidectomy  . TRANSURETHRAL RESECTION OF PROSTATE     Social History   Socioeconomic History  . Marital status: Married    Spouse name: Not on file  . Number of children: Not on file  . Years of education: Not on file  . Highest education level: Not on file  Occupational History  . Not on file  Tobacco Use  . Smoking status: Former Smoker    Types: Cigarettes  . Smokeless tobacco: Never Used  Vaping Use  . Vaping Use: Never used  Substance and Sexual Activity  . Alcohol use: No    Alcohol/week: 0.0 standard drinks  . Drug use: No  . Sexual activity: Not on file  Other Topics Concern  . Not on file  Social History Narrative  . Not on file   Social Determinants of Health   Financial Resource Strain: Not on file  Food Insecurity: Not on file  Transportation Needs: Not on file  Physical Activity: Not on file  Stress: Not on file  Social Connections: Not on file   Outpatient Encounter Medications as of 11/28/2020  Medication Sig  . Coenzyme Q10 (COQ10 PO) Take 1 tablet by mouth daily in the afternoon.  . IRON, FERROUS SULFATE, PO Take 1 tablet by mouth daily in the afternoon.  Marland Kitchen alum & mag hydroxide-simeth (MAALOX/MYLANTA) 200-200-20 MG/5ML suspension Take 20 mLs by mouth every 6 (six) hours as needed for heartburn.  . B-D ULTRAFINE III SHORT PEN 31G X 8 MM MISC USE WITH INSULIN FOUR TIMES A DAY  . Continuous Blood Gluc  Sensor (FREESTYLE LIBRE 14 DAY SENSOR) MISC USE AS DIRECTED FOR 14 DAYS  . diclofenac (VOLTAREN) 75 MG EC tablet Take 75 mg by mouth 2 (two) times daily.  . Evolocumab (REPATHA SURECLICK) 998 MG/ML SOAJ Inject 140 mg into the skin every 14 (fourteen) days.  . fenofibrate 160 MG tablet Take 160 mg by mouth daily.  Marland Kitchen glipiZIDE (GLUCOTROL XL) 5 MG 24 hr tablet TAKE 1 TABLET BY MOUTH EVERY DAY WITH BREAKFAST  . insulin detemir (LEVEMIR FLEXTOUCH) 100 UNIT/ML FlexPen Inject 70 Units into the skin at bedtime.  Marland Kitchen levothyroxine (SYNTHROID) 175 MCG tablet TAKE 1 TABLET BY MOUTH EVERY MORNING BEFORE BREAKFAST  . losartan-hydrochlorothiazide (HYZAAR) 50-12.5 MG tablet Take 1 tablet by mouth daily.  . meclizine (ANTIVERT) 25 MG tablet Take 25 mg by mouth 3 (three) times daily as needed for dizziness.  . metFORMIN (GLUCOPHAGE) 500 MG tablet Take 1 tablet (500 mg total) by mouth 2 (two) times daily with a meal.  . NOVOLOG FLEXPEN 100 UNIT/ML FlexPen INJECT 18-24 UNITS INTO THE SKIN 3 (THREE) TIMES DAILY WITH MEALS.  Marland Kitchen triamcinolone cream (KENALOG) 0.1 % Apply 1 application topically daily as needed (for scaly skin).   . [DISCONTINUED] insulin detemir (LEVEMIR FLEXTOUCH) 100 UNIT/ML FlexPen  Inject 60 Units into the skin at bedtime.   No facility-administered encounter medications on file as of 11/28/2020.   ALLERGIES: Allergies  Allergen Reactions  . Codeine   . Contrast Media [Iodinated Diagnostic Agents]    VACCINATION STATUS:  There is no immunization history on file for this patient.  Diabetes    70 yr old male who underwent total thyroidectomy in [facility] on June 02, 2012.  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 1.7 cm cancer nodule on the right lobe and 0.4 cm cancer nodule on the left lobe. Partial invasion of capsule, margins negative. He underwent thyrogen stimulated I131  thyroid remnant ablation with negative whole body scan for  distant metastasis on July 17, 2013.    May 2014 thyroid /neck ultrasound :  negative for any mass lesion.   Thyrogen stimulated WBS on 10/25/13 was c/w successful ablation of thyroid remnants with no evidence of local recurrence or metastatic disease. He has  had another thyroid ultrasound on 10/11/14 which was negative for residual thyroid tissue. On his next  surveillance thyroid ultrasound on September 6,2016 however he has had a 9 mm residual tissue on the  right thyroid bed, which grew to 12 mm in follow-up ultrasound in December 2016. He was sent to [facility] where he had his prior surgery. Fine needle  aspiration of the residual tissue did not reveal diagnostic material.  - On thyroid/neck ultrasound  on 02/01/2016 showed that the remnant thyroid tissue decreased in size to 1.0 cm, all other  findings unchanged.  - His last  Thyrogen stimulated whole-body scan on  06/21/2016 was  reported to be negative for any iodine avid metastatic thyroid cancer.  - On 06/27/2017 : His  ultrasound at an outside facility in [facility] revealed an echogenic focus measuring 1.7cm x 0.7cm x 1.1 cm ( previously 1 cm-1.2 cm in [facility] imaging on 02/01/2016) potentially representing a small amount of residual thyroid tissue versus small lymph node. There is no identifiable tissue on the left, no identifiable isthmus.  -He pushed to get his previsit ultrasound prematurely on August 08, 2017 which showed no significant change in the thyroid remnant at 1.2 cm.  -His most recent  repeat surveillance thyroid/neck ultrasound on February 02, 2018 is unchanged showing stable 1.2 cm nodular tissue ithin the right lobectomy bed, unchanged since December 2016.   September 07, 2018: Thyrogen stimulated whole-body scan negative for any iodine avid metastatic thyroid cancer. -See his previous visit notes. -He was sent for surveillance thyroid/neck ultrasound on August 20, 2019 which revealed stable 1.2 cm  thyroid remnant tissue on the right thyroid bed.  No additional or new findings.    He remains on Synthroid 175 mcg p.o. daily before breakfast.  He reports compliance.   His recent thyroid function tests were consistent with appropriate replacement.     For his uncontrolled type 2 diabetes, chronically uncontrolled despite wearing  a CGM device-Freestyle Libre.  He brings in his incomplete logs.  His point-of-care A1c is 9.5% unchanged from his last visit.   His most recent 2 weeks average blood glucose is 189, 40% time in range, 55% above range. No major documented or reported hypoglycemia.  He is currently on Levemir 60 units nightly, Humalog  18 units 3 times daily AC, as well as Metformin 1000 mg p.o. twice daily.    Review of Systems Limited as above.   Objective:    BP 126/78  Pulse 72   Ht 5' 8" (1.727 m)   Wt 212 lb 9.6 oz (96.4 kg)   BMI 32.33 kg/m   Wt Readings from Last 3 Encounters:  11/28/20 212 lb 9.6 oz (96.4 kg)  08/30/20 209 lb 3.2 oz (94.9 kg)  11/17/19 205 lb 9.6 oz (93.3 kg)    Physical Exam       CMP Latest Ref Rng & Units 08/01/2020 03/23/2020 07/23/2019  Glucose 65 - 99 mg/dL 222(H) 204(H) -  BUN 8 - 27 mg/dL _0 Creatinine 0.76 - 1.27 mg/dL 1.36(H) 1.35(H) 1.2  Sodium 134 - 144 mmol/L 138 140 -  Potassium 3.5 - 5.2 mmol/L 4.6 4.9 -  Chloride 96 - 106 mmol/L 99 102 -  CO2 20 - 29 mmol/L 23 23 -  Calcium 8.6 - 10.2 mg/dL 10.0 9.8 -  Total Protein 6.0 - 8.5 g/dL 7.5 7.4 -  Total Bilirubin 0.0 - 1.2 mg/dL 0.4 0.3 -  Alkaline Phos 44 - 121 IU/L 83 90 -  AST 0 - 40 IU/L 19 13 -  ALT 0 - 44 IU/L 27 24 -   Recent Results (from the past 2160 hour(s))  HgB A1c     Status: Abnormal   Collection Time: 11/28/20 11:06 AM  Result Value Ref Range   Hemoglobin A1C     HbA1c POC (<> result, manual entry)     HbA1c, POC (prediabetic range)     HbA1c, POC (controlled diabetic range) 9.5 (A) 0.0 - 7.0 %      Thyrogen stimulated whole-body scan  on 06/21/2016: IMPRESSION: Negative whole-body I 131 scan for recurrent iodine avid thyroid Cancer.  06/14/2016 thyroglobulin antibodies negative, thyroglobulin by IME less than 0.1  06/27/2017 - His previsit ultrasound at an outside facility in [facility] revealed an echogenic focus measuring 1.7cm x 0.7cm x 1.1 cm ( previously 1 cm-1.2 cm in [facility] imaging on 02/01/2016) potentially representing a small amount of residual thyroid tissue versus small lymph node. There is no identifiable tissue on the left, no identifiable isthmus.  August 08, 2018 thyroid/neck ultrasound in [facility]:   FINDINGS: Residual solid nodular isoechoic tissue in the right thyroid noted measures 1.2 x 1.0 x 1.0 cm, previously 1.0 x 0.7 x 0.8 cm. No significant interval change. No new abnormality.  Similar ultrasound findings on February 02, 2018. See above.  September 07, 2018 thyroid stimulated whole-body scan negative for iodine avid distant metastasis.  Thyroglobulin by IMA on October 08, 2018 undetectable.  Thyroid/neck surveillance ultrasound August 20, 2019 IMPRESSION: Stable soft tissue in the right thyroidectomy bed. No significant change from the previous examination. No other evidence for recurrent or residual thyroid tissue.  Assessment & Plan:   1.  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 on July 17, 2012, thyrogen stimulated RAI thyroid remnant ablation given his BRAF positive presentation.  The post therapy scan was negative for distant metastasis. His May 2014 thyroid ultrasound is negative for remnant tissue.  Subsequent surveillance thyrogen stimulated WBS on 10/25/13 was c/w successful ablation of thyroid remnants, with no evidence of local recurrence nor metastatic disease.  His repeat thyroid ultrasound on 10/11/14 showed no  residual thyroid tissue. However, on his last surveillance thyroid/neck ultrasound she was found to have a new 91m residual/recurrent soft tissue nodule in the right  thyroidectomy bed. On subsequent surveillance thyroid ultrasound this tissue was found to be larger at 12 mm. Attempt to biopsy this up to [facility] was not diagnostic.   - On thyroid/neck ultrasound before this visit on 02/01/2016 showed that the remnant thyroid tissue decreased in size to 1.0 cm, all other  findings unchanged.  There was no sign of residual thyroid tissue on whole-body scan performed 2015.  She is thyroglobulin by IMA was less than 0.1 associated with negative thyroglobulin antibodies on 06/14/2016.  - Thyrogen stimulated whole-body scan on 06/21/2016 was negative for iodine avid metastatic thyroid cancer.   - Most recent thyroid/neck ultrasound on 06/27/2017 showed persistence of possibly thyroid tissue in the right thyroid lobe bed- this was done within a different facility than his prior imaging studies-subject to variations of measurements.   -His previsit thyroid/neck ultrasound was done on August 08, 2017 prematurely due to his insistence. -The report was that there was no major interval change in the thyroid remnant on the right thyroid bed.  - Previsit repeat  thyroid/neck ultrasound in [facility] in June 2019- shows stable finding of 1.2 cm nodular area within the right lobectomy bed. No intervention at this time. His recent whole-body scan after Thyrogen stimulation was negative for distant metastasis on September 07, 2018.    Surveillance thyroid/neck ultrasound on January 2021 Stable soft tissue in the right thyroidectomy bed. No significant change from the previous examination. No other evidence for recurrent or residual thyroid tissue. -He will have a surveillance thyroid/neck ultrasound before his next visit.     2. Postsurgical hypothyroidism -His previsit labs are consistent with  appropriate replacement.  He is advised to continue Synthroid 175 mcg p.o. daily before breakfast.     - We discussed about the correct intake of his thyroid hormone, on empty stomach at fasting, with water, separated by at least 30 minutes from breakfast and other medications,  and separated by more than 4 hours from calcium, iron, multivitamins, acid reflux medications (PPIs). -Patient is made aware of the fact that thyroid hormone replacement is needed for life, dose to be adjusted by periodic monitoring of thyroid function tests.    3. Uncontrolled type 2 diabetes mellitus with complication, with long-term current use of insulin ([place]) - He has a history of noncompliance.  His point-of-care A1c is 9.5%.     -He will continue to need multiple daily injections of insulin in order for him to control diabetes to target. -He is encouraged to continue to use his freestyle libre CGM device at all times. -He is advised him to increase Levemir to 70 units nightly, advised to continue Humalog  18  units  3 times daily before meals for pre-meal blood glucose readings above 90 mg/dL, plus patient specific correction for readings above 150 mg/dL.    - He is urged to wear his  CGM device at all times and to continue documenting his blood glucose at least 4 times a day-before meals and at bedtime.    -He is advised to continue metformin 500 mg p.o. twice daily, glipizide 5 mg XL p.o. daily at breakfast.    4. Hyperlipidemia: His lipid panel showed controlled/improved LDL at 87 from 136.  He did not tolerate statins.  He cannot confirm if he is taking Repatha.  He is on fenofibrate 160 mg p.o. daily.  He is recent LDL was 87.  He will have repeat fasting lipid panel next visit.       5.  Essential hypertension: His blood pressure is controlled to target.  He is advised to continue on his current medications including lisinopril/HCTZ 20/12.5 mg p.o. daily at breakfast.     His ABI is normal today August 30, 2020, his neck study is in January 2027, or sooner if needed.  I advised patient to maintain close follow up with his PCP for primary care needs.    I spent 45 minutes in the care of the patient today including review of labs from [place], Lipids, Thyroid Function, Hematology (current and previous including abstractions from other facilities); face-to-face time discussing  his blood glucose readings/logs, discussing hypoglycemia and hyperglycemia episodes and symptoms, medications doses, his options of short and long term treatment based on the latest standards of care / guidelines;  discussion about incorporating lifestyle medicine;  and documenting the encounter.    Please refer to Patient Instructions for Blood Glucose Monitoring and Insulin/Medications Dosing Guide"  in media tab for additional information. Please  also refer to " Patient Self Inventory" in the Media  tab for reviewed elements of pertinent patient history.  Stevie Kern participated in the discussions, expressed understanding, and voiced agreement with the above plans.  All questions were answered to his satisfaction. he is encouraged to contact clinic should he have any questions or concerns prior to his return visit.    Follow up plan: Return in about 4 months (around 03/30/2021) for F/U with Pre-visit Labs, Meter, Logs, A1c here., Thyroid / Neck Ultrasound.  Glade Lloyd, MD Phone: [phone]  Fax: [phone]   This note was partially dictated with voice recognition software. Similar sounding words can be transcribed inadequately or may not  be corrected upon review.  11/28/2020, 7:52 PM

## 2020-11-28 NOTE — Patient Instructions (Signed)
                                     Advice for Weight Management  -For most of us the best way to lose weight is by diet management. Generally speaking, diet management means consuming less calories intentionally which over time brings about progressive weight loss.  This can be achieved more effectively by restricting carbohydrate consumption to the minimum possible.  So, it is critically important to know your numbers: how much calorie you are consuming and how much calorie you need. More importantly, our carbohydrates sources should be unprocessed or minimally processed complex starch food items.   Sometimes, it is important to balance nutrition by increasing protein intake (animal or plant source), fruits, and vegetables.  -Sticking to a routine mealtime to eat 3 meals a day and avoiding unnecessary snacks is shown to have a big role in weight control. Under normal circumstances, the only time we lose real weight is when we are hungry, so allow hunger to take place- hunger means no food between meal times, only water.  It is not advisable to starve.   -It is better to avoid simple carbohydrates including: Cakes, Sweet Desserts, Ice Cream, Soda (diet and regular), Sweet Tea, Candies, Chips, Cookies, Store Bought Juices, Alcohol in Excess of  1-2 drinks a day, Lemonade,  Artificial Sweeteners, Doughnuts, Coffee Creamers, "Sugar-free" Products, etc, etc.  This is not a complete list.....    -Consulting with certified diabetes educators is proven to provide you with the most accurate and current information on diet.  Also, you may be  interested in discussing diet options/exchanges , we can schedule a visit with Dean Hurley, RDN, CDE for individualized nutrition education.  -Exercise: If you are able: 30 -60 minutes a day ,4 days a week, or 150 minutes a week.  The longer the better.  Combine stretch, strength, and aerobic activities.  If you were told in the  past that you have high risk for cardiovascular diseases, you may seek evaluation by your heart doctor prior to initiating moderate to intense exercise programs.                                  Additional Care Considerations for Diabetes   -Diabetes  is a chronic disease.  The most important care consideration is regular follow-up with your diabetes care provider with the goal being avoiding or delaying its complications and to take advantage of advances in medications and technology.    -Type 2 diabetes is known to coexist with other important comorbidities such as high blood pressure and high cholesterol.  It is critical to control not only the diabetes but also the high blood pressure and high cholesterol to minimize and delay the risk of complications including coronary artery disease, stroke, amputations, blindness, etc.    - Studies showed that people with diabetes will benefit from a class of medications known as ACE inhibitors and statins.  Unless there are specific reasons not to be on these medications, the standard of care is to consider getting one from these groups of medications at an optimal doses.  These medications are generally considered safe and proven to help protect the heart and the kidneys.    - People with diabetes are encouraged to initiate and maintain regular follow-up with eye doctors, foot   doctors, dentists , and if necessary heart and kidney doctors.     - It is highly recommended that people with diabetes quit smoking or stay away from smoking, and get yearly  flu vaccine and pneumonia vaccine at least every 5 years.  One other important lifestyle recommendation is to ensure adequate sleep - at least 6-7 hours of uninterrupted sleep at night.  -Exercise: If you are able: 30 -60 minutes a day, 4 days a week, or 150 minutes a week.  The longer the better.  Combine stretch, strength, and aerobic activities.  If you were told in the past that you have high risk for  cardiovascular diseases, you may seek evaluation by your heart doctor prior to initiating moderate to intense exercise programs.          

## 2020-12-01 ENCOUNTER — Other Ambulatory Visit: Payer: Self-pay | Admitting: "Endocrinology

## 2020-12-04 ENCOUNTER — Other Ambulatory Visit: Payer: Self-pay | Admitting: "Endocrinology

## 2020-12-04 DIAGNOSIS — E1165 Type 2 diabetes mellitus with hyperglycemia: Secondary | ICD-10-CM

## 2020-12-04 DIAGNOSIS — IMO0002 Reserved for concepts with insufficient information to code with codable children: Secondary | ICD-10-CM

## 2021-01-31 ENCOUNTER — Other Ambulatory Visit: Payer: Self-pay | Admitting: "Endocrinology

## 2021-02-04 ENCOUNTER — Other Ambulatory Visit: Payer: Self-pay | Admitting: "Endocrinology

## 2021-03-06 ENCOUNTER — Ambulatory Visit (HOSPITAL_COMMUNITY): Admission: RE | Admit: 2021-03-06 | Payer: Medicare Other | Source: Ambulatory Visit

## 2021-03-22 ENCOUNTER — Other Ambulatory Visit: Payer: Self-pay | Admitting: "Endocrinology

## 2021-03-22 DIAGNOSIS — E1165 Type 2 diabetes mellitus with hyperglycemia: Secondary | ICD-10-CM

## 2021-03-22 DIAGNOSIS — IMO0002 Reserved for concepts with insufficient information to code with codable children: Secondary | ICD-10-CM

## 2021-03-30 ENCOUNTER — Ambulatory Visit: Payer: Medicare Other | Admitting: "Endocrinology

## 2021-04-03 ENCOUNTER — Ambulatory Visit (HOSPITAL_COMMUNITY)
Admission: RE | Admit: 2021-04-03 | Discharge: 2021-04-03 | Disposition: A | Discharge status: Home / Self Care | Payer: Medicare Other | Source: Ambulatory Visit | Attending: "Endocrinology | Admitting: "Endocrinology

## 2021-04-03 ENCOUNTER — Other Ambulatory Visit: Payer: Self-pay

## 2021-04-03 DIAGNOSIS — C73 Malignant neoplasm of thyroid gland: Secondary | ICD-10-CM | POA: Insufficient documentation

## 2021-04-09 ENCOUNTER — Ambulatory Visit (INDEPENDENT_AMBULATORY_CARE_PROVIDER_SITE_OTHER): Payer: Medicare Other | Admitting: "Endocrinology

## 2021-04-09 ENCOUNTER — Encounter: Payer: Self-pay | Admitting: "Endocrinology

## 2021-04-09 ENCOUNTER — Other Ambulatory Visit: Payer: Self-pay

## 2021-04-09 VITALS — BP 143/86 | HR 66 | Ht 68.0 in | Wt 208.4 lb

## 2021-04-09 DIAGNOSIS — E890 Postprocedural hypothyroidism: Secondary | ICD-10-CM | POA: Diagnosis not present

## 2021-04-09 DIAGNOSIS — Z794 Long term (current) use of insulin: Secondary | ICD-10-CM | POA: Diagnosis not present

## 2021-04-09 DIAGNOSIS — E1165 Type 2 diabetes mellitus with hyperglycemia: Secondary | ICD-10-CM | POA: Diagnosis not present

## 2021-04-09 DIAGNOSIS — IMO0002 Reserved for concepts with insufficient information to code with codable children: Secondary | ICD-10-CM

## 2021-04-09 DIAGNOSIS — E782 Mixed hyperlipidemia: Secondary | ICD-10-CM | POA: Diagnosis not present

## 2021-04-09 DIAGNOSIS — E118 Type 2 diabetes mellitus with unspecified complications: Secondary | ICD-10-CM

## 2021-04-09 DIAGNOSIS — C73 Malignant neoplasm of thyroid gland: Secondary | ICD-10-CM

## 2021-04-09 DIAGNOSIS — I10 Essential (primary) hypertension: Secondary | ICD-10-CM | POA: Diagnosis not present

## 2021-04-09 LAB — POCT GLYCOSYLATED HEMOGLOBIN (HGB A1C): HbA1c, POC (controlled diabetic range): 9.1 % — AB (ref 0.0–7.0)

## 2021-04-09 MED ORDER — REPATHA 140 MG/ML ~~LOC~~ SOSY: 140.0000 mg | PREFILLED_SYRINGE | SUBCUTANEOUS | 2 refills | Status: DC

## 2021-04-09 MED ORDER — LEVEMIR FLEXTOUCH 100 UNIT/ML ~~LOC~~ SOPN: 80.0000 [IU] | PEN_INJECTOR | Freq: Every day | SUBCUTANEOUS | 2 refills | Status: DC

## 2021-04-09 NOTE — Patient Instructions (Signed)

## 2021-04-09 NOTE — Progress Notes (Signed)
04/09/2021  Endocrinology follow-up note   Subjective:    Patient ID: Dean Hurley, male    DOB: 1951/03/25,   Chief complaint  Patient is here to follow-up for 1)  History of thyroid malignancy 2) postsurgical hypothyroidism 3)  Uncontrolled type 2 diabetes  4) hyperlipidemia 5) hypertension   Past Medical History:  Diagnosis Date   Cancer ([place])    Diabetes mellitus without complication ([place])    Hypertension    Thyroid cancer ([place])    Past Surgical History:  Procedure Laterality Date   BACK SURGERY     THYROID SURGERY     Thyroidectomy   TRANSURETHRAL RESECTION OF PROSTATE     Social History   Socioeconomic History   Marital status: Married    Spouse name: Not on file   Number of children: Not on file   Years of education: Not on file   Highest education level: Not on file  Occupational History   Not on file  Tobacco Use   Smoking status: Former    Types: Cigarettes   Smokeless tobacco: Never  Vaping Use   Vaping Use: Never used  Substance and Sexual Activity   Alcohol use: No    Alcohol/week: 0.0 standard drinks   Drug use: No   Sexual activity: Not on file  Other Topics Concern   Not on file  Social History Narrative   Not on file   Social Determinants of Health   Financial Resource Strain: Not on file  Food Insecurity: Not on file  Transportation Needs: Not on file  Physical Activity: Not on file  Stress: Not on file  Social Connections: Not on file   Outpatient Encounter Medications as of 04/09/2021  Medication Sig   Evolocumab (REPATHA) 140 MG/ML SOSY Inject 140 mg into the skin every 14 (fourteen) days.   alum & mag hydroxide-simeth (MAALOX/MYLANTA) 200-200-20 MG/5ML suspension Take 20 mLs by mouth every 6 (six) hours as needed for heartburn.   B-D ULTRAFINE III SHORT PEN 31G X 8 MM MISC USE WITH INSULIN FOUR TIMES A DAY   Coenzyme Q10 (COQ10 PO) Take 1 tablet by mouth daily in the afternoon.   Continuous Blood Gluc  Sensor (FREESTYLE LIBRE 14 DAY SENSOR) MISC USE AS DIRECTED FOR 14 DAYS   diclofenac (VOLTAREN) 75 MG EC tablet Take 75 mg by mouth 2 (two) times daily. (Patient not taking: Reported on 04/09/2021)   fenofibrate 160 MG tablet Take 160 mg by mouth daily.   glipiZIDE (GLUCOTROL XL) 5 MG 24 hr tablet TAKE 1 TABLET BY MOUTH EVERY DAY WITH BREAKFAST   HUMALOG KWIKPEN 100 UNIT/ML KwikPen INJECT 18 UNITS INTO THE SKIN 3 (THREE) TIMES DAILY.   insulin detemir (LEVEMIR FLEXTOUCH) 100 UNIT/ML FlexPen Inject 80 Units into the skin at bedtime.   IRON, FERROUS SULFATE, PO Take 1 tablet by mouth daily in the afternoon.   levothyroxine (SYNTHROID) 175 MCG tablet TAKE 1 TABLET BY MOUTH EVERY DAY BEFORE BREAKFAST   losartan-hydrochlorothiazide (HYZAAR) 50-12.5 MG tablet Take 1 tablet by mouth daily.   meclizine (ANTIVERT) 25 MG tablet Take 25 mg by mouth 3 (three) times daily as needed for dizziness.   metFORMIN (GLUCOPHAGE) 500 MG tablet Take 1 tablet (500 mg total) by mouth 2 (two) times daily with a meal.   triamcinolone cream (KENALOG) 0.1 % Apply 1 application topically daily as needed (for scaly skin).    [DISCONTINUED] Evolocumab (REPATHA  SURECLICK) 140 MG/ML SOAJ Inject 140 mg into the skin every 14 (fourteen) days. (Patient not taking: Reported on 04/09/2021)   [DISCONTINUED] insulin detemir (LEVEMIR FLEXTOUCH) 100 UNIT/ML FlexPen Inject 70 Units into the skin at bedtime.   No facility-administered encounter medications on file as of 04/09/2021.   ALLERGIES: Allergies  Allergen Reactions   Codeine    Contrast Media [Iodinated Diagnostic Agents]    VACCINATION STATUS:  There is no immunization history on file for this patient.  Diabetes   70-year-old male with multiple medical problems. He underwent total thyroidectomy in [facility] on June 02, 2012.  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 1.7 cm cancer nodule on the right lobe and 0.4 cm  cancer nodule on the left lobe. Partial invasion of capsule, margins negative. He underwent thyrogen stimulated I131  thyroid remnant ablation with negative whole body scan for distant metastasis on July 17, 2013.    May 2014 thyroid /neck ultrasound :  negative for any mass lesion.   Thyrogen stimulated WBS on 10/25/13 was c/w successful ablation of thyroid remnants with no evidence of local recurrence or metastatic disease. He has  had another thyroid ultrasound on 10/11/14 which was negative for residual thyroid tissue. On his next  surveillance thyroid ultrasound on September 6,2016 however he has had a 9 mm residual tissue on the  right thyroid bed, which grew to 12 mm in follow-up ultrasound in December 2016. He was sent to [facility] where he had his prior surgery. Fine needle  aspiration of the residual tissue did not reveal diagnostic material.  - On thyroid/neck ultrasound  on 02/01/2016 showed that the remnant thyroid tissue decreased in size to 1.0 cm, all other  findings unchanged.  - His last  Thyrogen stimulated whole-body scan on  06/21/2016 was  reported to be negative for any iodine avid metastatic thyroid cancer.  - On 06/27/2017 : His  ultrasound at an outside facility in [facility] revealed an echogenic focus measuring 1.7cm x 0.7cm x 1.1 cm ( previously 1 cm-1.2 cm in [facility] imaging on 02/01/2016) potentially representing a small amount of residual thyroid tissue versus small lymph node. There is no identifiable tissue on the left, no identifiable isthmus.  -He pushed to get his previsit ultrasound prematurely on August 08, 2017 which showed no significant change in the thyroid remnant at 1.2 cm.  -His most recent  repeat surveillance thyroid/neck ultrasound on February 02, 2018 is unchanged showing stable 1.2 cm nodular tissue ithin the right lobectomy bed, unchanged since December 2016.   September 07, 2018: Thyrogen stimulated whole-body scan negative for  any iodine avid metastatic thyroid cancer. -See his previous visit notes. -He was sent for surveillance thyroid/neck ultrasound on August 20, 2019 which revealed stable 1.2 cm thyroid remnant tissue on the right thyroid bed.  No additional or new findings.   His most recent previsit thyroid/neck ultrasound on April 03, 2021 showed surgical changes of total thyroidectomy, no measurable residual thyroid tissue identified on the current study.   He remains on Synthroid 175 mcg p.o. daily before breakfast.  He reports compliance.   His recent thyroid function tests were consistent with appropriate replacement.     For his uncontrolled type 2 diabetes, chronically uncontrolled despite wearing  a CGM device-Freestyle Libre.  His CGM analysis shows 33% time range, 67% above range.  No hypoglycemia.  His point-of-care A1c is 9.1% only slightly improving from 9.5%.      His most recent 2 weeks average blood glucose is 205.   He is currently on Levemir 70 units nightly, Humalog  18 units 3 times daily AC, as well as Metformin 1000 mg p.o. twice daily.    Review of Systems Limited as above.   Objective:    BP (!) 143/86   Pulse 66   Ht 5' 8" (1.727 m)   Wt 208 lb 6.4 oz (94.5 kg)   BMI 31.69 kg/m   Wt Readings from Last 3 Encounters:  04/09/21 208 lb 6.4 oz (94.5 kg)  11/28/20 212 lb 9.6 oz (96.4 kg)  08/30/20 209 lb 3.2 oz (94.9 kg)    Physical Exam       CMP Latest Ref Rng & Units 04/05/2021 08/01/2020 03/23/2020  Glucose 65 - 99 mg/dL 244(H) 222(H) 204(H)  BUN 8 - 27 mg/dL _0 Creatinine 0.76 - 1.27 mg/dL 1.40(H) 1.36(H) 1.35(H)  Sodium 134 - 144 mmol/L 140 138 140  Potassium 3.5 - 5.2 mmol/L 4.5 4.6 4.9  Chloride 96 - 106 mmol/L 102 99 102  CO2 20 - 29 mmol/L _1 Calcium 8.6 - 10.2 mg/dL 10.3(H) 10.0 9.8  Total Protein 6.0 - 8.5 g/dL 7.3 7.5 7.4  Total Bilirubin 0.0 - 1.2 mg/dL 0.5 0.4 0.3  Alkaline Phos 44 - 121 IU/L 67 83 90  AST 0 - 40 IU/L _2 ALT 0 -  44 IU/L 37 27 24   Recent Results (from the past 2160 hour(s))  Comprehensive metabolic panel     Status: Abnormal   Collection Time: 04/05/21 12:23 PM  Result Value Ref Range   Glucose 244 (H) 65 - 99 mg/dL   BUN 19 8 - 27 mg/dL   Creatinine, Ser 1.40 (H) 0.76 - 1.27 mg/dL   eGFR 54 (L) >59 mL/min/1.73   BUN/Creatinine Ratio 14 10 - 24   Sodium 140 134 - 144 mmol/L   Potassium 4.5 3.5 - 5.2 mmol/L   Chloride 102 96 - 106 mmol/L   CO2 23 20 - 29 mmol/L   Calcium 10.3 (H) 8.6 - 10.2 mg/dL   Total Protein 7.3 6.0 - 8.5 g/dL   Albumin 4.7 3.8 - 4.8 g/dL   Globulin, Total 2.6 1.5 - 4.5 g/dL   Albumin/Globulin Ratio 1.8 1.2 - 2.2   Bilirubin Total 0.5 0.0 - 1.2 mg/dL   Alkaline Phosphatase 67 44 - 121 IU/L   AST 29 0 - 40 IU/L   ALT 37 0 - 44 IU/L  Lipid panel     Status: Abnormal   Collection Time: 04/05/21 12:23 PM  Result Value Ref Range   Cholesterol, Total 218 (H) 100 - 199 mg/dL   Triglycerides 248 (H) 0 - 149 mg/dL   HDL 34 (L) >39 mg/dL   VLDL Cholesterol Cal 45 (H) 5 - 40 mg/dL   LDL Chol Calc (NIH) 139 (H) 0 - 99 mg/dL   Chol/HDL Ratio 6.4 (H) 0.0 - 5.0 ratio    Comment:                                   T. Chol/HDL Ratio  Men  Women                               1/2 Avg.Risk  3.4    3.3                                   Avg.Risk  5.0    4.4                                2X Avg.Risk  9.6    7.1                                3X Avg.Risk 23.4   11.0   TSH     Status: None   Collection Time: 04/05/21 12:23 PM  Result Value Ref Range   TSH 1.170 0.450 - 4.500 uIU/mL  T4, free     Status: None   Collection Time: 04/05/21 12:23 PM  Result Value Ref Range   Free T4 1.47 0.82 - 1.77 ng/dL  Thyroglobulin antibody     Status: None   Collection Time: 04/05/21 12:23 PM  Result Value Ref Range   Thyroglobulin Antibody <1.0 0.0 - 0.9 IU/mL    Comment: Thyroglobulin Antibody measured by [employer] Coulter Methodology  Thyroglobulin  Level     Status: None (Preliminary result)   Collection Time: 04/05/21 12:23 PM  Result Value Ref Range   Thyroglobulin (TG-RIA) WILL FOLLOW   HgB A1c     Status: Abnormal   Collection Time: 04/09/21 10:33 AM  Result Value Ref Range   Hemoglobin A1C     HbA1c POC (<> result, manual entry)     HbA1c, POC (prediabetic range)     HbA1c, POC (controlled diabetic range) 9.1 (A) 0.0 - 7.0 %      Thyrogen stimulated whole-body scan on 06/21/2016: IMPRESSION: Negative whole-body I 131 scan for recurrent iodine avid thyroid Cancer.  06/14/2016 thyroglobulin antibodies negative, thyroglobulin by IME less than 0.1  06/27/2017 - His previsit ultrasound at an outside facility in [facility] revealed an echogenic focus measuring 1.7cm x 0.7cm x 1.1 cm ( previously 1 cm-1.2 cm in [facility] imaging on 02/01/2016) potentially representing a small amount of residual thyroid tissue versus small lymph node. There is no identifiable tissue on the left, no identifiable isthmus.  August 08, 2018 thyroid/neck ultrasound in [facility]:   FINDINGS: Residual solid nodular isoechoic tissue in the right thyroid noted measures 1.2 x 1.0 x 1.0 cm, previously 1.0 x 0.7 x 0.8 cm. No significant interval change. No new abnormality.  Similar ultrasound findings on February 02, 2018. See above.  September 07, 2018 thyroid stimulated whole-body scan negative for iodine avid distant metastasis.  Thyroglobulin by IMA on October 08, 2018 undetectable.  Thyroid/neck surveillance ultrasound August 20, 2019 IMPRESSION: Stable soft tissue in the right thyroidectomy bed. No significant change from the previous examination. No other evidence for recurrent or residual thyroid tissue.  Assessment & Plan:   1.  Papillary Thyroid cancer (HCC):  Patient is s/p total thyroidectomy for stage 1 ( T1bNoMx) PTC , multicentric ( 1.7 cm on right lobe and 0.4cms in the left lobe). on July 17, 2012, thyrogen stimulated RAI thyroid remnant   ablation given his BRAF positive presentation.  The post therapy scan was negative for distant metastasis. His May 2014 thyroid ultrasound is negative for remnant tissue.  Subsequent surveillance thyrogen stimulated WBS on 10/25/13 was c/w successful ablation of thyroid remnants, with no evidence of local recurrence nor metastatic disease.  His repeat thyroid ultrasound on 10/11/14 showed no residual thyroid tissue. However, on his last surveillance thyroid/neck ultrasound she was found to have a new 9mm residual/recurrent soft tissue nodule in the right  thyroidectomy bed. On subsequent surveillance thyroid ultrasound this tissue was found to be larger at 12 mm. Attempt to biopsy this up to [facility] was not diagnostic.   - On thyroid/neck ultrasound before this visit on 02/01/2016 showed that the remnant thyroid tissue decreased in size to 1.0 cm, all other  findings unchanged.  There was no sign of residual thyroid tissue on whole-body scan performed 2015.  She is thyroglobulin by IMA was less than 0.1 associated with negative thyroglobulin antibodies on 06/14/2016.  - Thyrogen stimulated whole-body scan on 06/21/2016 was negative for iodine avid metastatic thyroid cancer.   - Most recent thyroid/neck ultrasound on 06/27/2017 showed persistence of possibly thyroid tissue in the right thyroid lobe bed- this was done within a different facility than his prior imaging studies-subject to variations of measurements.   -His previsit thyroid/neck ultrasound was done on August 08, 2017 prematurely due to his insistence. -The report was that there was no major interval change in the thyroid remnant on the right thyroid bed.  - Previsit repeat  thyroid/neck ultrasound in [facility] in June 2019- shows stable finding of 1.2 cm nodular area within the right lobectomy bed. No intervention at this time. His recent whole-body scan after Thyrogen stimulation  was negative for distant metastasis on September 07, 2018.    Surveillance thyroid/neck ultrasound on January 2021 Stable soft tissue in the right thyroidectomy bed. No significant change from the previous examination. No other evidence for recurrent or residual thyroid tissue. -His previsit thyroid/neck ultrasound is unremarkable.  He is currently 9 years out from his original surgery.  He will not have imaging studies for another year.   2. Postsurgical hypothyroidism -His previsit labs are consistent with appropriate replacement.  He is advised to continue  Synthroid 175 mcg p.o. daily before breakfast.     - We discussed about the correct intake of his thyroid hormone, on empty stomach at fasting, with water, separated by at least 30 minutes from breakfast and other medications,  and separated by more than 4 hours from calcium, iron, multivitamins, acid reflux medications (PPIs). -Patient is made aware of the fact that thyroid hormone replacement is needed for life, dose to be adjusted by periodic monitoring of thyroid function tests.  3. Uncontrolled type 2 diabetes mellitus with complication, with long-term current use of insulin (HCC) - He has a history of noncompliance.  His point-of-care A1c is 9.1%.     -He will continue to need multiple daily injections of insulin in order for him to control diabetes to target. -He is encouraged to continue to use his freestyle libre CGM device at all times. -He is advised to increase his Levemir to 80 units nightly, advised to continue Humalog  18  units  3 times daily before meals for pre-meal blood glucose readings above 90 mg/dL, plus patient specific correction for readings above 150 mg/dL.    - He is urged to wear his  CGM device at all times and to continue documenting   his blood glucose at least 4 times a day-before meals and at bedtime.    -He is advised to continue metformin 500 mg p.o. twice daily, glipizide 5 mg XL p.o. daily at breakfast.     4. Hyperlipidemia:  His lipid panel still abnormal with LDL 139.  He did not go to the pharmacy to check for coverage for his Repatha prescribed last visit.  I will send this prescription again for Repatha 140 mg subcutaneously every other week.  In the meantime, he is advised to continue fenofibrate 160 mg p.o. daily.  He does not tolerate statins.   Plant predominant whole food lifestyle Nutrition is discussed with him.   5. Essential hypertension: -His blood pressure is not controlled to target.  He is advised to continue on his current medications including lisinopril/HCTZ 20/12.5 mg p.o. daily at breakfast.     His ABI is normal today August 30, 2020, his neck study is in January 2027, or sooner if needed.  I advised patient to maintain close follow up with his PCP for primary care needs.   I spent 41 minutes in the care of the patient today including review of labs from CMP, Lipids, Thyroid Function, Hematology (current and previous including abstractions from other facilities); face-to-face time discussing  his blood glucose readings/logs, discussing hypoglycemia and hyperglycemia episodes and symptoms, medications doses, his options of short and long term treatment based on the latest standards of care / guidelines;  discussion about incorporating lifestyle medicine;  and documenting the encounter.    Please refer to Patient Instructions for Blood Glucose Monitoring and Insulin/Medications Dosing Guide"  in media tab for additional information. Please  also refer to " Patient Self Inventory" in the Media  tab for reviewed elements of pertinent patient history.  Samrat Forti participated in the discussions, expressed understanding, and voiced agreement with the above plans.  All questions were answered to his satisfaction. he is encouraged to contact clinic should he have any questions or concerns prior to his return visit.   Follow up plan: Return in about 4 months (around  08/09/2021) for Bring Meter and Logs- A1c in Office.  Gebre Nida, MD Phone: [phone]  Fax: [phone]   This note was partially dictated with voice recognition software. Similar sounding words can be transcribed inadequately or may not  be corrected upon review.  04/09/2021, 11:39 AM 

## 2021-04-11 ENCOUNTER — Other Ambulatory Visit: Payer: Self-pay | Admitting: "Endocrinology

## 2021-04-11 ENCOUNTER — Telehealth: Payer: Self-pay

## 2021-04-11 NOTE — Telephone Encounter (Signed)
Please see message from the prescription request as it states they are asking for an alternative as this medication is too expensive per patient.

## 2021-04-11 NOTE — Telephone Encounter (Signed)
Completed prior authorization for Repatha Inj 140 mg/mL [id number] Approved from 04/09/2021 through 10/09/2021 [facility] dept [phone]

## 2021-04-12 NOTE — Telephone Encounter (Signed)
Patient can not afford this medication. Can you send a different medication that is more affordable?

## 2021-04-20 LAB — COMPREHENSIVE METABOLIC PANEL
ALT: 37 IU/L (ref 0–44)
AST: 29 IU/L (ref 0–40)
Albumin/Globulin Ratio: 1.8 (ref 1.2–2.2)
Albumin: 4.7 g/dL (ref 3.8–4.8)
Alkaline Phosphatase: 67 IU/L (ref 44–121)
BUN/Creatinine Ratio: 14 (ref 10–24)
BUN: 19 mg/dL (ref 8–27)
Bilirubin Total: 0.5 mg/dL (ref 0.0–1.2)
CO2: 23 mmol/L (ref 20–29)
Calcium: 10.3 mg/dL — ABNORMAL HIGH (ref 8.6–10.2)
Chloride: 102 mmol/L (ref 96–106)
Creatinine, Ser: 1.4 mg/dL — ABNORMAL HIGH (ref 0.76–1.27)
Globulin, Total: 2.6 g/dL (ref 1.5–4.5)
Glucose: 244 mg/dL — ABNORMAL HIGH (ref 65–99)
Potassium: 4.5 mmol/L (ref 3.5–5.2)
Sodium: 140 mmol/L (ref 134–144)
Total Protein: 7.3 g/dL (ref 6.0–8.5)
eGFR: 54 mL/min/{1.73_m2} — ABNORMAL LOW (ref >59)

## 2021-04-20 LAB — LIPID PANEL
Chol/HDL Ratio: 6.4 ratio — ABNORMAL HIGH (ref 0.0–5.0)
Cholesterol, Total: 218 mg/dL — ABNORMAL HIGH (ref 100–199)
HDL: 34 mg/dL — ABNORMAL LOW (ref >39)
LDL Chol Calc (NIH): 139 mg/dL — ABNORMAL HIGH (ref 0–99)
Triglycerides: 248 mg/dL — ABNORMAL HIGH (ref 0–149)
VLDL Cholesterol Cal: 45 mg/dL — ABNORMAL HIGH (ref 5–40)

## 2021-04-20 LAB — THYROGLOBULIN LEVEL: Thyroglobulin (TG-RIA): <2 ng/mL

## 2021-04-20 LAB — THYROGLOBULIN ANTIBODY: Thyroglobulin Antibody: <1 IU/mL (ref 0.0–0.9)

## 2021-04-20 LAB — T4, FREE: Free T4: 1.47 ng/dL (ref 0.82–1.77)

## 2021-04-20 LAB — TSH: TSH: 1.17 u[IU]/mL (ref 0.450–4.500)

## 2021-05-02 ENCOUNTER — Other Ambulatory Visit: Payer: Self-pay | Admitting: "Endocrinology

## 2021-05-02 DIAGNOSIS — IMO0002 Reserved for concepts with insufficient information to code with codable children: Secondary | ICD-10-CM

## 2021-05-02 DIAGNOSIS — E1165 Type 2 diabetes mellitus with hyperglycemia: Secondary | ICD-10-CM

## 2021-05-06 ENCOUNTER — Other Ambulatory Visit: Payer: Self-pay | Admitting: "Endocrinology

## 2021-05-12 ENCOUNTER — Other Ambulatory Visit: Payer: Self-pay | Admitting: "Endocrinology

## 2021-05-15 ENCOUNTER — Ambulatory Visit: Payer: Medicare Other

## 2021-05-15 ENCOUNTER — Ambulatory Visit (INDEPENDENT_AMBULATORY_CARE_PROVIDER_SITE_OTHER): Payer: Medicare Other | Admitting: Orthopedic Surgery

## 2021-05-15 ENCOUNTER — Other Ambulatory Visit: Payer: Self-pay | Admitting: Orthopedic Surgery

## 2021-05-15 ENCOUNTER — Encounter: Payer: Self-pay | Admitting: Orthopedic Surgery

## 2021-05-15 ENCOUNTER — Other Ambulatory Visit: Payer: Self-pay

## 2021-05-15 VITALS — BP 141/79 | HR 68 | Ht 67.0 in | Wt 208.0 lb

## 2021-05-15 DIAGNOSIS — M25561 Pain in right knee: Secondary | ICD-10-CM

## 2021-05-15 DIAGNOSIS — M1711 Unilateral primary osteoarthritis, right knee: Secondary | ICD-10-CM

## 2021-05-15 NOTE — Progress Notes (Signed)
New Patient Visit  Assessment: Dean Hurley is a 70 y.o. male with the following: 1. Arthritis of right knee  Plan: Connelly [place] has arthritis in his right knee.  We reviewed the radiographs in clinic today, and I outlined the natural progression.  We had an extensive discussion regarding all potential treatment options, including continuing with the current treatment. NSAIDs are the most appropriate medications, and these are available OTC or via prescription.  I have urged them to remain active, and they can continue with activities on their own, or we can refer them to physical therapy.  We can also consider a brace, or compression sleeve. If the pain is severe enough, we can consider a steroid injection.  If their knee pain is affecting their everyday activities, including sleep, knee replacement is a consideration, but we would have to refer them to see my partner Dr. Aline Brochure.  After discussing all of these options, the patient has elected to proceed with home exercises, Voltaren gel, and a steroid injection.  He also has pain in the anterior aspect of the right shoulder, particularly with overhead lifting.  Strength and range of motion is pretty good.  I have little concern for a significant injury to the rotator cuff, however it is likely that he has irritated the tendons.  Pending the results of the improvement following steroid injection, we could consider a right shoulder injection.  Procedure note injection Right knee joint   Verbal consent was obtained to inject the right knee joint  Timeout was completed to confirm the site of injection.  The skin was prepped with alcohol and ethyl chloride was sprayed at the injection site.  A 21-gauge needle was used to inject 40 mg of Depo-Medrol and 1% lidocaine (3 cc) into the right knee using an anterolateral approach.  There were no complications. A sterile bandage was applied.   Follow-up: Return if symptoms worsen or fail to  improve.  Subjective:  Chief Complaint  Patient presents with   Knee Pain    Right//Pt fell 05/01/21   Shoulder Pain    Right// fell 05/01/21    History of Present Illness: Dean Hurley is a 70 y.o. male who presents for evaluation of right knee pain.  He states he fell approximately 2 weeks ago.  Since then, he had pain in the right knee.  The pain is diffuse.  After the fall, he did develop some swelling immediately.  Since then, the swelling has improved.  He is unable to take NSAIDs, due to the effects on his kidneys.  He has been taking some Tylenol, with limited relief.  He has difficulty walking.  He is also having pain in his right shoulder.  Pain is worsened with overhead lifting.  He has near full range of motion, but does note some pain in the superior aspect of his shoulder.  He has not had an injection in either his knee, or his shoulder.   Review of Systems: No fevers or chills No numbness or tingling No chest pain No shortness of breath No bowel or bladder dysfunction No GI distress No headaches   Medical History:  Past Medical History:  Diagnosis Date   Cancer ([place])    Diabetes mellitus without complication ([place])    Hypertension    Thyroid cancer ([place])     Past Surgical History:  Procedure Laterality Date   BACK SURGERY     THYROID SURGERY     Thyroidectomy   TRANSURETHRAL RESECTION OF PROSTATE  Family History  Problem Relation Age of Onset   Cancer Mother    Cancer Father    Social History   Tobacco Use   Smoking status: Former    Types: Cigarettes   Smokeless tobacco: Never  Vaping Use   Vaping Use: Never used  Substance Use Topics   Alcohol use: No    Alcohol/week: 0.0 standard drinks   Drug use: No    Allergies  Allergen Reactions   Codeine Other (See Comments)    Feels faint, flushed almost passed out almost passed out    Contrast Media [Iodinated Diagnostic Agents]     Current Meds  Medication Sig   alum & mag  hydroxide-simeth (MAALOX/MYLANTA) 494-496-75 MG/5ML suspension Take 20 mLs by mouth every 6 (six) hours as needed for heartburn.   B-D ULTRAFINE III SHORT PEN 31G X 8 MM MISC USE WITH INSULIN FOUR TIMES A DAY   Coenzyme Q10 (COQ10 PO) Take 1 tablet by mouth daily in the afternoon.   Continuous Blood Gluc Sensor (FREESTYLE LIBRE 14 DAY SENSOR) MISC USE AS DIRECTED FOR 14 DAYS   diclofenac (VOLTAREN) 75 MG EC tablet Take 75 mg by mouth 2 (two) times daily.   fenofibrate 160 MG tablet Take 160 mg by mouth daily.   glipiZIDE (GLUCOTROL XL) 5 MG 24 hr tablet TAKE 1 TABLET BY MOUTH EVERY DAY WITH BREAKFAST   HUMALOG KWIKPEN 100 UNIT/ML KwikPen INJECT 18 UNITS INTO THE SKIN 3 (THREE) TIMES DAILY.   insulin detemir (LEVEMIR FLEXTOUCH) 100 UNIT/ML FlexPen Inject 80 Units into the skin at bedtime.   IRON, FERROUS SULFATE, PO Take 1 tablet by mouth daily in the afternoon.   levothyroxine (SYNTHROID) 175 MCG tablet TAKE 1 TABLET BY MOUTH EVERY DAY BEFORE BREAKFAST   losartan-hydrochlorothiazide (HYZAAR) 50-12.5 MG tablet Take 1 tablet by mouth daily.   meclizine (ANTIVERT) 25 MG tablet Take 25 mg by mouth 3 (three) times daily as needed for dizziness.   metFORMIN (GLUCOPHAGE) 500 MG tablet Take 1 tablet (500 mg total) by mouth 2 (two) times daily with a meal.   REPATHA 140 MG/ML SOSY INJECT 140 MG INTO THE SKIN EVERY 14 (FOURTEEN) DAYS.   triamcinolone cream (KENALOG) 0.1 % Apply 1 application topically daily as needed (for scaly skin).     Objective: BP (!) 141/79   Pulse 68   Ht 5\' 7"  (1.702 m)   Wt 208 lb (94.3 kg)   BMI 32.58 kg/m   Physical Exam:  General: Alert and oriented. and No acute distress. Gait: Right sided antalgic gait.  Evaluation of the right knee demonstrates a mild effusion.  Range of motion from 5-120 degrees.  Mild varus alignment overall.  No increased laxity to varus or valgus stress.  Tenderness to palpation along the medial joint line.  Minimal pain with patellar grind.   Negative Lachman.  Evaluation of right shoulder demonstrates near full forward flexion, with some pain in the superior aspect of the shoulder.  Internal rotation of the lumbar spine.  Tenderness palpation of the superior shoulder.  IMAGING: I personally ordered and reviewed the following images  X-rays of the right knee were obtained in clinic today and demonstrates varus overall alignment.  He has near complete loss of joint space, with bone-on-bone articulation within the medial compartment.  Small osteophytes are noted within all 3 compartments.  The right patella is tracking laterally.  Impression: Moderate to severe right knee arthritis, most prominent within the medial compartment   New Medications:  No orders of the defined types were placed in this encounter.     Mordecai Rasmussen, MD  05/15/2021 11:24 PM

## 2021-05-15 NOTE — Patient Instructions (Addendum)
Voltaren gel for pain - available over the counter  Instructions Following Joint Injections  In clinic today, you received an injection in one of your joints (sometimes more than one).  Occasionally, you can have some pain at the injection site, this is normal.  You can place ice at the injection site, or take over-the-counter medications such as Tylenol (acetaminophen) or Advil (ibuprofen).  Please follow all directions listed on the bottle.  If your joint (knee or shoulder) becomes swollen, red or very painful, please contact the clinic for additional assistance.   Two medications were injected, including lidocaine and a steroid (often referred to as cortisone).  Lidocaine is effective almost immediately but wears off quickly.  However, the steroid can take a few days to improve your symptoms.  In some cases, it can make your pain worse for a couple of days.  Do not be concerned if this happens as it is common.  You can apply ice or take some over-the-counter medications as needed.       Knee Exercises  Ask your health care provider which exercises are safe for you. Do exercises exactly as told by your health care provider and adjust them as directed. It is normal to feel mild stretching, pulling, tightness, or discomfort as you do these exercises. Stop right away if you feel sudden pain or your pain gets worse. Do not begin these exercises until told by your health care provider.  Stretching and range-of-motion exercises These exercises warm up your muscles and joints and improve the movement and flexibility of your knee. These exercises also help to relieve pain and swelling.  Knee extension, prone Lie on your abdomen (prone position) on a bed. Place your left / right knee just beyond the edge of the surface so your knee is not on the bed. You can put a towel under your left / right thigh just above your kneecap for comfort. Relax your leg muscles and allow gravity to straighten your knee  (extension). You should feel a stretch behind your left / right knee. Hold this position for 10 seconds. Scoot up so your knee is supported between repetitions. Repeat 10 times. Complete this exercise 3-4 times per week.     Knee flexion, active Lie on your back with both legs straight. If this causes back discomfort, bend your left / right knee so your foot is flat on the floor. Slowly slide your left / right heel back toward your buttocks. Stop when you feel a gentle stretch in the front of your knee or thigh (flexion). Hold this position for 10 seconds. Slowly slide your left / right heel back to the starting position. Repeat 10 times. Complete this exercise 3-4 times per week.      Quadriceps stretch, prone Lie on your abdomen on a firm surface, such as a bed or padded floor. Bend your left / right knee and hold your ankle. If you cannot reach your ankle or pant leg, loop a belt around your foot and grab the belt instead. Gently pull your heel toward your buttocks. Your knee should not slide out to the side. You should feel a stretch in the front of your thigh and knee (quadriceps). Hold this position for 10 seconds. Repeat 10 times. Complete this exercise 3-4 times per week.      Hamstring, supine Lie on your back (supine position). Loop a belt or towel over the ball of your left / right foot. The ball of your foot is  on the walking surface, right under your toes. Straighten your left / right knee and slowly pull on the belt to raise your leg until you feel a gentle stretch behind your knee (hamstring). Do not let your knee bend while you do this. Keep your other leg flat on the floor. Hold this position for 10 seconds. Repeat 10 times. Complete this exercise 3-4 times per week.   Strengthening exercises These exercises build strength and endurance in your knee. Endurance is the ability to use your muscles for a long time, even after they get tired.  Quadriceps, isometric This  exercise stretches the muscles in front of your thigh (quadriceps) without moving your knee joint (isometric). Lie on your back with your left / right leg extended and your other knee bent. Put a rolled towel or small pillow under your knee if told by your health care provider. Slowly tense the muscles in the front of your left / right thigh. You should see your kneecap slide up toward your hip or see increased dimpling just above the knee. This motion will push the back of the knee toward the floor. For 10 seconds, hold the muscle as tight as you can without increasing your pain. Relax the muscles slowly and completely. Repeat 10 times. Complete this exercise 3-4 times per week. .     Straight leg raises This exercise stretches the muscles in front of your thigh (quadriceps) and the muscles that move your hips (hip flexors). Lie on your back with your left / right leg extended and your other knee bent. Tense the muscles in the front of your left / right thigh. You should see your kneecap slide up or see increased dimpling just above the knee. Your thigh may even shake a bit. Keep these muscles tight as you raise your leg 4-6 inches (10-15 cm) off the floor. Do not let your knee bend. Hold this position for 10 seconds. Keep these muscles tense as you lower your leg. Relax your muscles slowly and completely after each repetition. Repeat 10 times. Complete this exercise 3-4 times per week.  Hamstring, isometric Lie on your back on a firm surface. Bend your left / right knee about 30 degrees. Dig your left / right heel into the surface as if you are trying to pull it toward your buttocks. Tighten the muscles in the back of your thighs (hamstring) to "dig" as hard as you can without increasing any pain. Hold this position for 10 seconds. Release the tension gradually and allow your muscles to relax completely for __________ seconds after each repetition. Repeat 10 times. Complete this exercise  3-4 times per week.  Hamstring curls If told by your health care provider, do this exercise while wearing ankle weights. Begin with 5 lb weights. Then increase the weight by 1 lb (0.5 kg) increments. You can also use an exercise band Lie on your abdomen with your legs straight. Bend your left / right knee as far as you can without feeling pain. Keep your hips flat against the floor. Hold this position for 10 seconds. Slowly lower your leg to the starting position. Repeat 10 times. Complete this exercise 3-4 times per week.      Squats This exercise strengthens the muscles in front of your thigh and knee (quadriceps). Stand in front of a table, with your feet and knees pointing straight ahead. You may rest your hands on the table for balance but not for support. Slowly bend your knees and lower  your hips like you are going to sit in a chair. Keep your weight over your heels, not over your toes. Keep your lower legs upright so they are parallel with the table legs. Do not let your hips go lower than your knees. Do not bend lower than told by your health care provider. If your knee pain increases, do not bend as low. Hold the squat position for 10 seconds. Slowly push with your legs to return to standing. Do not use your hands to pull yourself to standing. Repeat 10 times. Complete this exercise 3-4 times per week .     Wall slides This exercise strengthens the muscles in front of your thigh and knee (quadriceps). Lean your back against a smooth wall or door, and walk your feet out 18-24 inches (46-61 cm) from it. Place your feet hip-width apart. Slowly slide down the wall or door until your knees bend 90 degrees. Keep your knees over your heels, not over your toes. Keep your knees in line with your hips. Hold this position for 10 seconds. Repeat 10 times. Complete this exercise 3-4 times per week.      Straight leg raises This exercise strengthens the muscles that rotate the leg at  the hip and move it away from your body (hip abductors). Lie on your side with your left / right leg in the top position. Lie so your head, shoulder, knee, and hip line up. You may bend your bottom knee to help you keep your balance. Roll your hips slightly forward so your hips are stacked directly over each other and your left / right knee is facing forward. Leading with your heel, lift your top leg 4-6 inches (10-15 cm). You should feel the muscles in your outer hip lifting. Do not let your foot drift forward. Do not let your knee roll toward the ceiling. Hold this position for 10 seconds. Slowly return your leg to the starting position. Let your muscles relax completely after each repetition. Repeat 10 times. Complete this exercise 3-4 times per week.      Straight leg raises This exercise stretches the muscles that move your hips away from the front of the pelvis (hip extensors). Lie on your abdomen on a firm surface. You can put a pillow under your hips if that is more comfortable. Tense the muscles in your buttocks and lift your left / right leg about 4-6 inches (10-15 cm). Keep your knee straight as you lift your leg. Hold this position for 10 seconds. Slowly lower your leg to the starting position. Let your leg relax completely after each repetition. Repeat 10 times. Complete this exercise 3-4 times per week.

## 2021-06-11 ENCOUNTER — Other Ambulatory Visit: Payer: Self-pay | Admitting: "Endocrinology

## 2021-08-03 ENCOUNTER — Other Ambulatory Visit: Payer: Self-pay | Admitting: "Endocrinology

## 2021-08-14 ENCOUNTER — Other Ambulatory Visit: Payer: Self-pay | Admitting: "Endocrinology

## 2021-08-14 ENCOUNTER — Ambulatory Visit (INDEPENDENT_AMBULATORY_CARE_PROVIDER_SITE_OTHER): Payer: Medicare Other | Admitting: "Endocrinology

## 2021-08-14 ENCOUNTER — Other Ambulatory Visit: Payer: Self-pay

## 2021-08-14 ENCOUNTER — Encounter: Payer: Self-pay | Admitting: "Endocrinology

## 2021-08-14 VITALS — BP 142/76 | HR 76 | Ht 67.0 in | Wt 208.0 lb

## 2021-08-14 DIAGNOSIS — E782 Mixed hyperlipidemia: Secondary | ICD-10-CM

## 2021-08-14 DIAGNOSIS — Z794 Long term (current) use of insulin: Secondary | ICD-10-CM | POA: Diagnosis not present

## 2021-08-14 DIAGNOSIS — I10 Essential (primary) hypertension: Secondary | ICD-10-CM | POA: Diagnosis not present

## 2021-08-14 DIAGNOSIS — E1169 Type 2 diabetes mellitus with other specified complication: Secondary | ICD-10-CM

## 2021-08-14 DIAGNOSIS — E890 Postprocedural hypothyroidism: Secondary | ICD-10-CM | POA: Diagnosis not present

## 2021-08-14 DIAGNOSIS — E119 Type 2 diabetes mellitus without complications: Secondary | ICD-10-CM | POA: Insufficient documentation

## 2021-08-14 DIAGNOSIS — C73 Malignant neoplasm of thyroid gland: Secondary | ICD-10-CM

## 2021-08-14 LAB — POCT GLYCOSYLATED HEMOGLOBIN (HGB A1C): HbA1c, POC (controlled diabetic range): 9.4 % — AB (ref 0.0–7.0)

## 2021-08-14 MED ORDER — FREESTYLE LIBRE 3 SENSOR MISC: 1.0000 | 2 refills | Status: DC

## 2021-08-14 NOTE — Patient Instructions (Signed)

## 2021-08-14 NOTE — Progress Notes (Signed)
08/14/2021  Endocrinology follow-up note   Subjective:    Patient ID: Dean Hurley, male    DOB: May 18, 1951,   Chief complaint  Patient is here to follow-up for 1)  History of thyroid malignancy 2) postsurgical hypothyroidism 3)  Uncontrolled type 2 diabetes  4) hyperlipidemia 5) hypertension   Past Medical History:  Diagnosis Date   Cancer ([place])    Diabetes mellitus without complication ([place])    Hypertension    Thyroid cancer ([place])    Past Surgical History:  Procedure Laterality Date   BACK SURGERY     THYROID SURGERY     Thyroidectomy   TRANSURETHRAL RESECTION OF PROSTATE     Social History   Socioeconomic History   Marital status: Married    Spouse name: Not on file   Number of children: Not on file   Years of education: Not on file   Highest education level: Not on file  Occupational History   Not on file  Tobacco Use   Smoking status: Former    Types: Cigarettes   Smokeless tobacco: Never  Vaping Use   Vaping Use: Never used  Substance and Sexual Activity   Alcohol use: No    Alcohol/week: 0.0 standard drinks   Drug use: No   Sexual activity: Not on file  Other Topics Concern   Not on file  Social History Narrative   Not on file   Social Determinants of Health   Financial Resource Strain: Not on file  Food Insecurity: Not on file  Transportation Needs: Not on file  Physical Activity: Not on file  Stress: Not on file  Social Connections: Not on file   Outpatient Encounter Medications as of 08/14/2021  Medication Sig   Continuous Blood Gluc Sensor (FREESTYLE LIBRE 3 SENSOR) MISC 1 Piece by Does not apply route every 14 (fourteen) days. Place 1 sensor on the skin every 14 days. Use to check glucose continuously   insulin lispro (HUMALOG) 100 UNIT/ML KwikPen Inject 20-26 Units into the skin 3 (three) times daily before meals.   alum & mag hydroxide-simeth (MAALOX/MYLANTA) 200-200-20 MG/5ML suspension Take 20 mLs by mouth every 6  (six) hours as needed for heartburn.   B-D ULTRAFINE III SHORT PEN 31G X 8 MM MISC USE WITH INSULIN FOUR TIMES A DAY   Coenzyme Q10 (COQ10 PO) Take 1 tablet by mouth daily in the afternoon.   diclofenac (VOLTAREN) 75 MG EC tablet Take 75 mg by mouth 2 (two) times daily.   fenofibrate 160 MG tablet Take 160 mg by mouth daily.   glipiZIDE (GLUCOTROL XL) 5 MG 24 hr tablet TAKE 1 TABLET BY MOUTH EVERY DAY WITH BREAKFAST   insulin detemir (LEVEMIR FLEXTOUCH) 100 UNIT/ML FlexPen Inject 80 Units into the skin at bedtime.   IRON, FERROUS SULFATE, PO Take 1 tablet by mouth daily in the afternoon.   levothyroxine (SYNTHROID) 175 MCG tablet TAKE 1 TABLET BY MOUTH EVERY DAY BEFORE BREAKFAST   losartan-hydrochlorothiazide (HYZAAR) 50-12.5 MG tablet Take 1 tablet by mouth daily.   meclizine (ANTIVERT) 25 MG tablet Take 25 mg by mouth 3 (three) times daily as needed for dizziness.   metFORMIN (GLUCOPHAGE) 500 MG tablet Take 1 tablet (500 mg total) by mouth 2 (two) times daily with a meal.   triamcinolone cream (KENALOG) 0.1 % Apply 1 application topically daily as needed (for scaly skin).    [DISCONTINUED] Continuous Blood Gluc Sensor (  FREESTYLE LIBRE 14 DAY SENSOR) MISC USE AS DIRECTED FOR 14 DAYS   [DISCONTINUED] HUMALOG KWIKPEN 100 UNIT/ML KwikPen INJECT 18 UNITS INTO THE SKIN 3 (THREE) TIMES DAILY.   [DISCONTINUED] REPATHA 140 MG/ML SOSY INJECT 140 MG INTO THE SKIN EVERY 14 (FOURTEEN) DAYS.   No facility-administered encounter medications on file as of 08/14/2021.   ALLERGIES: Allergies  Allergen Reactions   Codeine Other (See Comments)    Feels faint, flushed almost passed out almost passed out    Contrast Media [Iodinated Contrast Media]    VACCINATION STATUS:  There is no immunization history on file for this patient.  Diabetes   71 year old male with multiple medical problems. He underwent total thyroidectomy [facility] on June 02, 2012.  His total thyroidectomy  showed BRAF positive multicentric thyroid cancer. 1.7 cm cancer nodule on the right lobe and 0.4 cm cancer nodule on the left lobe. Partial invasion of capsule, margins negative. He underwent thyrogen stimulated I131  thyroid remnant ablation with negative whole body scan for distant metastasis on July 17, 2013.    May 2014 thyroid /neck ultrasound :  negative for any mass lesion.   Thyrogen stimulated WBS on 10/25/13 was c/w successful ablation of thyroid remnants with no evidence of local recurrence or metastatic disease. He has  had another thyroid ultrasound on 10/11/14 which was negative for residual thyroid tissue. On his next  surveillance thyroid ultrasound on September 6,2016 however he has had a 9 mm residual tissue on the  right thyroid bed, which grew to 12 mm in follow-up ultrasound in December 2016. He was sent to [facility] where he had his prior surgery. Fine needle  aspiration of the residual tissue did not reveal diagnostic material.  - On thyroid/neck ultrasound  on 02/01/2016 showed that the remnant thyroid tissue decreased in size to 1.0 cm, all other  findings unchanged.  - His last  Thyrogen stimulated whole-body scan on  06/21/2016 was  reported to be negative for any iodine avid metastatic thyroid cancer.  - On 06/27/2017 : His  ultrasound at an outside facility in [facility] revealed an echogenic focus measuring 1.7cm x 0.7cm x 1.1 cm ( previously 1 cm-1.2 cm in [facility] imaging on 02/01/2016) potentially representing a small amount of residual thyroid tissue versus small lymph node. There is no identifiable tissue on the left, no identifiable isthmus.  -He pushed to get his previsit ultrasound prematurely on August 08, 2017 which showed no significant change in the thyroid remnant at 1.2 cm.  -His most recent  repeat surveillance thyroid/neck ultrasound on February 02, 2018 is unchanged showing stable 1.2 cm nodular tissue ithin the right lobectomy bed,  unchanged since December 2016.   September 07, 2018: Thyrogen stimulated whole-body scan negative for any iodine avid metastatic thyroid cancer. -See his previous visit notes. -He was sent for surveillance thyroid/neck ultrasound on August 20, 2019 which revealed stable 1.2 cm thyroid remnant tissue on the right thyroid bed.  No additional or new findings.   His most recent previsit thyroid/neck ultrasound on April 03, 2021 showed surgical changes of total thyroidectomy, no measurable residual thyroid tissue identified on the current study.   He is currently on levothyroxine 175 mcg p.o. daily before breakfast.  His previsit thyroid function tests are consistent with appropriate replacement.     For his uncontrolled type 2 diabetes, chronically uncontrolled despite wearing  a CGM device-Freestyle Libre.  His CGM analysis shows 15% time range,  85% above range.  No hypoglycemia.  His point-of-care A1c is 9.4%, unchanged from his previous visits.   He is average blood glucose for the last 2 weeks is 236. He is currently on Levemir 80 units nightly, Humalog 18-24 units 3 times a day, as well as metformin 500 mg p.o. twice daily, glipizide 5 mg XL p.o. daily.      Review of Systems Limited as above.   Objective:    BP (!) 142/76    Pulse 76    Ht '5\' 7"'  (1.702 m)    Wt 208 lb (94.3 kg)    BMI 32.58 kg/m   Wt Readings from Last 3 Encounters:  08/14/21 208 lb (94.3 kg)  05/15/21 208 lb (94.3 kg)  04/09/21 208 lb 6.4 oz (94.5 kg)    Physical Exam       CMP Latest Ref Rng & Units 04/05/2021 08/01/2020 03/23/2020  Glucose 65 - 99 mg/dL 244(H) 222(H) 204(H)  BUN 8 - 27 mg/dL '19 22 16  ' Creatinine 0.76 - 1.27 mg/dL 1.40(H) 1.36(H) 1.35(H)  Sodium 134 - 144 mmol/L 140 138 140  Potassium 3.5 - 5.2 mmol/L 4.5 4.6 4.9  Chloride 96 - 106 mmol/L 102 99 102  CO2 20 - 29 mmol/L '23 23 23  ' Calcium 8.6 - 10.2 mg/dL 10.3(H) 10.0 9.8  Total Protein 6.0 - 8.5 g/dL 7.3 7.5 7.4  Total Bilirubin 0.0 -  1.2 mg/dL 0.5 0.4 0.3  Alkaline Phos 44 - 121 IU/L 67 83 90  AST 0 - 40 IU/L '29 19 13  ' ALT 0 - 44 IU/L 37 27 24   Recent Results (from the past 2160 hour(s))  HgB A1c     Status: Abnormal   Collection Time: 08/14/21 10:08 AM  Result Value Ref Range   Hemoglobin A1C     HbA1c POC (<> result, manual entry)     HbA1c, POC (prediabetic range)     HbA1c, POC (controlled diabetic range) 9.4 (A) 0.0 - 7.0 %      Thyrogen stimulated whole-body scan on 06/21/2016: IMPRESSION: Negative whole-body I 131 scan for recurrent iodine avid thyroid Cancer.  06/14/2016 thyroglobulin antibodies negative, thyroglobulin by IME less than 0.1  06/27/2017 - His previsit ultrasound at an outside facility in [facility] revealed an echogenic focus measuring 1.7cm x 0.7cm x 1.1 cm ( [facility] 1 cm[facility]1.[facility]01/2016) potentially representing a small amount of residual thyroid tissue versus small lymph node. There is no identifiable tissue on the left, no identifiable isthmus.  August 08, 2018 thyroid/neck ultrasound in [facility]:   FINDINGS: Residual solid nodular isoechoic tissue in the right thyroid noted measures 1.2 x 1.0 x 1.0 cm, previously 1.0 x 0.7 x 0.8 cm. No significant interval change. No new abnormality.  Similar ultrasound findings on February 02, 2018. See above.  September 07, 2018 thyroid stimulated whole-body scan negative for iodine avid distant metastasis.  Thyroglobulin by IMA on October 08, 2018 undetectable.  Thyroid/neck surveillance ultrasound August 20, 2019 IMPRESSION: Stable soft tissue in the right thyroidectomy bed. No significant change from the previous examination. No other evidence for recurrent or residual thyroid tissue.  Assessment & Plan:   1.  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 on July 17, 2012, thyrogen stimulated RAI thyroid remnant ablation given his  BRAF positive presentation.  The post therapy scan was negative for distant metastasis. His May 2014 thyroid ultrasound is negative for remnant tissue.  Subsequent surveillance thyrogen stimulated WBS on 10/25/13 was c/w successful ablation of thyroid remnants, with no evidence of local recurrence nor metastatic disease.  His repeat thyroid ultrasound on 10/11/14 showed no residual thyroid tissue. However, on his last surveillance thyroid/neck ultrasound she was found to have a new 2m residual/recurrent soft tissue nodule in the right  thyroidectomy bed. On subsequent surveillance thyroid ultrasound this tissue was found to be larger at 12 mm. Attempt to biopsy this up to [facility] was not diagnostic.   - On thyroid/neck ultrasound before this visit on 02/01/2016 showed that the remnant thyroid tissue decreased in size to 1.0 cm, all other  findings unchanged.  There was no sign of residual thyroid tissue on whole-body scan performed 2015.  She is thyroglobulin by IMA was less than 0.1 associated with negative thyroglobulin antibodies on 06/14/2016.  - Thyrogen stimulated whole-body scan on 06/21/2016 was negative for iodine avid metastatic thyroid cancer.   - Most recent thyroid/neck ultrasound on 06/27/2017 showed persistence of possibly thyroid tissue in the right thyroid lobe bed- this was done within a different facility than his prior imaging studies-subject to variations of measurements.   -His previsit thyroid/neck ultrasound was done on August 08, 2017 prematurely due to his insistence. -The report was that there was no major interval change in the thyroid remnant on the right thyroid bed.  - [facility] shows stable finding of 1.2 cm nodular area within the right lobectomy bed. No intervention at this time. His recent whole-body scan  after Thyrogen stimulation was negative for distant metastasis on September 07, 2018.    Surveillance thyroid/neck ultrasound on January 2021 Stable soft tissue in the right thyroidectomy bed. No significant change from the previous examination. No other evidence for recurrent or residual thyroid tissue. -His previsit thyroid/neck ultrasound is unremarkable.  He is currently 9 years out from his original surgery.  He will not have imaging studies for another year.   2. Postsurgical hypothyroidism -His previsit labs are consistent with appropriate replacement.  He is advised to continue  Synthroid 175 mcg p.o. daily before breakfast.     - We discussed about the correct intake of his thyroid hormone, on empty stomach at fasting, with water, separated by at least 30 minutes from breakfast and other medications,  and separated by more than 4 hours from calcium, iron, multivitamins, acid reflux medications (PPIs). -Patient is made aware of the fact that thyroid hormone replacement is needed for life, dose to be adjusted by periodic monitoring of thyroid function tests.  3. Uncontrolled type 2 diabetes mellitus with complication, with long-term current use of insulin ([place] - He has a history of noncompliance.  His CGM analysis shows 15% time range, 85% above range.  No hypoglycemia.  His point-of-care A1c is 9.4%, unchanged from his previous visits.   He is average blood glucose for the last 2 weeks is 236.  - He is encouraged to continue to wear his CGM device, will be considered for [place] since he is using advanced smart phone.   -He is advised to continue Levemir at 80 units nightly, increase Humalog at 20-26 units 3 times daily AC for [place] blood glucose readings above 90 mg per DL.  -He is advised to continue metformin 500 mg p.o. twice daily, glipizide 5  mg XL p.o. daily at breakfast.    4. Hyperlipidemia:  His lipid panel still abnormal with LDL 139.  He did not get appropriate coverage  for Repatha.  He did not tolerate statins.   - he acknowledges that there is a room for improvement in his food and drink choices. - Suggestion is made for him to avoid simple carbohydrates  from his diet including Cakes, Sweet Desserts, Ice Cream, Soda (diet and regular), Sweet Tea, Candies, Chips, Cookies, Store Bought Juices, Alcohol , Artificial Sweeteners,  Coffee Creamer, and "Sugar-free" Products, Lemonade. This will help patient to have more stable blood glucose profile and potentially avoid unintended weight gain.  The following Lifestyle Medicine recommendations according to [place]  [facility]) were discussed and and offered to patient and he  agrees to start the journey:  A. Whole Foods, Plant-Based Nutrition comprising of fruits and vegetables, plant-based proteins, whole-grain carbohydrates was discussed in detail with the patient.   A list for source of those nutrients were also provided to the patient.  Patient will use only water or unsweetened tea for hydration. B.  The need to stay away from risky substances including alcohol, smoking; obtaining 7 to 9 hours of restorative sleep, at least 150 minutes of moderate intensity exercise weekly, the importance of healthy social connections,  and stress management techniques were discussed. C.  A full color page of  Calorie density of various food groups per pound showing examples of each food groups was provided to the patient.    5. Essential hypertension: -His blood pressure is not controlled to target.  He is advised to continue on his current medications including lisinopril/HCTZ 20/12.5 mg p.o. daily at breakfast.     His ABI is normal today August 30, 2020, his neck study is in January 2027, or sooner if needed.  I advised patient to maintain close follow up with his PCP for primary care needs.   I spent 44 minutes in the care of the patient today including review of labs from [place], Lipids, Thyroid  Function, Hematology (current and previous including abstractions from other facilities); face-to-face time discussing  his blood glucose readings/logs, discussing hypoglycemia and hyperglycemia episodes and symptoms, medications doses, his options of short and long term treatment based on the latest standards of care / guidelines;  discussion about incorporating lifestyle medicine;  and documenting the encounter.    Please refer to Patient Instructions for Blood Glucose Monitoring and Insulin/Medications Dosing Guide"  in media tab for additional information. Please  also refer to " Patient Self Inventory" in the Media  tab for reviewed elements of pertinent patient history.  Stevie Kern participated in the discussions, expressed understanding, and voiced agreement with the above plans.  All questions were answered to his satisfaction. he is encouraged to contact clinic should he have any questions or concerns prior to his return visit.   Follow up plan: Return in about 3 months (around 11/12/2021) for F/U with Pre-visit Labs, Meter, Logs, A1c here.Glade Lloyd, MD Phone: [phone]  Fax: [phone]   This note was partially dictated with voice recognition software. Similar sounding words can be transcribed inadequately or may not  be corrected upon review.  08/14/2021, 12:38 PM

## 2021-08-17 ENCOUNTER — Other Ambulatory Visit: Payer: Self-pay | Admitting: "Endocrinology

## 2021-11-08 LAB — COMPREHENSIVE METABOLIC PANEL
ALT: 45 IU/L — ABNORMAL HIGH (ref 0–44)
AST: 38 IU/L (ref 0–40)
Albumin/Globulin Ratio: 1.9 (ref 1.2–2.2)
Albumin: 4.6 g/dL (ref 3.8–4.8)
Alkaline Phosphatase: 77 IU/L (ref 44–121)
BUN/Creatinine Ratio: 18 (ref 10–24)
BUN: 21 mg/dL (ref 8–27)
Bilirubin Total: 0.4 mg/dL (ref 0.0–1.2)
CO2: 25 mmol/L (ref 20–29)
Calcium: 9.8 mg/dL (ref 8.6–10.2)
Chloride: 105 mmol/L (ref 96–106)
Creatinine, Ser: 1.2 mg/dL (ref 0.76–1.27)
Globulin, Total: 2.4 g/dL (ref 1.5–4.5)
Glucose: 174 mg/dL — ABNORMAL HIGH (ref 70–99)
Potassium: 4.6 mmol/L (ref 3.5–5.2)
Sodium: 142 mmol/L (ref 134–144)
Total Protein: 7 g/dL (ref 6.0–8.5)
eGFR: 65 mL/min/{1.73_m2} (ref >59)

## 2021-11-08 LAB — LIPID PANEL
Chol/HDL Ratio: 6.7 ratio — ABNORMAL HIGH (ref 0.0–5.0)
Cholesterol, Total: 221 mg/dL — ABNORMAL HIGH (ref 100–199)
HDL: 33 mg/dL — ABNORMAL LOW (ref >39)
LDL Chol Calc (NIH): 129 mg/dL — ABNORMAL HIGH (ref 0–99)
Triglycerides: 327 mg/dL — ABNORMAL HIGH (ref 0–149)
VLDL Cholesterol Cal: 59 mg/dL — ABNORMAL HIGH (ref 5–40)

## 2021-11-08 LAB — TSH: TSH: 0.209 u[IU]/mL — ABNORMAL LOW (ref 0.450–4.500)

## 2021-11-08 LAB — T4, FREE: Free T4: 1.25 ng/dL (ref 0.82–1.77)

## 2021-11-12 ENCOUNTER — Ambulatory Visit (INDEPENDENT_AMBULATORY_CARE_PROVIDER_SITE_OTHER): Payer: Medicare Other | Admitting: "Endocrinology

## 2021-11-12 ENCOUNTER — Other Ambulatory Visit: Payer: Self-pay

## 2021-11-12 ENCOUNTER — Encounter: Payer: Self-pay | Admitting: "Endocrinology

## 2021-11-12 VITALS — BP 138/80 | HR 76 | Ht 67.0 in | Wt 210.6 lb

## 2021-11-12 DIAGNOSIS — Z91199 Patient's noncompliance with other medical treatment and regimen due to unspecified reason: Secondary | ICD-10-CM

## 2021-11-12 DIAGNOSIS — E1169 Type 2 diabetes mellitus with other specified complication: Secondary | ICD-10-CM | POA: Diagnosis not present

## 2021-11-12 DIAGNOSIS — E782 Mixed hyperlipidemia: Secondary | ICD-10-CM | POA: Diagnosis not present

## 2021-11-12 DIAGNOSIS — I10 Essential (primary) hypertension: Secondary | ICD-10-CM

## 2021-11-12 DIAGNOSIS — C73 Malignant neoplasm of thyroid gland: Secondary | ICD-10-CM

## 2021-11-12 DIAGNOSIS — Z794 Long term (current) use of insulin: Secondary | ICD-10-CM

## 2021-11-12 DIAGNOSIS — E890 Postprocedural hypothyroidism: Secondary | ICD-10-CM

## 2021-11-12 DIAGNOSIS — Z789 Other specified health status: Secondary | ICD-10-CM

## 2021-11-12 LAB — POCT GLYCOSYLATED HEMOGLOBIN (HGB A1C): HbA1c, POC (controlled diabetic range): 9.1 % — AB (ref 0.0–7.0)

## 2021-11-12 MED ORDER — INSULIN LISPRO (1 UNIT DIAL) 100 UNIT/ML (KWIKPEN): 22.0000 [IU] | PEN_INJECTOR | Freq: Three times a day (TID) | SUBCUTANEOUS | 2 refills | Status: DC

## 2021-11-12 MED ORDER — FREESTYLE LIBRE 3 SENSOR MISC: 1.0000 | 2 refills | Status: DC

## 2021-11-12 MED ORDER — LEVEMIR FLEXTOUCH 100 UNIT/ML ~~LOC~~ SOPN: 80.0000 [IU] | PEN_INJECTOR | Freq: Every day | SUBCUTANEOUS | 2 refills | Status: DC

## 2021-11-12 MED ORDER — LEVEMIR FLEXTOUCH 100 UNIT/ML ~~LOC~~ SOPN: 80.0000 [IU] | PEN_INJECTOR | Freq: Every day | SUBCUTANEOUS | 0 refills | Status: DC

## 2021-11-12 NOTE — Progress Notes (Signed)
?              11/12/2021 ? ?Endocrinology follow-up note ? ? ?Subjective:  ? ? Patient ID: Dean Hurley, male    DOB: 18-Jan-1951,  ? ?Chief complaint ? ?Patient is here to follow-up for ?1)  History of thyroid malignancy ?2) postsurgical hypothyroidism ?3)  Uncontrolled type 2 diabetes  ?4) hyperlipidemia ?5) hypertension ? ? ?Past Medical History:  ?Diagnosis Date  ? [facility])   ? Diabetes mellitus without complication ([place])   ? Hypertension   ? Thyroid cancer ([place])   ? ?Past Surgical History:  ?Procedure Laterality Date  ? BACK SURGERY    ? THYROID SURGERY    ? Thyroidectomy  ? TRANSURETHRAL RESECTION OF PROSTATE    ? ?Social History  ? ?Socioeconomic History  ? Marital status: Married  ?  Spouse name: Not on file  ? Number of children: Not on file  ? Years of education: Not on file  ? Highest education level: Not on file  ?Occupational History  ? Not on file  ?Tobacco Use  ? Smoking status: Former  ?  Types: Cigarettes  ? Smokeless tobacco: Never  ?Vaping Use  ? Vaping Use: Never used  ?Substance and Sexual Activity  ? Alcohol use: No  ?  Alcohol/week: 0.0 standard drinks  ? Drug use: No  ? Sexual activity: Not on file  ?Other Topics Concern  ? Not on file  ?Social History Narrative  ? Not on file  ? ?Social Determinants of Health  ? ?Financial Resource Strain: Not on file  ?Food Insecurity: Not on file  ?Transportation Needs: Not on file  ?Physical Activity: Not on file  ?Stress: Not on file  ?Social Connections: Not on file  ? ?Outpatient Encounter Medications as of 11/12/2021  ?Medication Sig  ? Continuous Blood Gluc Sensor (FREESTYLE LIBRE 3 SENSOR) MISC 1 Piece by Does not apply route every 14 (fourteen) days. Place 1 sensor on the skin every 14 days. Use to check glucose continuously  ? alum & mag hydroxide-simeth (MAALOX/MYLANTA) 200-200-20 MG/5ML suspension Take 20 mLs by mouth every 6 (six) hours as needed for heartburn.  ? B-D ULTRAFINE III SHORT PEN 31G X 8 MM MISC USE WITH INSULIN FOUR TIMES A DAY   ? Coenzyme Q10 (COQ10 PO) Take 1 tablet by mouth daily in the afternoon.  ? diclofenac (VOLTAREN) 75 MG EC tablet Take 75 mg by mouth 2 (two) times daily.  ? fenofibrate 160 MG tablet Take 160 mg by mouth daily.  ? glipiZIDE (GLUCOTROL XL) 5 MG 24 hr tablet TAKE 1 TABLET BY MOUTH EVERY DAY WITH BREAKFAST  ? insulin detemir (LEVEMIR FLEXTOUCH) 100 UNIT/ML FlexPen Inject 80 Units into the skin at bedtime.  ? insulin lispro (HUMALOG) 100 UNIT/ML KwikPen Inject 22-28 Units into the skin 3 (three) times daily before meals.  ? IRON, FERROUS SULFATE, PO Take 1 tablet by mouth daily in the afternoon.  ? levothyroxine (SYNTHROID) 175 MCG tablet TAKE 1 TABLET BY MOUTH EVERY DAY BEFORE BREAKFAST  ? losartan-hydrochlorothiazide (HYZAAR) 50-12.5 MG tablet Take 1 tablet by mouth daily.  ? meclizine (ANTIVERT) 25 MG tablet Take 25 mg by mouth 3 (three) times daily as needed for dizziness.  ? metFORMIN (GLUCOPHAGE) 500 MG tablet Take 1 tablet (500 mg total) by mouth 2 (two) times daily with a meal.  ? triamcinolone cream (KENALOG) 0.1 % Apply 1 application topically daily as needed (for scaly skin).   ? [DISCONTINUED] Continuous Blood Gluc Sensor (  FREESTYLE LIBRE 3 SENSOR) MISC Inject 1 application into the skin every 14 (fourteen) days. Use to check glucose continuously  ? [DISCONTINUED] insulin detemir (LEVEMIR FLEXTOUCH) 100 UNIT/ML FlexPen Inject 80 Units into the skin at bedtime.  ? [DISCONTINUED] insulin lispro (HUMALOG) 100 UNIT/ML KwikPen Inject 22-28 Units into the skin 3 (three) times daily before meals.  ? ?No facility-administered encounter medications on file as of 11/12/2021.  ? ?ALLERGIES: ?Allergies  ?Allergen Reactions  ? Codeine Other (See Comments)  ?  Feels faint, flushed ?almost passed out ?almost passed out ?  ? Contrast Media [Iodinated Contrast Media]   ? ?VACCINATION STATUS: ? ?There is no immunization history on file for this patient. ? ?Diabetes ?  ?71 year old male with multiple medical problems. ?He  underwent total thyroidectomy in [facility] on June 02, 2012.  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 1.7 cm cancer nodule on the right lobe and 0.4 cm cancer nodule on the left lobe. Partial invasion of capsule, margins negative. ?He underwent thyrogen stimulated I131  thyroid remnant ablation with negative whole body scan for distant metastasis on July 17, 2013.  ? ? May 2014 thyroid /neck ultrasound :  negative for any mass lesion.  ? Thyrogen stimulated WBS on 10/25/13 was c/w successful ablation of thyroid remnants with no evidence of local recurrence or metastatic disease. ?He has  had another thyroid ultrasound on 10/11/14 which was negative for residual thyroid tissue. ?On his next  surveillance thyroid ultrasound on September 6,2016 however he has had a 9 mm residual tissue on the  right thyroid bed, which grew to 12 mm in follow-up ultrasound in December 2016. He was sent to [facility] where he had his prior surgery. Fine needle  aspiration of the residual tissue did not reveal diagnostic material. ? ?- On thyroid/neck ultrasound  on 02/01/2016 showed that the remnant thyroid tissue decreased in size to 1.0 cm, all other  findings unchanged. ? ?- His last  Thyrogen stimulated whole-body scan on  06/21/2016 was  reported to be negative for any iodine avid metastatic thyroid cancer. ? ?- On 06/27/2017 : ?His  ultrasound at an outside facility in [facility] revealed an echogenic focus measuring 1.7cm x 0.7cm x 1.1 cm ( previously 1 cm-1.2 cm in [facility] imaging on 02/01/2016) potentially representing a small amount of residual thyroid tissue versus small lymph node. There is no identifiable tissue on the left, no identifiable isthmus. ? ?-He pushed to get his previsit ultrasound prematurely on August 08, 2017 which showed no significant change in the thyroid remnant at 1.2 cm.  ?-His most recent  repeat surveillance thyroid/neck  ultrasound on February 02, 2018 is unchanged showing stable 1.2 cm nodular tissue ithin the right lobectomy bed, unchanged since December 2016.  ? ?September 07, 2018: Thyrogen stimulated whole-body scan negative for any iodine avid metastatic thyroid cancer. ?-See his previous visit notes. ?-He was sent for surveillance thyroid/neck ultrasound on August 20, 2019 which revealed stable 1.2 cm thyroid remnant tissue on the right thyroid bed.  No additional or new findings. ? ? ?His most recent previsit thyroid/neck ultrasound on April 03, 2021 showed surgical changes of total thyroidectomy, no measurable residual thyroid tissue identified on the current study. ? ? He is currently on levothyroxine 175 mcg p.o. daily before breakfast.  His previsit thyroid function tests are consistent with appropriate replacement.   ? ? ?For his uncontrolled type 2 diabetes, chronically uncontrolled despite high-dose insulin,  other medications, and wearing  a CGM device-Freestyle Libre.  His CGM analysis shows 39% time range, 45% level 1 hyperglycemia, 60% level 2 hyperglycemia, no hypoglycemia.   ? ?His point-of-care A1c is 9.1%, unchanged from his last visit A1c.  His average blood glucose is slightly better 199 for the last 14 days. ? ?He is currently on Levemir 80 units nightly, Humalog 20-26 units 3 times a day, as well as metformin 500 mg p.o. twice daily, glipizide 5 mg XL p.o. daily.   ? ? ? ?Review of Systems ?Limited as above. ? ? ?Objective:  ?  ?BP 138/80   Pulse 76   Ht '5\' 7"'  (1.702 m)   Wt 210 lb 9.6 oz (95.5 kg)   BMI 32.98 kg/m?   ?Wt Readings from Last 3 Encounters:  ?11/12/21 210 lb 9.6 oz (95.5 kg)  ?08/14/21 208 lb (94.3 kg)  ?05/15/21 208 lb (94.3 kg)  ?  ?Physical Exam ?   ? ? ? ? ?  Latest Ref Rng & Units 11/07/2021  ?  2:09 PM 04/05/2021  ? 12:23 PM 08/01/2020  ? 12:43 PM  ?CMP  ?Glucose 70 - 99 mg/dL 174   244   222    ?BUN 8 - 27 mg/dL '21   19   22    ' ?Creatinine 0.76 - 1.27 mg/dL 1.20   1.40   1.36    ?Sodium  134 - 144 mmol/L 142   140   138    ?Potassium 3.5 - 5.2 mmol/L 4.6   4.5   4.6    ?Chloride 96 - 106 mmol/L 105   102   99    ?CO2 20 - 29 mmol/L '25   23   23    ' ?Calcium 8.6 - 10.2 mg/dL 9.8   10.3   10.0    ?

## 2021-11-12 NOTE — Patient Instructions (Signed)

## 2021-11-19 ENCOUNTER — Other Ambulatory Visit: Payer: Self-pay | Admitting: "Endocrinology

## 2021-11-19 ENCOUNTER — Telehealth: Payer: Self-pay | Admitting: "Endocrinology

## 2021-11-19 DIAGNOSIS — E119 Type 2 diabetes mellitus without complications: Secondary | ICD-10-CM

## 2021-11-19 DIAGNOSIS — E1169 Type 2 diabetes mellitus with other specified complication: Secondary | ICD-10-CM

## 2021-11-19 MED ORDER — FREESTYLE LIBRE 2 SENSOR MISC: 2 refills | Status: DC

## 2021-11-19 MED ORDER — FREESTYLE LIBRE 2 READER DEVI: 0 refills | Status: AC

## 2021-11-19 MED ORDER — LEVEMIR FLEXTOUCH 100 UNIT/ML ~~LOC~~ SOPN: 80.0000 [IU] | PEN_INJECTOR | Freq: Every day | SUBCUTANEOUS | 2 refills | Status: DC

## 2021-11-19 NOTE — Telephone Encounter (Signed)
Spoke with pharmacist at [facility] on [address] in [place] which stated they did receive the Rx for Humalog, and the Libre 3 but [employer] will not cover the [place] 3. States they did not receive Rx for Levemir. Discussed with pt that his insurance would not cover the [place] 3 sensor but we will send in a Rx for the [place] 2 system and his Levemir. Pt voiced understanding. Rx's for the [place] 2 reader and sensors and Levemir sent to [facility] [address].  ?

## 2021-11-19 NOTE — Telephone Encounter (Signed)
Pt called and said he spoke with [facility] and they have not received any of his medications that were sent 11/12/21. ? ?insulin detemir (LEVEMIR FLEXTOUCH) 100 UNIT/ML FlexPen ? ?insulin lispro (HUMALOG) 100 UNIT/ML KwikPen  ? ?Continuous Blood Gluc Sensor (FREESTYLE LIBRE 3 SENSOR) MISC ? ?[id number] [address] Phone:  [phone] ?Fax:  [phone] ?  ? ?

## 2021-12-11 ENCOUNTER — Other Ambulatory Visit: Payer: Self-pay | Admitting: "Endocrinology

## 2021-12-11 DIAGNOSIS — E118 Type 2 diabetes mellitus with unspecified complications: Secondary | ICD-10-CM

## 2021-12-22 ENCOUNTER — Other Ambulatory Visit: Payer: Self-pay | Admitting: "Endocrinology

## 2021-12-23 ENCOUNTER — Other Ambulatory Visit: Payer: Self-pay | Admitting: "Endocrinology

## 2022-02-13 ENCOUNTER — Ambulatory Visit (INDEPENDENT_AMBULATORY_CARE_PROVIDER_SITE_OTHER): Payer: Medicare Other | Admitting: "Endocrinology

## 2022-02-13 ENCOUNTER — Encounter: Payer: Self-pay | Admitting: "Endocrinology

## 2022-02-13 VITALS — BP 122/76 | HR 60 | Ht 67.0 in | Wt 205.2 lb

## 2022-02-13 DIAGNOSIS — Z794 Long term (current) use of insulin: Secondary | ICD-10-CM | POA: Diagnosis not present

## 2022-02-13 DIAGNOSIS — E1169 Type 2 diabetes mellitus with other specified complication: Secondary | ICD-10-CM | POA: Diagnosis not present

## 2022-02-13 DIAGNOSIS — I10 Essential (primary) hypertension: Secondary | ICD-10-CM

## 2022-02-13 DIAGNOSIS — E118 Type 2 diabetes mellitus with unspecified complications: Secondary | ICD-10-CM | POA: Diagnosis not present

## 2022-02-13 DIAGNOSIS — E890 Postprocedural hypothyroidism: Secondary | ICD-10-CM | POA: Diagnosis not present

## 2022-02-13 DIAGNOSIS — E782 Mixed hyperlipidemia: Secondary | ICD-10-CM

## 2022-02-13 LAB — POCT GLYCOSYLATED HEMOGLOBIN (HGB A1C): HbA1c, POC (controlled diabetic range): 8 % — AB (ref 0.0–7.0)

## 2022-02-13 MED ORDER — INSULIN LISPRO (1 UNIT DIAL) 100 UNIT/ML (KWIKPEN): 10.0000 [IU] | PEN_INJECTOR | Freq: Three times a day (TID) | SUBCUTANEOUS | 1 refills | Status: DC

## 2022-02-13 MED ORDER — LEVEMIR FLEXTOUCH 100 UNIT/ML ~~LOC~~ SOPN: 50.0000 [IU] | PEN_INJECTOR | Freq: Every day | SUBCUTANEOUS | 1 refills | Status: DC

## 2022-02-13 MED ORDER — FREESTYLE LIBRE 2 READER DEVI: 0 refills | Status: AC

## 2022-02-13 MED ORDER — FREESTYLE LIBRE 2 SENSOR MISC: 1.0000 | 3 refills | Status: DC

## 2022-02-13 NOTE — Progress Notes (Signed)
02/13/2022  Endocrinology follow-up note   Subjective:    Patient ID: Dean Hurley, male    DOB: 1950-11-06,   Chief complaint  Patient is here to follow-up for 1)  History of thyroid malignancy 2) postsurgical hypothyroidism 3)  Uncontrolled type 2 diabetes  4) hyperlipidemia 5) hypertension   Past Medical History:  Diagnosis Date   Cancer ([place])    Diabetes mellitus without complication ([place])    Hypertension    Thyroid cancer ([place])    Past Surgical History:  Procedure Laterality Date   BACK SURGERY     THYROID SURGERY     Thyroidectomy   TRANSURETHRAL RESECTION OF PROSTATE     Social History   Socioeconomic History   Marital status: Married    Spouse name: Not on file   Number of children: Not on file   Years of education: Not on file   Highest education level: Not on file  Occupational History   Not on file  Tobacco Use   Smoking status: Former    Types: Cigarettes   Smokeless tobacco: Never  Vaping Use   Vaping Use: Never used  Substance and Sexual Activity   Alcohol use: No    Alcohol/week: 0.0 standard drinks of alcohol   Drug use: No   Sexual activity: Not on file  Other Topics Concern   Not on file  Social History Narrative   Not on file   Social Determinants of Health   Financial Resource Strain: Not on file  Food Insecurity: Not on file  Transportation Needs: Not on file  Physical Activity: Not on file  Stress: Not on file  Social Connections: Not on file   Outpatient Encounter Medications as of 02/13/2022  Medication Sig   Continuous Blood Gluc Receiver (FREESTYLE LIBRE 2 READER) DEVI As directed   Continuous Blood Gluc Sensor (FREESTYLE LIBRE 2 SENSOR) MISC 1 Piece by Does not apply route every 14 (fourteen) days.   alum & mag hydroxide-simeth (MAALOX/MYLANTA) 200-200-20 MG/5ML suspension Take 20 mLs by mouth every 6 (six) hours as needed for heartburn.   B-D ULTRAFINE III SHORT PEN 31G X 8 MM MISC USE WITH INSULIN  FOUR TIMES A DAY   Coenzyme Q10 (COQ10 PO) Take 1 tablet by mouth daily in the afternoon.   Continuous Blood Gluc Receiver (FREESTYLE LIBRE 2 READER) DEVI Use as directed to check glucose   Continuous Blood Gluc Sensor (FREESTYLE LIBRE 2 SENSOR) MISC Change sensor every 14 days   diclofenac (VOLTAREN) 75 MG EC tablet Take 75 mg by mouth 2 (two) times daily.   fenofibrate 160 MG tablet Take 160 mg by mouth daily.   insulin detemir (LEVEMIR FLEXTOUCH) 100 UNIT/ML FlexPen Inject 50 Units into the skin at bedtime.   insulin lispro (HUMALOG KWIKPEN) 100 UNIT/ML KwikPen Inject 10-16 Units into the skin 3 (three) times daily.   IRON, FERROUS SULFATE, PO Take 1 tablet by mouth daily in the afternoon.   levothyroxine (SYNTHROID) 175 MCG tablet TAKE 1 TABLET BY MOUTH EVERY DAY BEFORE BREAKFAST   losartan-hydrochlorothiazide (HYZAAR) 50-12.5 MG tablet Take 1 tablet by mouth daily.   meclizine (ANTIVERT) 25 MG tablet Take 25 mg by mouth 3 (three) times daily as needed for dizziness.   metFORMIN (GLUCOPHAGE) 500 MG tablet Take 1 tablet (500 mg total) by mouth 2 (two) times daily with a meal.   triamcinolone cream (KENALOG) 0.1 % Apply 1 application topically  daily as needed (for scaly skin).    [DISCONTINUED] glipiZIDE (GLUCOTROL XL) 5 MG 24 hr tablet TAKE 1 TABLET BY MOUTH EVERY DAY WITH BREAKFAST   [DISCONTINUED] insulin detemir (LEVEMIR FLEXTOUCH) 100 UNIT/ML FlexPen Inject 80 Units into the skin at bedtime. (Patient taking differently: Inject 50 Units into the skin at bedtime.)   [DISCONTINUED] insulin lispro (HUMALOG KWIKPEN) 100 UNIT/ML KwikPen Inject 22-28 Units into the skin 3 (three) times daily. (Patient taking differently: Inject 10 Units into the skin 3 (three) times daily.)   No facility-administered encounter medications on file as of 02/13/2022.   ALLERGIES: Allergies  Allergen Reactions   Codeine Other (See Comments)    Feels faint, flushed almost passed out almost passed out    Contrast  Media [Iodinated Contrast Media]    VACCINATION STATUS: Immunization History  Administered Date(s) Administered   Moderna Sars-Covid-2 Vaccination 10/26/2019, 11/23/2019, 06/28/2020, 02/05/2021    Diabetes    71 year old male with multiple medical problems. He underwent total thyroidectomy in [facility] on June 02, 2012.  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 1.7 cm cancer nodule on the right lobe and 0.4 cm cancer nodule on the left lobe. Partial invasion of capsule, margins negative. He underwent thyrogen stimulated I131  thyroid remnant ablation with negative whole body scan for distant metastasis on July 17, 2013.    May 2014 thyroid /neck ultrasound :  negative for any mass lesion.   Thyrogen stimulated WBS on 10/25/13 was c/w successful ablation of thyroid remnants with no evidence of local recurrence or metastatic disease. He has  had another thyroid ultrasound on 10/11/14 which was negative for residual thyroid tissue. On his next  surveillance thyroid ultrasound on September 6,2016 however he has had a 9 mm residual tissue on the  right thyroid bed, which grew to 12 mm in follow-up ultrasound in December 2016. He was sent to [facility] where he had his prior surgery. Fine needle  aspiration of the residual tissue did not reveal diagnostic material.  - On thyroid/neck ultrasound  on 02/01/2016 showed that the remnant thyroid tissue decreased in size to 1.0 cm, all other  findings unchanged.  - His last  Thyrogen stimulated whole-body scan on  06/21/2016 was  reported to be negative for any iodine avid metastatic thyroid cancer.  - On 06/27/2017 : His  ultrasound at an outside facility in [facility] revealed an echogenic focus measuring 1.7cm x 0.7cm x 1.1 cm ( previously 1 cm-1.2 cm in [facility] imaging on 02/01/2016) potentially representing a small amount of residual thyroid tissue versus small lymph node.  There is no identifiable tissue on the left, no identifiable isthmus.  -He pushed to get his previsit ultrasound prematurely on August 08, 2017 which showed no significant change in the thyroid remnant at 1.2 cm.  -His most recent  repeat surveillance thyroid/neck ultrasound on February 02, 2018 is unchanged showing stable 1.2 cm nodular tissue ithin the right lobectomy bed, unchanged since December 2016.   September 07, 2018: Thyrogen stimulated whole-body scan negative for any iodine avid metastatic thyroid cancer. -See his previous visit notes. -He was sent for surveillance thyroid/neck ultrasound on August 20, 2019 which revealed stable 1.2 cm thyroid remnant tissue on the right thyroid bed.  No additional or new findings.   His most recent previsit thyroid/neck ultrasound on April 03, 2021 showed surgical changes of total thyroidectomy, no measurable residual thyroid tissue identified on the current study.  He  denies any dysphagia, shortness of breath, nor voice change.  He is currently on levothyroxine 175 mcg p.o. daily before breakfast.  His previsit thyroid function tests are consistent with appropriate replacement.     For his uncontrolled type 2 diabetes, chronically uncontrolled despite high-dose insulin, other medications, and wearing  a CGM device-Freestyle Libre.  Since his last visit, he made some changes in his diet and based on the [employer] of lifestyle medicine diet restrictions discussion.  His CGM analysis shows 74% time range, 24% level 1 hyperglycemia, 2% level 2 hyperglycemia, no hypoglycemia.    His point-of-care A1c is 8%, significantly improving.  His average blood glucose is 161 for the last 14 days.  This is despite the fact that he had lowered his Levemir to 50 units nightly, Humalog to 10 units 3 times daily AC.    He remains on  metformin 500 mg p.o. twice daily, glipizide 5 mg XL p.o. daily.      Review of Systems Limited as above.   Objective:    BP  122/76   Pulse 60   Ht 5' 7" (1.702 m)   Wt 205 lb 3.2 oz (93.1 kg)   BMI 32.14 kg/m   Wt Readings from Last 3 Encounters:  02/13/22 205 lb 3.2 oz (93.1 kg)  11/12/21 210 lb 9.6 oz (95.5 kg)  08/14/21 208 lb (94.3 kg)    Physical Exam          Latest Ref Rng & Units 11/07/2021    2:09 PM 04/05/2021   12:23 PM 08/01/2020   12:43 PM  CMP  Glucose 70 - 99 mg/dL 174  244  222   BUN 8 - 27 mg/dL _0 Creatinine 0.76 - 1.27 mg/dL 1.20  1.40  1.36   Sodium 134 - 144 mmol/L 142  140  138   Potassium 3.5 - 5.2 mmol/L 4.6  4.5  4.6   Chloride 96 - 106 mmol/L 105  102  99   CO2 20 - 29 mmol/L _1 Calcium 8.6 - 10.2 mg/dL 9.8  10.3  10.0   Total Protein 6.0 - 8.5 g/dL 7.0  7.3  7.5   Total Bilirubin 0.0 - 1.2 mg/dL 0.4  0.5  0.4   Alkaline Phos 44 - 121 IU/L 77  67  83   AST 0 - 40 IU/L 38  29  19   ALT 0 - 44 IU/L 45  37  27    Recent Results (from the past 2160 hour(s))  HgB A1c     Status: Abnormal   Collection Time: 02/13/22  9:34 AM  Result Value Ref Range   Hemoglobin A1C     HbA1c POC (<> result, manual entry)     HbA1c, POC (prediabetic range)     HbA1c, POC (controlled diabetic range) 8.0 (A) 0.0 - 7.0 %      Thyrogen stimulated whole-body scan on 06/21/2016: IMPRESSION: Negative whole-body I 131 scan for recurrent iodine avid thyroid Cancer.  06/14/2016 thyroglobulin antibodies negative, thyroglobulin by IME less than 0.1  06/27/2017 - His previsit ultrasound at an outside facility in [facility] revealed an echogenic focus measuring 1.7cm x 0.7cm x 1.1 cm ( previously 1 cm-1.2 cm in [facility] imaging on 02/01/2016) potentially representing a small amount of residual thyroid tissue versus small lymph node. There is no identifiable tissue on the left, no identifiable isthmus.  August 08, 2018 thyroid/neck ultrasound in [facility]:   FINDINGS: Residual solid nodular isoechoic tissue in the right thyroid  noted measures 1.2 x 1.0 x 1.0 cm, previously 1.0 x 0.7 x 0.8 cm. No significant interval change. No new abnormality.  Similar ultrasound findings on February 02, 2018. See above.  September 07, 2018 thyroid stimulated whole-body scan negative for iodine avid distant metastasis.  Thyroglobulin by IMA on October 08, 2018 undetectable.  Thyroid/neck surveillance ultrasound August 20, 2019 IMPRESSION: Stable soft tissue in the right thyroidectomy bed. No significant change from the previous examination. No other evidence for recurrent or residual thyroid tissue.  Assessment & Plan:   1.  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 on July 17, 2012, thyrogen stimulated RAI thyroid remnant ablation given his BRAF positive presentation.  The post therapy scan was negative for distant metastasis. His May 2014 thyroid ultrasound is negative for remnant tissue.  Subsequent surveillance thyrogen stimulated WBS on 10/25/13 was c/w successful ablation of thyroid remnants, with no evidence of local recurrence nor metastatic disease.  His repeat thyroid ultrasound on 10/11/14 showed no residual thyroid tissue. However, on his last surveillance thyroid/neck ultrasound she was found to have a new 70m residual/recurrent soft tissue nodule in the right  thyroidectomy bed. On subsequent surveillance thyroid ultrasound this tissue was found to be larger at 12 mm. Attempt to biopsy this up to [facility] was not diagnostic.   - On thyroid/neck ultrasound before this visit on 02/01/2016 showed that the remnant thyroid tissue decreased in size to 1.0 cm, all other  findings unchanged.  There was no sign of residual thyroid tissue on whole-body scan performed 2015.  She is thyroglobulin by IMA was less than 0.1 associated with negative thyroglobulin antibodies on 06/14/2016.  - Thyrogen stimulated  whole-body scan on 06/21/2016 was negative for iodine avid metastatic thyroid cancer.   - Most recent thyroid/neck ultrasound on 06/27/2017 showed persistence of possibly thyroid tissue in the right thyroid lobe bed- this was done within a different facility than his prior imaging studies-subject to variations of measurements.   -His previsit thyroid/neck ultrasound was done on August 08, 2017 prematurely due to his insistence. -The report was that there was no major interval change in the thyroid remnant on the right thyroid bed.  - [facility] shows stable finding of 1.2 cm nodular area within the right lobectomy bed. No intervention at this time. His recent whole-body scan after Thyrogen stimulation was negative for distant metastasis on September 07, 2018.    Surveillance thyroid/neck ultrasound on January 2021 Stable soft tissue in the right thyroidectomy bed. No significant change from the previous examination. No other evidence for recurrent or residual thyroid tissue. -His previsit thyroid/neck ultrasound is unremarkable.  He is currently 9 years out from his original surgery.    He will be considered for surveillance thyroid ultrasound before his next visit.      2. Postsurgical hypothyroidism -His previsit labs are consistent with appropriate replacement.  He is advised to continue Synthroid 175 mcg p.o. daily before breakfast    - We discussed about the correct intake of his thyroid hormone, on empty stomach at fasting, with water, separated by at least 30 minutes from breakfast and other medications,  and separated by more than 4 hours from calcium,  iron, multivitamins, acid reflux medications (PPIs). -Patient is made aware of the fact that thyroid hormone replacement is needed for life, dose to be adjusted by periodic monitoring of thyroid function tests.  3. Uncontrolled type 2 diabetes mellitus with  complication, with long-term current use of insulin ([place]) - He has engaged better for his diabetes management this time.  For his [place] device analysis, see above.    -His point-of-care A1c is 8%, significantly improving from 9.1% during his last visit.  -He will benefit from freestyle libre 3.  I discussed and prescribed the device for him.    -He is advised to lower her Levemir to 50 units nightly, lower NovoLog to 10-16  units 3 times daily AC for Premeal blood glucose readings above 90 mg per DL. He mains  a good candidate for lifestyle medicine. - he acknowledges that there is a room for improvement in his food and drink choices. - Suggestion is made for him to avoid simple carbohydrates  from his diet including Cakes, Sweet Desserts, Ice Cream, Soda (diet and regular), Sweet Tea, Candies, Chips, Cookies, Store Bought Juices, Alcohol , Artificial Sweeteners,  Coffee Creamer, and "Sugar-free" Products, Lemonade. This will help patient to have more stable blood glucose profile and potentially avoid unintended weight gain.  The following Lifestyle Medicine recommendations according to [facility]) were discussed and and offered to patient and he  agrees to start the journey:  A. Whole Foods, Plant-Based Nutrition comprising of fruits and vegetables, plant-based proteins, whole-grain carbohydrates was discussed in detail with the patient.   A list for source of those nutrients were also provided to the patient.  Patient will use only water or unsweetened tea for hydration. B.  The need to stay away from risky substances including alcohol, smoking; obtaining 7 to 9 hours of restorative sleep, at least 150 minutes of moderate intensity exercise weekly, the importance of healthy social connections,  and stress management techniques were discussed. C.  A full color page of  Calorie density of various food groups per pound showing examples of each food groups was provided  to the patient.    -He is advised to continue metformin 500 mg p.o. twice daily, advised to discontinue glipizide at this time.  4. Hyperlipidemia:  His lipid panel still abnormal with LDL 139.  He did not get appropriate coverage for Repatha.  He does not tolerate statins.  Whole food plant-based diet discussed above will help with dyslipidemia.  5. Essential hypertension: -His blood pressure is controlled to target.  He is advised to continue on his current medications including lisinopril/HCTZ 20/12.5 mg p.o. daily at breakfast.     His ABI is normal today August 30, 2020, his neck study is in January 2027, or sooner if needed.  I advised patient to maintain close follow up with his PCP for primary care needs.   I spent 40 minutes in the care of the patient today including review of labs from [place], Lipids, Thyroid Function, Hematology (current and previous including abstractions from other facilities); face-to-face time discussing  his blood glucose readings/logs, discussing hypoglycemia and hyperglycemia episodes and symptoms, medications doses, his options of short and long term treatment based on the latest standards of care / guidelines;  discussion about incorporating lifestyle medicine;  and documenting the encounter. Risk reduction counseling performed per USPSTF guidelines to reduce obesity and cardiovascular risk factors.     Please refer to Patient Instructions for Blood Glucose Monitoring  and Insulin/Medications Dosing Guide"  in media tab for additional information. Please  also refer to " Patient Self Inventory" in the Media  tab for reviewed elements of pertinent patient history.  Stevie Kern participated in the discussions, expressed understanding, and voiced agreement with the above plans.  All questions were answered to his satisfaction. he is encouraged to contact clinic should he have any questions or concerns prior to his return visit.   Follow up plan: Return  in about 3 months (around 05/16/2022) for F/U with Pre-visit Labs, Meter/CGM/Logs, A1c here, Thyroid / Neck Ultrasound.  Glade Lloyd, MD Phone: [phone]  Fax: [phone]   This note was partially dictated with voice recognition software. Similar sounding words can be transcribed inadequately or may not  be corrected upon review.  02/13/2022, 12:59 PM

## 2022-02-13 NOTE — Patient Instructions (Signed)

## 2022-02-14 ENCOUNTER — Telehealth: Payer: Self-pay

## 2022-02-14 NOTE — Telephone Encounter (Signed)
Order for [employer] 2 system sent to [facility].

## 2022-02-21 ENCOUNTER — Ambulatory Visit (HOSPITAL_COMMUNITY): Payer: Medicare Other

## 2022-02-25 ENCOUNTER — Other Ambulatory Visit: Payer: Self-pay | Admitting: "Endocrinology

## 2022-02-27 ENCOUNTER — Telehealth: Payer: Self-pay | Admitting: "Endocrinology

## 2022-02-27 IMAGING — US US THYROID
1 series · 14 of 17 positions shown · non-contrast
Comparison: 12/29/2017

CLINICAL DATA: 70-year-old male with a history prior thyroidectomy,
status post treatment of thyroid carcinoma

EXAM:
THYROID ULTRASOUND
TECHNIQUE: Ultrasound examination of the thyroid gland and adjacent soft
tissues was performed.

[Series 1: us thyroid · 14 of 17 slices shown]
[im 1/17]
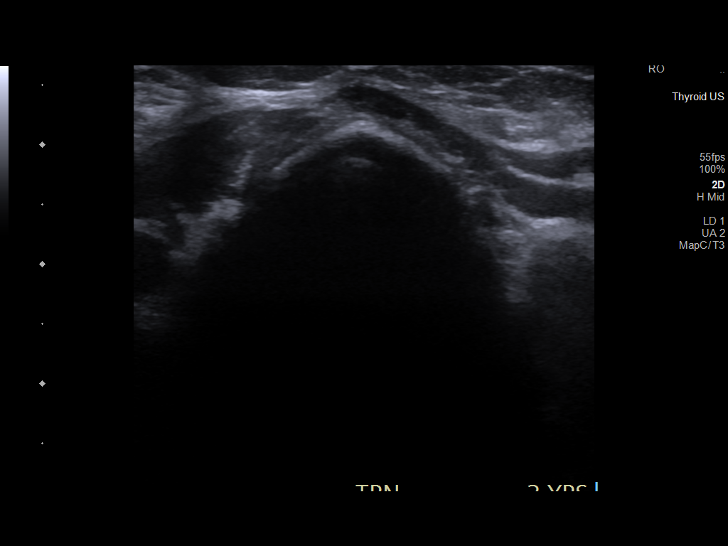
[im 2/17]
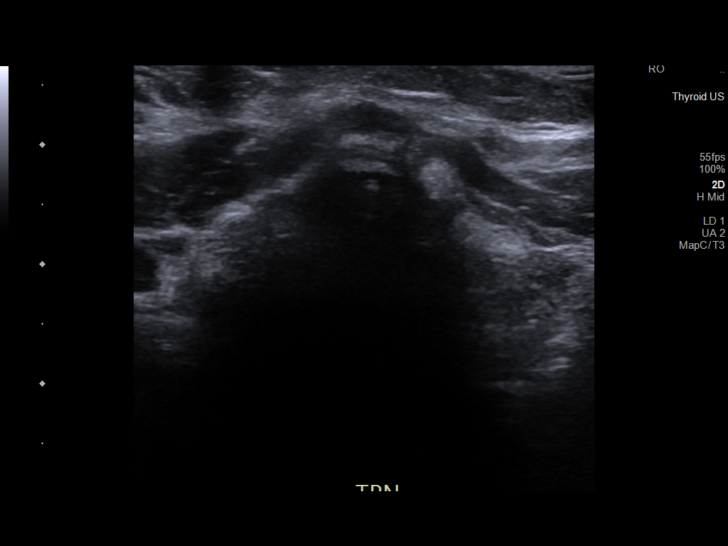
[im 4/17]
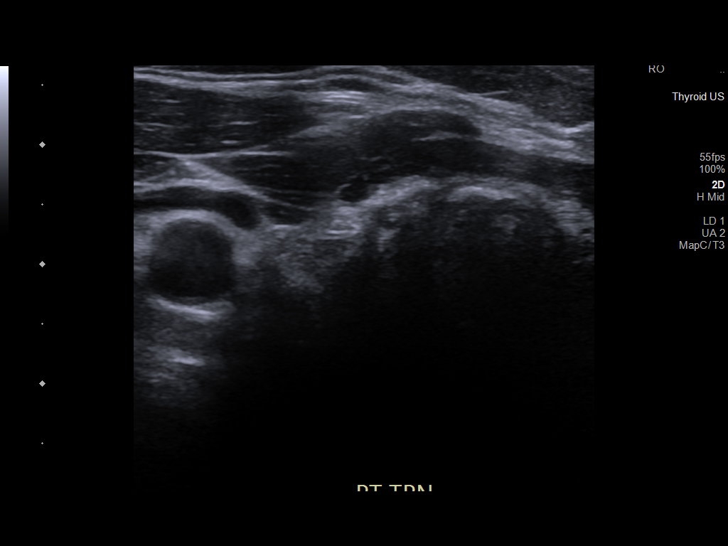
[im 5/17]
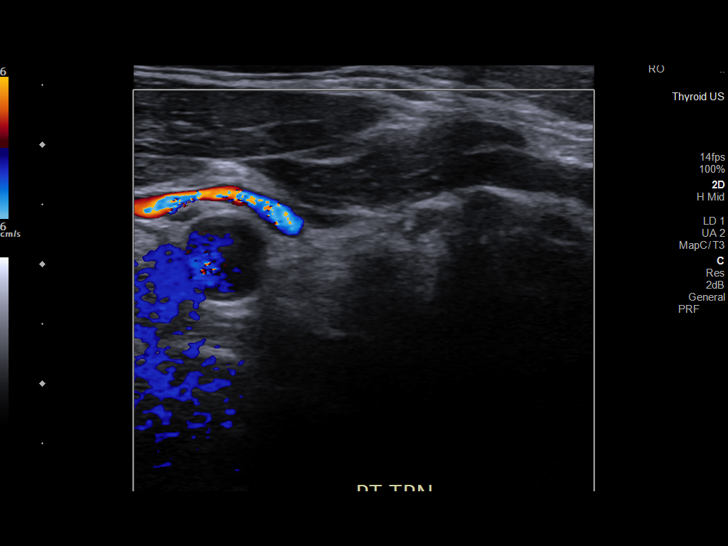
[im 6/17]
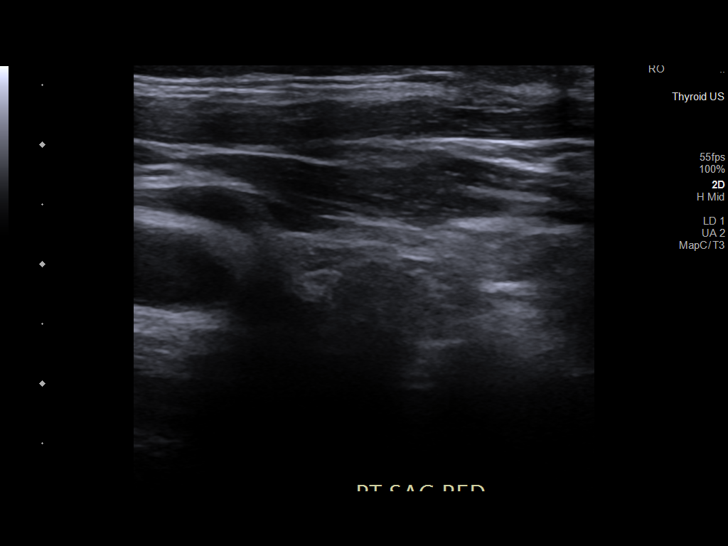
[im 7/17]
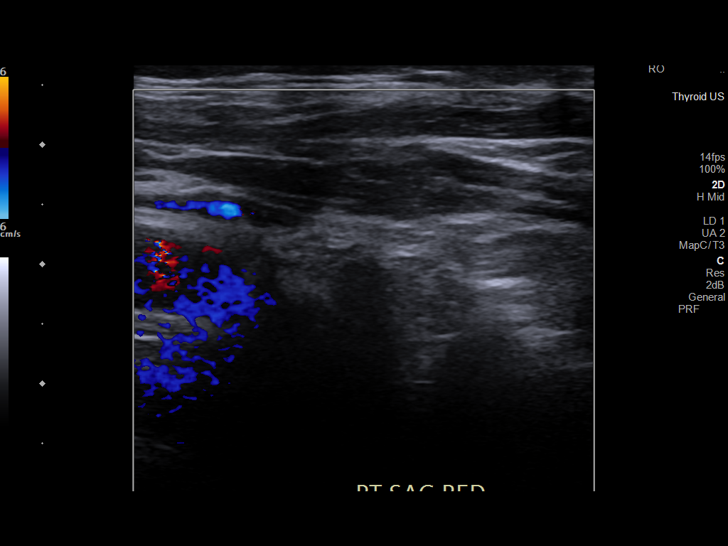
[im 8/17]
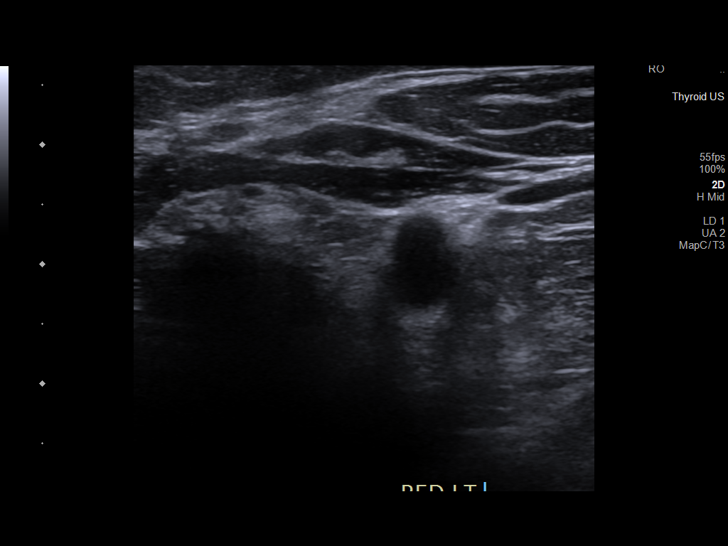
[im 10/17]
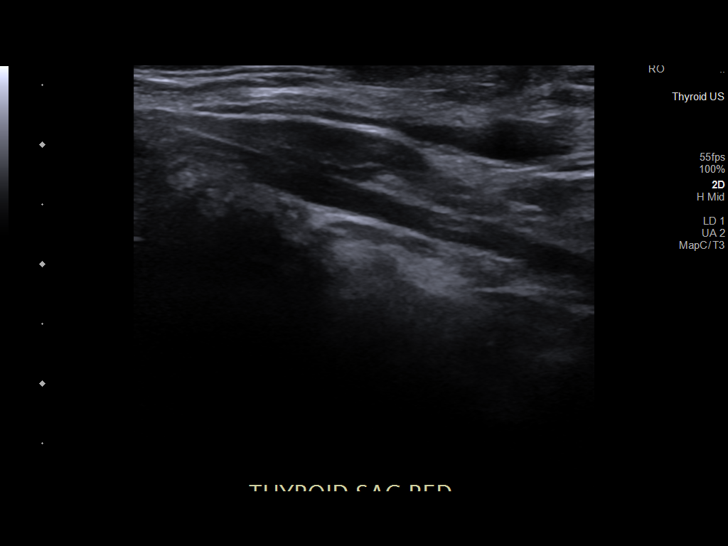
[im 11/17]
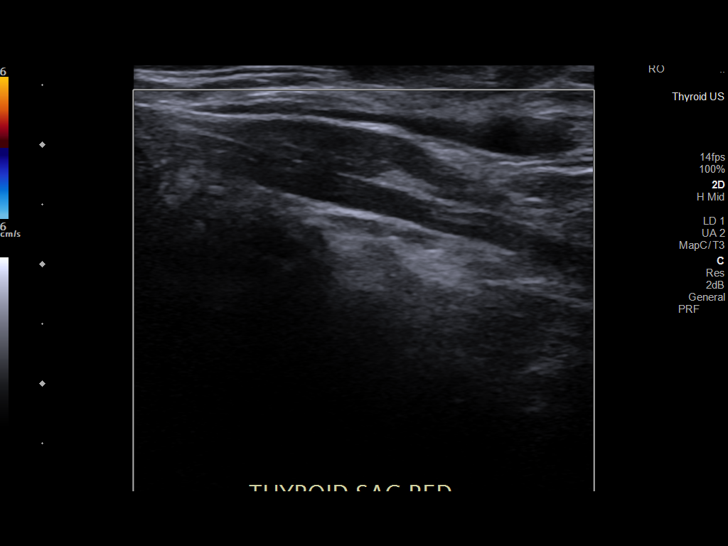
[im 12/17]
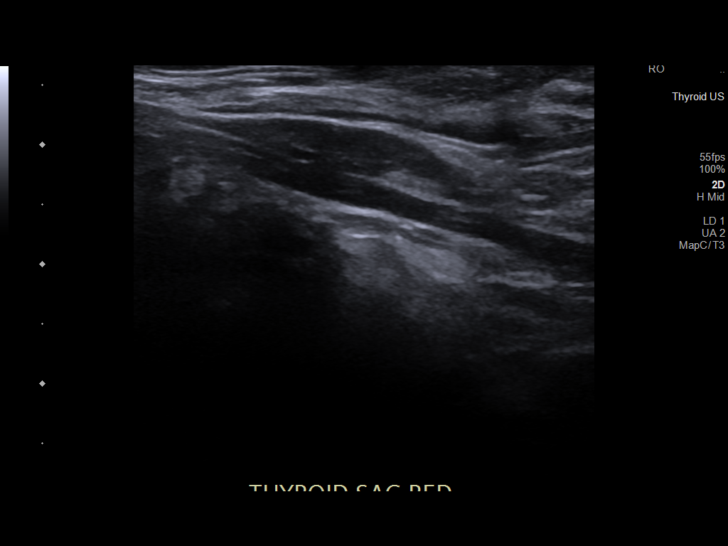
[im 13/17]
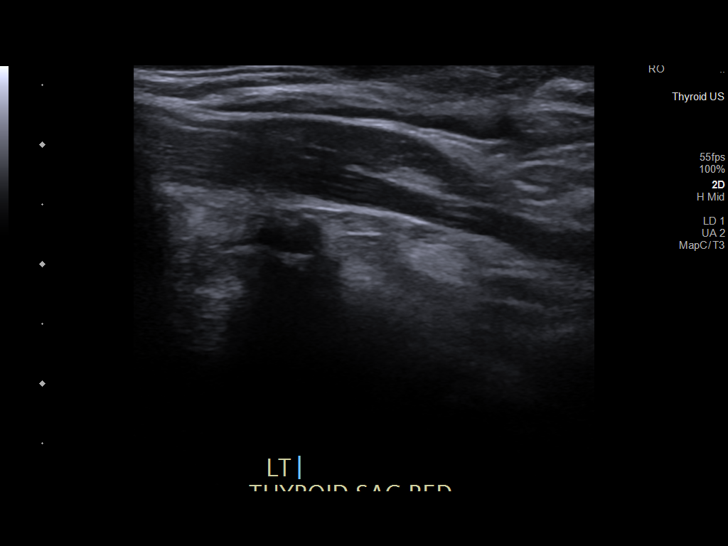
[im 14/17]
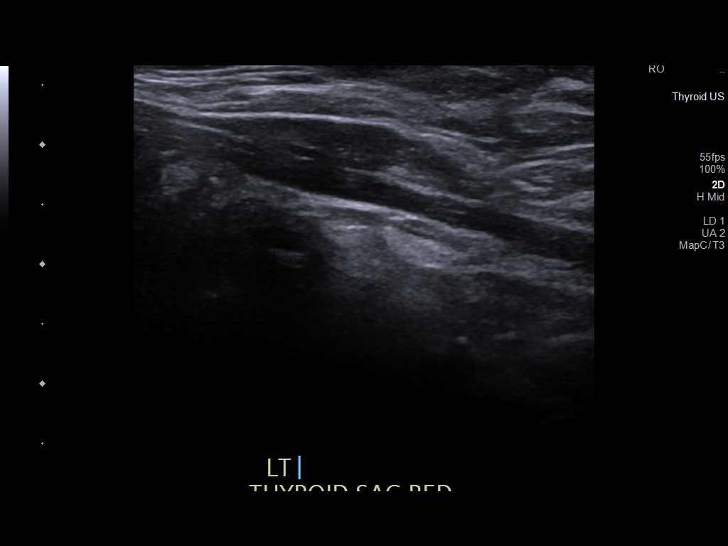
[im 16/17]
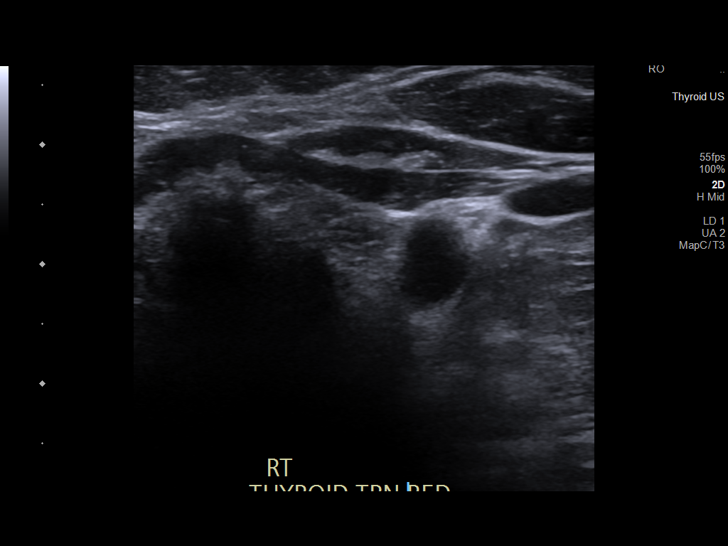
[im 17/17]
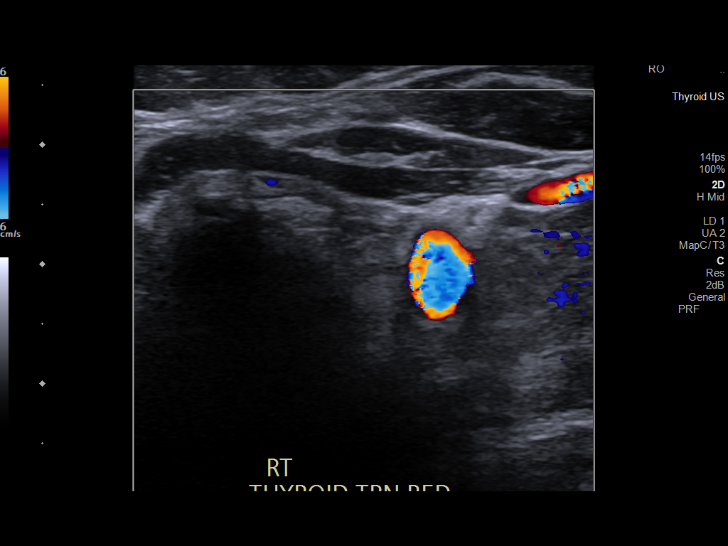

[14 of 17 positions shown; findings below may reference images not displayed]

FINDINGS: Surgical changes of total thyroidectomy.

No measurable residual thyroid tissue identified on the current
study.

No adenopathy.

_________________________________________________________
IMPRESSION: Post surgical changes of total thyroidectomy, with unremarkable
appearance of the surgical thyroid bed.

## 2022-02-27 NOTE — Telephone Encounter (Signed)
Pt said Dean Hurley called and moved his ultrasound from July to Sept and he said he feels he does not need to wait that long because he is having issues, he feels like he is going to pass out as well. He wants to know if this can be switched back?

## 2022-02-28 NOTE — Telephone Encounter (Signed)
Called pt and gave him the # to central scheduling. I told him they will be calling him back but he can call if he wants to go ahead and sched himself.

## 2022-03-06 LAB — HEPATIC FUNCTION PANEL
ALT: 43 U/L — AB (ref 10–40)
AST: 21 (ref 14–40)
Alkaline Phosphatase: 82 (ref 25–125)
Bilirubin, Total: 0.4

## 2022-03-06 LAB — BASIC METABOLIC PANEL
BUN: 27 — AB (ref 4–21)
CO2: 25 — AB (ref 13–22)
Chloride: 108 (ref 99–108)
Creatinine: 1.7 — AB (ref 0.6–1.3)
Glucose: 284
Potassium: 5 mEq/L (ref 3.5–5.1)
Sodium: 139 (ref 137–147)

## 2022-03-06 LAB — LIPID PANEL
Cholesterol: 206 — AB (ref 0–200)
HDL: 28 — AB (ref 35–70)
Triglycerides: 497 — AB (ref 40–160)

## 2022-03-06 LAB — COMPREHENSIVE METABOLIC PANEL
Albumin: 4 (ref 3.5–5.0)
Calcium: 9.4 (ref 8.7–10.7)

## 2022-03-06 LAB — VITAMIN B12: Vitamin B-12: 391

## 2022-03-08 ENCOUNTER — Other Ambulatory Visit: Payer: Self-pay | Admitting: "Endocrinology

## 2022-03-08 ENCOUNTER — Ambulatory Visit (HOSPITAL_COMMUNITY)
Admission: RE | Admit: 2022-03-08 | Discharge: 2022-03-08 | Disposition: A | Discharge status: Home / Self Care | Payer: Medicare Other | Source: Ambulatory Visit | Attending: "Endocrinology | Admitting: "Endocrinology

## 2022-03-08 DIAGNOSIS — Z794 Long term (current) use of insulin: Secondary | ICD-10-CM | POA: Diagnosis present

## 2022-03-08 DIAGNOSIS — E1169 Type 2 diabetes mellitus with other specified complication: Secondary | ICD-10-CM | POA: Insufficient documentation

## 2022-03-11 ENCOUNTER — Telehealth: Payer: Self-pay | Admitting: "Endocrinology

## 2022-03-11 DIAGNOSIS — E118 Type 2 diabetes mellitus with unspecified complications: Secondary | ICD-10-CM

## 2022-03-11 DIAGNOSIS — E1169 Type 2 diabetes mellitus with other specified complication: Secondary | ICD-10-CM

## 2022-03-11 MED ORDER — LEVEMIR FLEXPEN 100 UNIT/ML ~~LOC~~ SOPN: 50.0000 [IU] | PEN_INJECTOR | Freq: Every day | SUBCUTANEOUS | 0 refills | Status: DC

## 2022-03-11 MED ORDER — INSULIN LISPRO (1 UNIT DIAL) 100 UNIT/ML (KWIKPEN): 10.0000 [IU] | PEN_INJECTOR | Freq: Three times a day (TID) | SUBCUTANEOUS | 0 refills | Status: DC

## 2022-03-11 NOTE — Telephone Encounter (Signed)
Pt requesting refill on his Humalog and Levemir.

## 2022-03-11 NOTE — Telephone Encounter (Signed)
Rx refills sent to [facility] on [address].

## 2022-03-12 ENCOUNTER — Other Ambulatory Visit: Payer: Self-pay

## 2022-03-12 MED ORDER — METFORMIN HCL 500 MG PO TABS: ORAL_TABLET | ORAL | 0 refills | Status: DC

## 2022-03-12 NOTE — Telephone Encounter (Signed)
Sent prescription again in case this did not go through yesterday.

## 2022-03-12 NOTE — Telephone Encounter (Signed)
Rx refill resent by K.French,LPN.

## 2022-03-12 NOTE — Telephone Encounter (Signed)
Did his metformin go through? Can you look at this?

## 2022-03-25 ENCOUNTER — Telehealth: Payer: Self-pay | Admitting: "Endocrinology

## 2022-03-25 NOTE — Telephone Encounter (Signed)
New message    Asking for a call back to discuss his Kidney.

## 2022-03-25 NOTE — Telephone Encounter (Signed)
Tried to call pt but did not receive an answer and was unable to leave a message. 

## 2022-03-27 ENCOUNTER — Encounter: Payer: Self-pay | Admitting: "Endocrinology

## 2022-03-27 ENCOUNTER — Ambulatory Visit (INDEPENDENT_AMBULATORY_CARE_PROVIDER_SITE_OTHER): Payer: Medicare Other | Admitting: "Endocrinology

## 2022-03-27 VITALS — BP 108/64 | HR 64 | Ht 67.0 in | Wt 199.6 lb

## 2022-03-27 DIAGNOSIS — I10 Essential (primary) hypertension: Secondary | ICD-10-CM | POA: Diagnosis not present

## 2022-03-27 DIAGNOSIS — E1169 Type 2 diabetes mellitus with other specified complication: Secondary | ICD-10-CM

## 2022-03-27 DIAGNOSIS — E890 Postprocedural hypothyroidism: Secondary | ICD-10-CM

## 2022-03-27 DIAGNOSIS — Z794 Long term (current) use of insulin: Secondary | ICD-10-CM

## 2022-03-27 DIAGNOSIS — Z91199 Patient's noncompliance with other medical treatment and regimen due to unspecified reason: Secondary | ICD-10-CM

## 2022-03-27 DIAGNOSIS — E782 Mixed hyperlipidemia: Secondary | ICD-10-CM

## 2022-03-27 DIAGNOSIS — C73 Malignant neoplasm of thyroid gland: Secondary | ICD-10-CM

## 2022-03-27 DIAGNOSIS — Z789 Other specified health status: Secondary | ICD-10-CM

## 2022-03-27 MED ORDER — LEVEMIR FLEXPEN 100 UNIT/ML ~~LOC~~ SOPN: 60.0000 [IU] | PEN_INJECTOR | Freq: Every day | SUBCUTANEOUS | 2 refills | Status: DC

## 2022-03-27 MED ORDER — INSULIN LISPRO (1 UNIT DIAL) 100 UNIT/ML (KWIKPEN): 12.0000 [IU] | PEN_INJECTOR | Freq: Three times a day (TID) | SUBCUTANEOUS | 0 refills | Status: DC

## 2022-03-27 MED ORDER — LEVOTHYROXINE SODIUM 150 MCG PO TABS
150.0000 ug | ORAL_TABLET | Freq: Every day | ORAL | 1 refills | Status: DC
Start: 2022-03-27 — End: 2022-10-08

## 2022-03-27 NOTE — Progress Notes (Signed)
03/27/2022  Endocrinology follow-up note   Subjective:    Patient ID: Dean Hurley, male    DOB: 04-Jul-1951,   Chief complaint  Patient is here to follow-up for 1)  History of thyroid malignancy 2) postsurgical hypothyroidism 3)  Uncontrolled type 2 diabetes  4) hyperlipidemia 5) hypertension   Past Medical History:  Diagnosis Date   Cancer ([place])    Diabetes mellitus without complication ([place])    Hypertension    Thyroid cancer ([place])    Past Surgical History:  Procedure Laterality Date   BACK SURGERY     THYROID SURGERY     Thyroidectomy   TRANSURETHRAL RESECTION OF PROSTATE     Social History   Socioeconomic History   Marital status: Married    Spouse name: Not on file   Number of children: Not on file   Years of education: Not on file   Highest education level: Not on file  Occupational History   Not on file  Tobacco Use   Smoking status: Former    Types: Cigarettes   Smokeless tobacco: Never  Vaping Use   Vaping Use: Never used  Substance and Sexual Activity   Alcohol use: No    Alcohol/week: 0.0 standard drinks of alcohol   Drug use: No   Sexual activity: Not on file  Other Topics Concern   Not on file  Social History Narrative   Not on file   Social Determinants of Health   Financial Resource Strain: Not on file  Food Insecurity: Not on file  Transportation Needs: Not on file  Physical Activity: Not on file  Stress: Not on file  Social Connections: Not on file   Outpatient Encounter Medications as of 03/27/2022  Medication Sig   alum & mag hydroxide-simeth (MAALOX/MYLANTA) 200-200-20 MG/5ML suspension Take 20 mLs by mouth every 6 (six) hours as needed for heartburn.   B-D ULTRAFINE III SHORT PEN 31G X 8 MM MISC USE WITH INSULIN FOUR TIMES A DAY   Coenzyme Q10 (COQ10 PO) Take 1 tablet by mouth daily in the afternoon.   Continuous Blood Gluc Receiver (FREESTYLE LIBRE 2 READER) DEVI Use as directed to check glucose    Continuous Blood Gluc Receiver (FREESTYLE LIBRE 2 READER) DEVI As directed   Continuous Blood Gluc Sensor (FREESTYLE LIBRE 2 SENSOR) MISC Change sensor every 14 days   Continuous Blood Gluc Sensor (FREESTYLE LIBRE 2 SENSOR) MISC 1 Piece by Does not apply route every 14 (fourteen) days.   diclofenac (VOLTAREN) 75 MG EC tablet Take 75 mg by mouth 2 (two) times daily.   fenofibrate 160 MG tablet Take 160 mg by mouth daily.   insulin detemir (LEVEMIR FLEXPEN) 100 UNIT/ML FlexPen Inject 60 Units into the skin at bedtime.   insulin lispro (HUMALOG KWIKPEN) 100 UNIT/ML KwikPen Inject 12-18 Units into the skin 3 (three) times daily.   IRON, FERROUS SULFATE, PO Take 1 tablet by mouth daily in the afternoon.   levothyroxine (SYNTHROID) 150 MCG tablet Take 1 tablet (150 mcg total) by mouth daily before breakfast.   losartan-hydrochlorothiazide (HYZAAR) 50-12.5 MG tablet Take 1 tablet by mouth daily.   meclizine (ANTIVERT) 25 MG tablet Take 25 mg by mouth 3 (three) times daily as needed for dizziness.   triamcinolone cream (KENALOG) 0.1 % Apply 1 application topically daily as needed (for scaly skin).    [DISCONTINUED] glipiZIDE (GLUCOTROL XL) 10 MG 24 hr tablet Take 10  mg by mouth daily.   [DISCONTINUED] insulin detemir (LEVEMIR FLEXPEN) 100 UNIT/ML FlexPen Inject 50 Units into the skin at bedtime. (Patient taking differently: Inject 60 Units into the skin at bedtime.)   [DISCONTINUED] insulin lispro (HUMALOG KWIKPEN) 100 UNIT/ML KwikPen Inject 10-16 Units into the skin 3 (three) times daily.   [DISCONTINUED] levothyroxine (SYNTHROID) 175 MCG tablet TAKE 1 TABLET BY MOUTH EVERY DAY BEFORE BREAKFAST   [DISCONTINUED] metFORMIN (GLUCOPHAGE) 500 MG tablet TAKE 1 TABLET BY MOUTH TWICE A DAY WITH A MEAL   No facility-administered encounter medications on file as of 03/27/2022.   ALLERGIES: Allergies  Allergen Reactions   Codeine Other (See Comments)    Feels faint, flushed almost passed out almost passed  out    Contrast Media [Iodinated Contrast Media]    VACCINATION STATUS: Immunization History  Administered Date(s) Administered   Moderna Sars-Covid-2 Vaccination 10/26/2019, 11/23/2019, 06/28/2020, 02/05/2021    Diabetes   71 year old male with multiple medical problems. He underwent total thyroidectomy in [facility] on June 02, 2012.  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 1.7 cm cancer nodule on the right lobe and 0.4 cm cancer nodule on the left lobe. Partial invasion of capsule, margins negative. He underwent thyrogen stimulated I131  thyroid remnant ablation with negative whole body scan for distant metastasis on July 17, 2013.    May 2014 thyroid /neck ultrasound :  negative for any mass lesion.   Thyrogen stimulated WBS on 10/25/13 was c/w successful ablation of thyroid remnants with no evidence of local recurrence or metastatic disease. He has  had another thyroid ultrasound on 10/11/14 which was negative for residual thyroid tissue. On his next  surveillance thyroid ultrasound on September 6,2016 however he has had a 9 mm residual tissue on the  right thyroid bed, which grew to 12 mm in follow-up ultrasound in December 2016. He was sent to [facility] where he had his prior surgery. Fine needle  aspiration of the residual tissue did not reveal diagnostic material.  - On thyroid/neck ultrasound  on 02/01/2016 showed that the remnant thyroid tissue decreased in size to 1.0 cm, all other  findings unchanged.  - His last  Thyrogen stimulated whole-body scan on  06/21/2016 was  reported to be negative for any iodine avid metastatic thyroid cancer.  - On 06/27/2017 : His  ultrasound at an outside facility in [facility] revealed an echogenic focus measuring 1.7cm x 0.7cm x 1.1 cm ( previously 1 cm-1.2 cm in [facility] imaging on 02/01/2016) potentially representing a small amount of residual thyroid tissue versus  small lymph node. There is no identifiable tissue on the left, no identifiable isthmus.  -He pushed to get his previsit ultrasound prematurely on August 08, 2017 which showed no significant change in the thyroid remnant at 1.2 cm.  -His most recent  repeat surveillance thyroid/neck ultrasound on February 02, 2018 is unchanged showing stable 1.2 cm nodular tissue ithin the right lobectomy bed, unchanged since December 2016.   September 07, 2018: Thyrogen stimulated whole-body scan negative for any iodine avid metastatic thyroid cancer. -See his previous visit notes. - surveillance thyroid/neck ultrasound on August 20, 2019 which revealed stable 1.2 cm thyroid remnant tissue on the right thyroid bed.  No additional or new findings.   His thyroid/neck ultrasound on April 03, 2021 showed surgical changes of total thyroidectomy, no measurable residual thyroid tissue identified on the current study.  His most recent thyroid ultrasound on March 08, 2022 showed 17 mm x 6 mm x 8 mm tissue within the right surgical thyroid gait, not identified on prior ultrasound.  CT with contrast was recommended.   He denies any dysphagia, shortness of breath, nor voice change.  He is currently on levothyroxine 175 mcg p.o. daily before breakfast.  His previsit thyroid function tests are consistent with over replacement.    For his uncontrolled type 2 diabetes, managed with better glycemic profile averaging 169.  His previsit labs show worsening CKD.    His labs also show worsening lipid panel.  He did not engage with the  whole food plant-based diet recommended to him during his last visit.  His recent point-of-care A1c was 8%.   He did not document any hypoglycemia.   Review of Systems Limited as above.   Objective:    BP 108/64   Pulse 64   Ht '5\' 7"'  (1.702 m)   Wt 199 lb 9.6 oz (90.5 kg)   BMI 31.26 kg/m   Wt Readings from Last 3 Encounters:  03/27/22 199 lb 9.6 oz (90.5 kg)  02/13/22 205 lb 3.2 oz  (93.1 kg)  11/12/21 210 lb 9.6 oz (95.5 kg)    Physical Exam          Latest Ref Rng & Units 03/21/2022   10:10 AM 03/06/2022   12:00 AM 11/07/2021    2:09 PM  CMP  Glucose 70 - 99 mg/dL 174   174   BUN 8 - 27 mg/dL '28  27     21   ' Creatinine 0.76 - 1.27 mg/dL 1.43  1.7     1.20   Sodium 134 - 144 mmol/L 143  139     142   Potassium 3.5 - 5.2 mmol/L 4.6  5.0     4.6   Chloride 96 - 106 mmol/L 105  108     105   CO2 20 - 29 mmol/L '18  25     25   ' Calcium 8.6 - 10.2 mg/dL 9.7  9.4     9.8   Total Protein 6.0 - 8.5 g/dL 7.0   7.0   Total Bilirubin 0.0 - 1.2 mg/dL 0.2   0.4   Alkaline Phos 44 - 121 IU/L 73  82     77   AST 0 - 40 IU/L 23  21     38   ALT 0 - 44 IU/L 28  43     45      This result is from an external source.    Recent Results (from the past 2160 hour(s))  HgB A1c     Status: Abnormal   Collection Time: 02/13/22  9:34 AM  Result Value Ref Range   Hemoglobin A1C     HbA1c POC (<> result, manual entry)     HbA1c, POC (prediabetic range)     HbA1c, POC (controlled diabetic range) 8.0 (A) 0.0 - 7.0 %  Basic metabolic panel     Status: Abnormal   Collection Time: 03/06/22 12:00 AM  Result Value Ref Range   Glucose 284    BUN 27 (A) 4 - 21   CO2 25 (A) 13 - 22   Creatinine 1.7 (A) 0.6 - 1.3   Potassium 5.0 3.5 - 5.1 mEq/L   Sodium 139 137 - 147   Chloride 108 99 - 108  Comprehensive metabolic panel     Status: None   Collection Time: 03/06/22 12:00 AM  Result Value  Ref Range   Calcium 9.4 8.7 - 10.7   Albumin 4.0 3.5 - 5.0  Lipid panel     Status: Abnormal   Collection Time: 03/06/22 12:00 AM  Result Value Ref Range   Triglycerides 497 (A) 40 - 160   Cholesterol 206 (A) 0 - 200   HDL 28 (A) 35 - 70  Hepatic function panel     Status: Abnormal   Collection Time: 03/06/22 12:00 AM  Result Value Ref Range   Alkaline Phosphatase 82 25 - 125   ALT 43 (A) 10 - 40 U/L   AST 21 14 - 40   Bilirubin, Total 0.4   Vitamin B12     Status: None   Collection  Time: 03/06/22 12:00 AM  Result Value Ref Range   Vitamin B-12 391   Comprehensive metabolic panel     Status: Abnormal   Collection Time: 03/21/22 10:10 AM  Result Value Ref Range   Glucose 174 (H) 70 - 99 mg/dL   BUN 28 (H) 8 - 27 mg/dL   Creatinine, Ser 1.43 (H) 0.76 - 1.27 mg/dL   eGFR 53 (L) >59 mL/min/1.73   BUN/Creatinine Ratio 20 10 - 24   Sodium 143 134 - 144 mmol/L   Potassium 4.6 3.5 - 5.2 mmol/L   Chloride 105 96 - 106 mmol/L   CO2 18 (L) 20 - 29 mmol/L   Calcium 9.7 8.6 - 10.2 mg/dL   Total Protein 7.0 6.0 - 8.5 g/dL   Albumin 4.4 3.9 - 4.9 g/dL   Globulin, Total 2.6 1.5 - 4.5 g/dL   Albumin/Globulin Ratio 1.7 1.2 - 2.2   Bilirubin Total 0.2 0.0 - 1.2 mg/dL   Alkaline Phosphatase 73 44 - 121 IU/L   AST 23 0 - 40 IU/L   ALT 28 0 - 44 IU/L  Lipid panel     Status: Abnormal   Collection Time: 03/21/22 10:10 AM  Result Value Ref Range   Cholesterol, Total 192 100 - 199 mg/dL   Triglycerides 243 (H) 0 - 149 mg/dL   HDL 27 (L) >39 mg/dL   VLDL Cholesterol Cal 43 (H) 5 - 40 mg/dL   LDL Chol Calc (NIH) 122 (H) 0 - 99 mg/dL   Chol/HDL Ratio 7.1 (H) 0.0 - 5.0 ratio    Comment:                                   T. Chol/HDL Ratio                                             Men  Women                               1/2 Avg.Risk  3.4    3.3                                   Avg.Risk  5.0    4.4                                2X Avg.Risk  9.6    7.1  3X Avg.Risk 23.4   11.0   TSH     Status: Abnormal   Collection Time: 03/21/22 10:10 AM  Result Value Ref Range   TSH 0.040 (L) 0.450 - 4.500 uIU/mL  T4, free     Status: Abnormal   Collection Time: 03/21/22 10:10 AM  Result Value Ref Range   Free T4 1.98 (H) 0.82 - 1.77 ng/dL  Thyroglobulin antibody     Status: None   Collection Time: 03/21/22 10:10 AM  Result Value Ref Range   Thyroglobulin Antibody <1.0 0.0 - 0.9 IU/mL    Comment: Thyroglobulin Antibody measured by Beckman Coulter  Methodology  Thyroglobulin Level     Status: None (Preliminary result)   Collection Time: 03/21/22 10:10 AM  Result Value Ref Range   Thyroglobulin (TG-RIA) WILL FOLLOW       Thyrogen stimulated whole-body scan on 06/21/2016: IMPRESSION: Negative whole-body I 131 scan for recurrent iodine avid thyroid Cancer.  06/14/2016 thyroglobulin antibodies negative, thyroglobulin by IME less than 0.1  06/27/2017 - His previsit ultrasound at an outside facility in [facility] revealed an echogenic focus measuring 1.7cm x 0.7cm x 1.1 cm ( previously 1 cm-1.2 cm in [facility] imaging on 02/01/2016) potentially representing a small amount of residual thyroid tissue versus small lymph node. There is no identifiable tissue on the left, no identifiable isthmus.  August 08, 2018 thyroid/neck ultrasound in [facility]:   FINDINGS: Residual solid nodular isoechoic tissue in the right thyroid noted measures 1.2 x 1.0 x 1.0 cm, previously 1.0 x 0.7 x 0.8 cm. No significant interval change. No new abnormality.  Similar ultrasound findings on February 02, 2018. See above.  September 07, 2018 thyroid stimulated whole-body scan negative for iodine avid distant metastasis.  Thyroglobulin by IMA on October 08, 2018 undetectable.  Thyroid/neck surveillance ultrasound August 20, 2019 IMPRESSION: Stable soft tissue in the right thyroidectomy bed. No significant change from the previous examination. No other evidence for recurrent or residual thyroid tissue.  Thyroid ultrasound on March 08, 2022 IMPRESSION: Ultrasound survey of the neck demonstrates tissue within the right surgical thyroid bed measuring 17 mm x 6 mm x 8 mm, not identified on the prior ultrasound. Recurrent tissue growth not excluded, and correlation with thyroid lab values may be useful, as well as consideration of imaging such as contrast-enhanced neck CT and/or nuclear medicine study.   Assessment & Plan:    1.  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 on July 17, 2012, thyrogen stimulated RAI thyroid remnant ablation given his BRAF positive presentation.  The post therapy scan was negative for distant metastasis. His May 2014 thyroid ultrasound is negative for remnant tissue.  Subsequent surveillance thyrogen stimulated WBS on 10/25/13 was c/w successful ablation of thyroid remnants, with no evidence of local recurrence nor metastatic disease.  His repeat thyroid ultrasound on 10/11/14 showed no residual thyroid tissue. However, on his last surveillance thyroid/neck ultrasound she was found to have a new 39m residual/recurrent soft tissue nodule in the right  thyroidectomy bed. On subsequent surveillance thyroid ultrasound this tissue was found to be larger at 12 mm. Attempt to biopsy this up to [facility] was not diagnostic.   - On thyroid/neck ultrasound before this visit on 02/01/2016 showed that the remnant thyroid tissue decreased in size to 1.0 cm, all other  findings unchanged.  There  was no sign of residual thyroid tissue on whole-body scan performed 2015.  She is thyroglobulin by IMA was less than 0.1 associated with negative thyroglobulin antibodies on 06/14/2016.  - Thyrogen stimulated whole-body scan on 06/21/2016 was negative for iodine avid metastatic thyroid cancer.   - thyroid/neck ultrasound on 06/27/2017 showed persistence of possibly thyroid tissue in the right thyroid lobe bed- this was done within a different facility than his prior imaging studies-subject to variations of measurements.  -  thyroid/neck ultrasound in [facility] in June 2019- shows stable finding of 1.2 cm nodular area within the right lobectomy bed. No intervention at this time. His  whole-body scan after Thyrogen stimulation was negative for distant metastasis on September 07, 2018.     Surveillance thyroid/neck ultrasound on January 2021 Stable soft tissue in the right thyroidectomy bed. No significant change from the previous examination. No other evidence for recurrent or residual thyroid tissue. -His August 2022  thyroid/neck ultrasound was unremarkable.  He is currently 9 years out from his original surgery.    Previsit thyroid ultrasound on March 08, 2022 showed that his previously documented tissue on the right thyroid bed is still present measuring 17 mm.  Per radiology recommendation, he will be considered for contrast enhanced CT scan of neck/thyroid.  He has not had reaction to contrast previously, will need premedication with steroids and Benadryl.  2. Postsurgical hypothyroidism -His previsit labs are consistent with over replacement.  He is advised to lower Synthroid to 150 mcg P.o. daily before breakfast.    - We discussed about the correct intake of his thyroid hormone, on empty stomach at fasting, with water, separated by at least 30 minutes from breakfast and other medications,  and separated by more than 4 hours from calcium, iron, multivitamins, acid reflux medications (PPIs). -Patient is made aware of the fact that thyroid hormone replacement is needed for life, dose to be adjusted by periodic monitoring of thyroid function tests.   3. Uncontrolled type 2 diabetes mellitus with complication, with long-term current use of insulin ([place]) - He has engaged better for his diabetes management this time.  His freestyle libre device AGP report shows 67% time range, 27 level 1 hyperglycemia, 6% level 2 hyperglycemia.  No hypoglycemia.  His average blood glucose is 169 for the last 14 days. -His recent  point-of-care A1c is 8%, significantly improving from 9.1% during his last visit.  He may benefit from an upgraded freestyle libre 2. -He is advised to continue Levemir 60 units nightly, increase NovoLog to 12 -18  units 3 times daily AC for Premeal blood glucose  readings above 90 mg per DL. He remains  a good candidate for lifestyle medicine. - he acknowledges that there is a room for improvement in his food and drink choices. - Suggestion is made for him to avoid simple carbohydrates  from his diet including Cakes, Sweet Desserts, Ice Cream, Soda (diet and regular), Sweet Tea, Candies, Chips, Cookies, Store Bought Juices, Alcohol , Artificial Sweeteners,  Coffee Creamer, and "Sugar-free" Products, Lemonade. This will help patient to have more stable blood glucose profile and potentially avoid unintended weight gain.  The following Lifestyle Medicine recommendations according to [place]  [facility]) were discussed and and offered to patient and he  agrees to start the journey:  A. Whole Foods, Plant-Based Nutrition comprising of fruits and vegetables, plant-based proteins, whole-grain carbohydrates was discussed in detail with the patient.   A list for source of  those nutrients were also provided to the patient.  Patient will use only water or unsweetened tea for hydration. B.  The need to stay away from risky substances including alcohol, smoking; obtaining 7 to 9 hours of restorative sleep, at least 150 minutes of moderate intensity exercise weekly, the importance of healthy social connections,  and stress management techniques were discussed. C.  A full color page of  Calorie density of various food groups per pound showing examples of each food groups was provided to the patient.   -He is advised to continue metformin due to worsening CKD.  He was advised to discontinue glipizide during his last visit.    4. Hyperlipidemia:  His lipid panel still abnormal with LDL 139.  He did not get appropriate coverage for Repatha.  He does not tolerate statins.  Once again, whole food plant-based diet discussed recommended to him to manage dyslipidemia.  5. Essential hypertension: -His blood pressure is controlled to target.  He is  advised to continue on his current medications including lisinopril/HCTZ 20/12.5 mg p.o. daily at breakfast.     His ABI is normal today August 30, 2020, his neck study is in January 2027, or sooner if needed.  I advised patient to maintain close follow up with his PCP for primary care needs.  I spent 41 minutes in the care of the patient today including review of labs from [facility], Lipids, Thyroid Function, Hematology (current and previous including abstractions from other facilities); face-to-face time discussing  his blood glucose readings/logs, discussing hypoglycemia and hyperglycemia episodes and symptoms, medications doses, his options of short and long term treatment based on the latest standards of care / guidelines;  discussion about incorporating lifestyle medicine;  and documenting the encounter. Risk reduction counseling performed per USPSTF guidelines to reduce  obesity and cardiovascular risk factors.     Please refer to Patient Instructions for Blood Glucose Monitoring and Insulin/Medications Dosing Guide"  in media tab for additional information. Please  also refer to " Patient Self Inventory" in the Media  tab for reviewed elements of pertinent patient history.  Stevie Kern participated in the discussions, expressed understanding, and voiced agreement with the above plans.  All questions were answered to his satisfaction. he is encouraged to contact clinic should he have any questions or concerns prior to his return visit.   Follow up plan: Return for Keep Reg. Appt. with CT Scan and CGM.  Glade Lloyd, MD Phone: [phone]  Fax: [phone]   This note was partially dictated with voice recognition software. Similar sounding words can be transcribed inadequately or may not  be corrected upon review.  03/27/2022, 3:28 PM

## 2022-03-27 NOTE — Patient Instructions (Signed)
                                     Advice for Weight Management  -For most of us the best way to lose weight is by diet management. Generally speaking, diet management means consuming less calories intentionally which over time brings about progressive weight loss.  This can be achieved more effectively by avoiding ultra processed carbohydrates, processed meats, unhealthy fats.    It is critically important to know your numbers: how much calorie you are consuming and how much calorie you need. More importantly, our carbohydrates sources should be unprocessed naturally occurring  complex starch food items.  It is always important to balance nutrition also by  appropriate intake of proteins (mainly plant-based), healthy fats/oils, plenty of fruits and vegetables.   -The American College of Lifestyle Medicine (ACL M) recommends nutrition derived mostly from Whole Food, Plant Predominant Sources example an apple instead of applesauce or apple pie. Eat Plenty of vegetables, Mushrooms, fruits, Legumes, Whole Grains, Nuts, seeds in lieu of processed meats, processed snacks/pastries red meat, poultry, eggs.  Use only water or unsweetened tea for hydration.  The College also recommends the need to stay away from risky substances including alcohol, smoking; obtaining 7-9 hours of restorative sleep, at least 150 minutes of moderate intensity exercise weekly, importance of healthy social connections, and being mindful of stress and seek help when it is overwhelming.    -Sticking to a routine mealtime to eat 3 meals a day and avoiding unnecessary snacks is shown to have a big role in weight control. Under normal circumstances, the only time we burn stored energy is when we are hungry, so allow  some hunger to take place- hunger means no food between appropriate meal times, only water.  It is not advisable to starve.   -It is better to avoid simple carbohydrates including:  Cakes, Sweet Desserts, Ice Cream, Soda (diet and regular), Sweet Tea, Candies, Chips, Cookies, Store Bought Juices, Alcohol in Excess of  1-2 drinks a day, Lemonade,  Artificial Sweeteners, Doughnuts, Coffee Creamers, "Sugar-free" Products, etc, etc.  This is not a complete list.....    -Consulting with certified diabetes educators is proven to provide you with the most accurate and current information on diet.  Also, you may be  interested in discussing diet options/exchanges , we can schedule a visit with Dean Hurley, RDN, CDE for individualized nutrition education.  -Exercise: If you are able: 30 -60 minutes a day ,4 days a week, or 150 minutes of moderate intensity exercise weekly.    The longer the better if tolerated.  Combine stretch, strength, and aerobic activities.  If you were told in the past that you have high risk for cardiovascular diseases, or if you are currently symptomatic, you may seek evaluation by your heart doctor prior to initiating moderate to intense exercise programs.                                  Additional Care Considerations for Diabetes/Prediabetes   -Diabetes  is a chronic disease.  The most important care consideration is regular follow-up with your diabetes care provider with the goal being avoiding or delaying its complications and to take advantage of advances in medications and technology.  If appropriate actions are taken early enough, type 2 diabetes can even be   reversed.  Seek information from the right source.  - Whole Food, Plant Predominant Nutrition is highly recommended: Eat Plenty of vegetables, Mushrooms, fruits, Legumes, Whole Grains, Nuts, seeds in lieu of processed meats, processed snacks/pastries red meat, poultry, eggs as recommended by American College of  Lifestyle Medicine (ACLM).  -Type 2 diabetes is known to coexist with other important comorbidities such as high blood pressure and high cholesterol.  It is critical to control not only the  diabetes but also the high blood pressure and high cholesterol to minimize and delay the risk of complications including coronary artery disease, stroke, amputations, blindness, etc.  The good news is that this diet recommendation for type 2 diabetes is also very helpful for managing high cholesterol and high blood blood pressure.  - Studies showed that people with diabetes will benefit from a class of medications known as ACE inhibitors and statins.  Unless there are specific reasons not to be on these medications, the standard of care is to consider getting one from these groups of medications at an optimal doses.  These medications are generally considered safe and proven to help protect the heart and the kidneys.    - People with diabetes are encouraged to initiate and maintain regular follow-up with eye doctors, foot doctors, dentists , and if necessary heart and kidney doctors.     - It is highly recommended that people with diabetes quit smoking or stay away from smoking, and get yearly  flu vaccine and pneumonia vaccine at least every 5 years.  See above for additional recommendations on exercise, sleep, stress management , and healthy social connections.      

## 2022-03-28 LAB — COMPREHENSIVE METABOLIC PANEL
ALT: 28 IU/L (ref 0–44)
AST: 23 IU/L (ref 0–40)
Albumin/Globulin Ratio: 1.7 (ref 1.2–2.2)
Albumin: 4.4 g/dL (ref 3.9–4.9)
Alkaline Phosphatase: 73 IU/L (ref 44–121)
BUN/Creatinine Ratio: 20 (ref 10–24)
BUN: 28 mg/dL — ABNORMAL HIGH (ref 8–27)
Bilirubin Total: 0.2 mg/dL (ref 0.0–1.2)
CO2: 18 mmol/L — ABNORMAL LOW (ref 20–29)
Calcium: 9.7 mg/dL (ref 8.6–10.2)
Chloride: 105 mmol/L (ref 96–106)
Creatinine, Ser: 1.43 mg/dL — ABNORMAL HIGH (ref 0.76–1.27)
Globulin, Total: 2.6 g/dL (ref 1.5–4.5)
Glucose: 174 mg/dL — ABNORMAL HIGH (ref 70–99)
Potassium: 4.6 mmol/L (ref 3.5–5.2)
Sodium: 143 mmol/L (ref 134–144)
Total Protein: 7 g/dL (ref 6.0–8.5)
eGFR: 53 mL/min/{1.73_m2} — ABNORMAL LOW (ref >59)

## 2022-03-28 LAB — LIPID PANEL
Chol/HDL Ratio: 7.1 ratio — ABNORMAL HIGH (ref 0.0–5.0)
Cholesterol, Total: 192 mg/dL (ref 100–199)
HDL: 27 mg/dL — ABNORMAL LOW (ref >39)
LDL Chol Calc (NIH): 122 mg/dL — ABNORMAL HIGH (ref 0–99)
Triglycerides: 243 mg/dL — ABNORMAL HIGH (ref 0–149)
VLDL Cholesterol Cal: 43 mg/dL — ABNORMAL HIGH (ref 5–40)

## 2022-03-28 LAB — T4, FREE: Free T4: 1.98 ng/dL — ABNORMAL HIGH (ref 0.82–1.77)

## 2022-03-28 LAB — THYROGLOBULIN ANTIBODY: Thyroglobulin Antibody: <1 IU/mL (ref 0.0–0.9)

## 2022-03-28 LAB — TSH: TSH: 0.04 u[IU]/mL — ABNORMAL LOW (ref 0.450–4.500)

## 2022-03-28 LAB — THYROGLOBULIN LEVEL: Thyroglobulin (TG-RIA): <2 ng/mL

## 2022-05-08 ENCOUNTER — Ambulatory Visit (HOSPITAL_COMMUNITY): Payer: Medicare Other

## 2022-05-15 ENCOUNTER — Telehealth: Payer: Self-pay | Admitting: "Endocrinology

## 2022-05-15 DIAGNOSIS — E890 Postprocedural hypothyroidism: Secondary | ICD-10-CM

## 2022-05-15 DIAGNOSIS — E782 Mixed hyperlipidemia: Secondary | ICD-10-CM

## 2022-05-15 DIAGNOSIS — Z794 Long term (current) use of insulin: Secondary | ICD-10-CM

## 2022-05-15 NOTE — Telephone Encounter (Signed)
New message  Patient needs new lab order entered into epic.   Patient is asking for a call back to discuss medication issues

## 2022-05-16 ENCOUNTER — Ambulatory Visit: Payer: Medicare Other | Admitting: "Endocrinology

## 2022-05-16 NOTE — Telephone Encounter (Signed)
Orders updated and sent to [facility]. Tried to call pt but did not receive an answer and was unable to leave a message.

## 2022-05-23 ENCOUNTER — Ambulatory Visit: Payer: Medicare Other | Admitting: "Endocrinology

## 2022-05-27 ENCOUNTER — Telehealth: Payer: Self-pay | Admitting: "Endocrinology

## 2022-05-27 NOTE — Telephone Encounter (Signed)
Patient said he called over there to sch and the order was closed. Patient was aware of that but they told him that he would have contrast and needed an RX for benadryl to take 11 hours prior and it could not be OTC

## 2022-05-27 NOTE — Telephone Encounter (Signed)
Pt called and said the reason he has not been able to schedule his CT was because he changed his phone number. I have updated this, could you put in a new order for all the things he needs for an appt

## 2022-05-27 NOTE — Telephone Encounter (Signed)
I'll wait to place orders until you get a confirmation.

## 2022-05-27 NOTE — Telephone Encounter (Signed)
I received a call from [facility] asking for our fax number to get records because pt has made an appt with this office on Friday. I tried to call pt to verify which office he would be establishing with. No answer, left VM

## 2022-05-28 ENCOUNTER — Ambulatory Visit: Payer: Medicare Other | Admitting: "Endocrinology

## 2022-05-30 NOTE — Telephone Encounter (Signed)
Spoke with a lady at [facility] and they said he is just going there to have his CT looked at it.  You can send the order to [facility]

## 2022-06-03 NOTE — Telephone Encounter (Signed)
Received a 3rd request for medical records from [place]. I have sent this request for the 3rd time to medical records  Tried to call pt home phone no answer, left VM on cell

## 2022-06-04 ENCOUNTER — Telehealth: Payer: Self-pay | Admitting: "Endocrinology

## 2022-06-04 NOTE — Telephone Encounter (Signed)
Dr Dorris Fetch Patient said [facility] could not do his CT until December. Do you want him to wait this long? Please Advise

## 2022-06-08 ENCOUNTER — Other Ambulatory Visit: Payer: Self-pay | Admitting: "Endocrinology

## 2022-06-12 LAB — COMPREHENSIVE METABOLIC PANEL
ALT: 25 IU/L (ref 0–44)
AST: 21 IU/L (ref 0–40)
Albumin/Globulin Ratio: 1.7 (ref 1.2–2.2)
Albumin: 4.4 g/dL (ref 3.9–4.9)
Alkaline Phosphatase: 86 IU/L (ref 44–121)
BUN/Creatinine Ratio: 15 (ref 10–24)
BUN: 20 mg/dL (ref 8–27)
Bilirubin Total: 0.2 mg/dL (ref 0.0–1.2)
CO2: 22 mmol/L (ref 20–29)
Calcium: 9.8 mg/dL (ref 8.6–10.2)
Chloride: 102 mmol/L (ref 96–106)
Creatinine, Ser: 1.34 mg/dL — ABNORMAL HIGH (ref 0.76–1.27)
Globulin, Total: 2.6 g/dL (ref 1.5–4.5)
Glucose: 237 mg/dL — ABNORMAL HIGH (ref 70–99)
Potassium: 4.6 mmol/L (ref 3.5–5.2)
Sodium: 138 mmol/L (ref 134–144)
Total Protein: 7 g/dL (ref 6.0–8.5)
eGFR: 57 mL/min/{1.73_m2} — ABNORMAL LOW (ref >59)

## 2022-06-12 LAB — LIPID PANEL
Chol/HDL Ratio: 6.5 ratio — ABNORMAL HIGH (ref 0.0–5.0)
Cholesterol, Total: 240 mg/dL — ABNORMAL HIGH (ref 100–199)
HDL: 37 mg/dL — ABNORMAL LOW (ref >39)
LDL Chol Calc (NIH): 155 mg/dL — ABNORMAL HIGH (ref 0–99)
Triglycerides: 260 mg/dL — ABNORMAL HIGH (ref 0–149)
VLDL Cholesterol Cal: 48 mg/dL — ABNORMAL HIGH (ref 5–40)

## 2022-06-12 LAB — THYROGLOBULIN LEVEL: Thyroglobulin (TG-RIA): <2 ng/mL

## 2022-06-12 LAB — TSH: TSH: 1.43 u[IU]/mL (ref 0.450–4.500)

## 2022-06-12 LAB — T4, FREE: Free T4: 1.48 ng/dL (ref 0.82–1.77)

## 2022-06-12 LAB — THYROGLOBULIN ANTIBODY: Thyroglobulin Antibody: <1 IU/mL (ref 0.0–0.9)

## 2022-07-12 ENCOUNTER — Ambulatory Visit (HOSPITAL_COMMUNITY): Payer: Medicare Other

## 2022-07-15 ENCOUNTER — Telehealth: Payer: Self-pay | Admitting: "Endocrinology

## 2022-07-15 NOTE — Telephone Encounter (Signed)
Pt said that he is suppose to have a CT scan but they are saying he needs 50 mg of prednisone and 50 mg of benadryl. He has to have a RX for this before they will reschedule.  [facility]

## 2022-07-17 NOTE — Telephone Encounter (Signed)
Left a message requesting pt return call to the office. ?

## 2022-07-17 NOTE — Telephone Encounter (Signed)
Pt is asking about message below

## 2022-07-18 ENCOUNTER — Ambulatory Visit: Payer: Medicare Other | Admitting: "Endocrinology

## 2022-07-18 NOTE — Telephone Encounter (Signed)
Left a message requesting pt return call to the office. ?

## 2022-07-22 NOTE — Telephone Encounter (Signed)
F/u  The patient returning call back to the nurse

## 2022-07-23 ENCOUNTER — Other Ambulatory Visit: Payer: Self-pay | Admitting: "Endocrinology

## 2022-07-23 DIAGNOSIS — E1169 Type 2 diabetes mellitus with other specified complication: Secondary | ICD-10-CM

## 2022-07-23 NOTE — Telephone Encounter (Signed)
Left a message requesting pt to return call to office.

## 2022-07-24 NOTE — Telephone Encounter (Signed)
Left a message requesting pt return call to the office. ?

## 2022-07-26 NOTE — Telephone Encounter (Signed)
Left a message requesting pt return call to the office. ?

## 2022-08-12 HISTORY — PX: BLADDER SURGERY: SHX569

## 2022-08-15 ENCOUNTER — Other Ambulatory Visit: Payer: Self-pay | Admitting: "Endocrinology

## 2022-08-15 ENCOUNTER — Telehealth: Payer: Self-pay | Admitting: "Endocrinology

## 2022-08-15 DIAGNOSIS — C73 Malignant neoplasm of thyroid gland: Secondary | ICD-10-CM

## 2022-08-15 DIAGNOSIS — Z794 Long term (current) use of insulin: Secondary | ICD-10-CM

## 2022-08-15 NOTE — Telephone Encounter (Signed)
I'll call pt but wanted to make you aware to see if there is anything different you want him to do.

## 2022-08-15 NOTE — Telephone Encounter (Signed)
Pt made aware, voiced understanding. 

## 2022-08-15 NOTE — Telephone Encounter (Signed)
Pt left a VM stating that the CT that he was suppose to have with [facility], they are wanting him to be injected with Dye and he said that his PCP advised him not to do that because of his kidney issues. He is asking for a call back [phone]

## 2022-09-03 ENCOUNTER — Other Ambulatory Visit: Payer: Self-pay | Admitting: "Endocrinology

## 2022-09-03 ENCOUNTER — Ambulatory Visit (HOSPITAL_COMMUNITY)
Admission: RE | Admit: 2022-09-03 | Discharge: 2022-09-03 | Disposition: A | Discharge status: Home / Self Care | Payer: Medicare Other | Source: Ambulatory Visit | Attending: "Endocrinology | Admitting: "Endocrinology

## 2022-09-03 DIAGNOSIS — C73 Malignant neoplasm of thyroid gland: Secondary | ICD-10-CM | POA: Diagnosis not present

## 2022-09-03 DIAGNOSIS — E890 Postprocedural hypothyroidism: Secondary | ICD-10-CM

## 2022-09-03 DIAGNOSIS — E1169 Type 2 diabetes mellitus with other specified complication: Secondary | ICD-10-CM

## 2022-09-11 ENCOUNTER — Telehealth: Payer: Self-pay | Admitting: "Endocrinology

## 2022-09-11 ENCOUNTER — Other Ambulatory Visit: Payer: Self-pay

## 2022-09-11 DIAGNOSIS — Z794 Long term (current) use of insulin: Secondary | ICD-10-CM

## 2022-09-11 MED ORDER — TRESIBA FLEXTOUCH 200 UNIT/ML ~~LOC~~ SOPN: 80.0000 [IU] | PEN_INJECTOR | Freq: Every day | SUBCUTANEOUS | 1 refills | Status: DC

## 2022-09-11 MED ORDER — TOUJEO MAX SOLOSTAR 300 UNIT/ML ~~LOC~~ SOPN: 80.0000 [IU] | PEN_INJECTOR | Freq: Every day | SUBCUTANEOUS | 1 refills | Status: DC

## 2022-09-11 NOTE — Telephone Encounter (Signed)
Is there any contraindication to changing pt's insulin?

## 2022-09-11 NOTE — Telephone Encounter (Signed)
Pt called and stated ins will no longer cover Levimer and he is out.  He needs a a new prescription sent in for something to take place of his Levimer.

## 2022-09-12 MED ORDER — BASAGLAR KWIKPEN 100 UNIT/ML ~~LOC~~ SOPN: 60.0000 [IU] | PEN_INJECTOR | Freq: Every day | SUBCUTANEOUS | 0 refills | Status: DC

## 2022-09-12 NOTE — Telephone Encounter (Signed)
Rx for basaglar 60 units qhs sent in to replace levemir.

## 2022-09-17 LAB — THYROGLOBULIN LEVEL: Thyroglobulin (TG-RIA): <2 ng/mL

## 2022-09-17 LAB — COMPREHENSIVE METABOLIC PANEL
ALT: 25 IU/L (ref 0–44)
AST: 17 IU/L (ref 0–40)
Albumin/Globulin Ratio: 1.7 (ref 1.2–2.2)
Albumin: 4.5 g/dL (ref 3.8–4.8)
Alkaline Phosphatase: 95 IU/L (ref 44–121)
BUN/Creatinine Ratio: 15 (ref 10–24)
BUN: 17 mg/dL (ref 8–27)
Bilirubin Total: 0.5 mg/dL (ref 0.0–1.2)
CO2: 22 mmol/L (ref 20–29)
Calcium: 9.9 mg/dL (ref 8.6–10.2)
Chloride: 101 mmol/L (ref 96–106)
Creatinine, Ser: 1.1 mg/dL (ref 0.76–1.27)
Globulin, Total: 2.7 g/dL (ref 1.5–4.5)
Glucose: 216 mg/dL — ABNORMAL HIGH (ref 70–99)
Potassium: 4.2 mmol/L (ref 3.5–5.2)
Sodium: 140 mmol/L (ref 134–144)
Total Protein: 7.2 g/dL (ref 6.0–8.5)
eGFR: 72 mL/min/{1.73_m2} (ref >59)

## 2022-09-17 LAB — T4, FREE: Free T4: 1.5 ng/dL (ref 0.82–1.77)

## 2022-09-17 LAB — TSH: TSH: 5.12 u[IU]/mL — ABNORMAL HIGH (ref 0.450–4.500)

## 2022-09-17 LAB — THYROGLOBULIN ANTIBODY: Thyroglobulin Antibody: <1 IU/mL (ref 0.0–0.9)

## 2022-10-07 ENCOUNTER — Other Ambulatory Visit: Payer: Self-pay | Admitting: "Endocrinology

## 2022-10-09 ENCOUNTER — Other Ambulatory Visit: Payer: Self-pay

## 2022-10-10 ENCOUNTER — Encounter: Payer: Self-pay | Admitting: Radiology

## 2022-10-14 ENCOUNTER — Other Ambulatory Visit: Payer: Self-pay

## 2022-10-14 DIAGNOSIS — Z91199 Patient's noncompliance with other medical treatment and regimen due to unspecified reason: Secondary | ICD-10-CM

## 2022-10-14 MED ORDER — INSULIN LISPRO (1 UNIT DIAL) 100 UNIT/ML (KWIKPEN): 12.0000 [IU] | PEN_INJECTOR | Freq: Three times a day (TID) | SUBCUTANEOUS | 0 refills | Status: DC

## 2022-10-16 ENCOUNTER — Other Ambulatory Visit: Payer: Self-pay

## 2022-10-16 DIAGNOSIS — Z91199 Patient's noncompliance with other medical treatment and regimen due to unspecified reason: Secondary | ICD-10-CM

## 2022-10-22 ENCOUNTER — Other Ambulatory Visit: Payer: Self-pay | Admitting: "Endocrinology

## 2022-10-24 ENCOUNTER — Ambulatory Visit: Payer: Medicare Other | Admitting: "Endocrinology

## 2022-11-13 ENCOUNTER — Telehealth: Payer: Self-pay | Admitting: "Endocrinology

## 2022-11-13 ENCOUNTER — Other Ambulatory Visit: Payer: Self-pay | Admitting: "Endocrinology

## 2022-11-13 DIAGNOSIS — E890 Postprocedural hypothyroidism: Secondary | ICD-10-CM

## 2022-11-13 NOTE — Telephone Encounter (Signed)
Called pt and lvm to let him know that he needed to redo labs.

## 2022-11-13 NOTE — Telephone Encounter (Signed)
Pt has rescheduled appt for 12/03/22 do you need him to redo labs?

## 2022-12-03 ENCOUNTER — Ambulatory Visit (INDEPENDENT_AMBULATORY_CARE_PROVIDER_SITE_OTHER): Payer: Medicare Other | Admitting: "Endocrinology

## 2022-12-03 ENCOUNTER — Encounter: Payer: Self-pay | Admitting: "Endocrinology

## 2022-12-03 VITALS — BP 116/72 | HR 68 | Ht 67.0 in | Wt 196.2 lb

## 2022-12-03 DIAGNOSIS — E890 Postprocedural hypothyroidism: Secondary | ICD-10-CM

## 2022-12-03 DIAGNOSIS — E782 Mixed hyperlipidemia: Secondary | ICD-10-CM | POA: Diagnosis not present

## 2022-12-03 DIAGNOSIS — I10 Essential (primary) hypertension: Secondary | ICD-10-CM

## 2022-12-03 DIAGNOSIS — Z794 Long term (current) use of insulin: Secondary | ICD-10-CM | POA: Diagnosis not present

## 2022-12-03 DIAGNOSIS — E1169 Type 2 diabetes mellitus with other specified complication: Secondary | ICD-10-CM | POA: Diagnosis not present

## 2022-12-03 DIAGNOSIS — Z85850 Personal history of malignant neoplasm of thyroid: Secondary | ICD-10-CM

## 2022-12-03 DIAGNOSIS — Z91199 Patient's noncompliance with other medical treatment and regimen due to unspecified reason: Secondary | ICD-10-CM

## 2022-12-03 LAB — POCT GLYCOSYLATED HEMOGLOBIN (HGB A1C): HbA1c, POC (controlled diabetic range): 8.9 % — AB (ref 0.0–7.0)

## 2022-12-03 MED ORDER — METFORMIN HCL 500 MG PO TABS: 500.0000 mg | ORAL_TABLET | Freq: Two times a day (BID) | ORAL | 1 refills | Status: DC

## 2022-12-03 MED ORDER — EMPAGLIFLOZIN 10 MG PO TABS: 10.0000 mg | ORAL_TABLET | Freq: Every day | ORAL | 2 refills | Status: DC

## 2022-12-03 MED ORDER — INSULIN LISPRO (1 UNIT DIAL) 100 UNIT/ML (KWIKPEN): 10.0000 [IU] | PEN_INJECTOR | Freq: Three times a day (TID) | SUBCUTANEOUS | 1 refills | Status: DC

## 2022-12-03 MED ORDER — TOUJEO MAX SOLOSTAR 300 UNIT/ML ~~LOC~~ SOPN: 80.0000 [IU] | PEN_INJECTOR | Freq: Every evening | SUBCUTANEOUS | 2 refills | Status: DC

## 2022-12-03 MED ORDER — LEVOTHYROXINE SODIUM 150 MCG PO TABS: ORAL_TABLET | ORAL | 1 refills | Status: DC

## 2022-12-03 NOTE — Progress Notes (Signed)
12/03/2022  Endocrinology follow-up note   Subjective:    Patient ID: Dean Hurley, male    DOB: 02-Jun-1951,   Chief complaint  Patient is here to follow-up for 1)  History of thyroid malignancy 2) postsurgical hypothyroidism 3)  Uncontrolled type 2 diabetes  4) hyperlipidemia 5) hypertension   Past Medical History:  Diagnosis Date   Cancer    Diabetes mellitus without complication    Hypertension    Thyroid cancer    Past Surgical History:  Procedure Laterality Date   BACK SURGERY     BLADDER SURGERY  2024   THYROID SURGERY     Thyroidectomy   TRANSURETHRAL RESECTION OF PROSTATE     Social History   Socioeconomic History   Marital status: Married    Spouse name: Not on file   Number of children: Not on file   Years of education: Not on file   Highest education level: Not on file  Occupational History   Not on file  Tobacco Use   Smoking status: Former    Types: Cigarettes   Smokeless tobacco: Never  Vaping Use   Vaping Use: Never used  Substance and Sexual Activity   Alcohol use: No    Alcohol/week: 0.0 standard drinks of alcohol   Drug use: No   Sexual activity: Not on file  Other Topics Concern   Not on file  Social History Narrative   Not on file   Social Determinants of Health   Financial Resource Strain: Not on file  Food Insecurity: Not on file  Transportation Needs: Not on file  Physical Activity: Not on file  Stress: Not on file  Social Connections: Not on file   Outpatient Encounter Medications as of 12/03/2022  Medication Sig   empagliflozin (JARDIANCE) 10 MG TABS tablet Take 1 tablet (10 mg total) by mouth daily before breakfast.   insulin glargine, 2 Unit Dial, (TOUJEO MAX SOLOSTAR) 300 UNIT/ML Solostar Pen Inject 80 Units into the skin at bedtime.   [DISCONTINUED] glipiZIDE (GLUCOTROL XL) 10 MG 24 hr tablet Take 10 mg by mouth daily with breakfast.   alum & mag hydroxide-simeth (MAALOX/MYLANTA) 200-200-20 MG/5ML  suspension Take 20 mLs by mouth every 6 (six) hours as needed for heartburn.   B-D ULTRAFINE III SHORT PEN 31G X 8 MM MISC USE WITH INSULIN FOUR TIMES A DAY   Coenzyme Q10 (COQ10 PO) Take 1 tablet by mouth daily in the afternoon.   Continuous Blood Gluc Receiver (FREESTYLE LIBRE 2 READER) DEVI Use as directed to check glucose   Continuous Blood Gluc Receiver (FREESTYLE LIBRE 2 READER) DEVI As directed   Continuous Blood Gluc Sensor (FREESTYLE LIBRE 2 SENSOR) MISC Change sensor every 14 days   Continuous Blood Gluc Sensor (FREESTYLE LIBRE 2 SENSOR) MISC 1 Piece by Does not apply route every 14 (fourteen) days.   diclofenac (VOLTAREN) 75 MG EC tablet Take 75 mg by mouth 2 (two) times daily.   fenofibrate 160 MG tablet Take 160 mg by mouth daily.   insulin lispro (HUMALOG KWIKPEN) 100 UNIT/ML KwikPen Inject 10-16 Units into the skin 3 (three) times daily.   IRON, FERROUS SULFATE, PO Take 1 tablet by mouth daily in the afternoon.   levothyroxine (SYNTHROID) 150 MCG tablet TAKE 1 TABLET BY MOUTH EVERY DAY BEFORE BREAKFAST   losartan-hydrochlorothiazide (HYZAAR) 50-12.5 MG tablet Take 1 tablet by mouth daily.   meclizine (ANTIVERT) 25 MG tablet  Take 25 mg by mouth 3 (three) times daily as needed for dizziness.   metFORMIN (GLUCOPHAGE) 500 MG tablet Take 1 tablet (500 mg total) by mouth 2 (two) times daily with a meal.   triamcinolone cream (KENALOG) 0.1 % Apply 1 application topically daily as needed (for scaly skin).    [DISCONTINUED] Insulin Glargine (BASAGLAR KWIKPEN) 100 UNIT/ML Inject 60 Units into the skin at bedtime. (Patient taking differently: Inject 60-80 Units into the skin at bedtime.)   [DISCONTINUED] insulin lispro (HUMALOG KWIKPEN) 100 UNIT/ML KwikPen Inject 12-18 Units into the skin 3 (three) times daily.   [DISCONTINUED] levothyroxine (SYNTHROID) 150 MCG tablet TAKE 1 TABLET BY MOUTH EVERY DAY BEFORE BREAKFAST   [DISCONTINUED] metFORMIN (GLUCOPHAGE) 500 MG tablet Take 1 tablet by mouth  2 (two) times daily with a meal.   No facility-administered encounter medications on file as of 12/03/2022.   ALLERGIES: Allergies  Allergen Reactions   Codeine Other (See Comments)    Feels faint, flushed almost passed out almost passed out    Contrast Media [Iodinated Contrast Media]    VACCINATION STATUS: Immunization History  Administered Date(s) Administered   Moderna Sars-Covid-2 Vaccination 10/26/2019, 11/23/2019, 06/28/2020, 02/05/2021    Diabetes    72 year old male with multiple medical problems. He underwent total thyroidectomy in [facility] on June 02, 2012.  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 1.7 cm cancer nodule on the right lobe and 0.4 cm cancer nodule on the left lobe. Partial invasion of capsule, margins negative. He underwent thyrogen stimulated I131  thyroid remnant ablation with negative whole body scan for distant metastasis on July 17, 2013.    May 2014 thyroid /neck ultrasound :  negative for any mass lesion.   Thyrogen stimulated WBS on 10/25/13 was c/w successful ablation of thyroid remnants with no evidence of local recurrence or metastatic disease. He has  had another thyroid ultrasound on 10/11/14 which was negative for residual thyroid tissue. On his next  surveillance thyroid ultrasound on September 6,2016 however he has had a 9 mm residual tissue on the  right thyroid bed, which grew to 12 mm in follow-up ultrasound in December 2016. He was sent to [facility] where he had his prior surgery. Fine needle  aspiration of the residual tissue did not reveal diagnostic material.  - On thyroid/neck ultrasound  on 02/01/2016 showed that the remnant thyroid tissue decreased in size to 1.0 cm, all other  findings unchanged.  - His last  Thyrogen stimulated whole-body scan on  06/21/2016 was  reported to be negative for any iodine avid metastatic thyroid cancer.  - On 06/27/2017 : His  ultrasound at an  outside facility in [facility] revealed an echogenic focus measuring 1.7cm x 0.7cm x 1.1 cm ( previously 1 cm-1.2 cm in [facility] imaging on 02/01/2016) potentially representing a small amount of residual thyroid tissue versus small lymph node. There is no identifiable tissue on the left, no identifiable isthmus.  -He pushed to get his previsit ultrasound prematurely on August 08, 2017 which showed no significant change in the thyroid remnant at 1.2 cm.  -His most recent  repeat surveillance thyroid/neck ultrasound on February 02, 2018 is unchanged showing stable 1.2 cm nodular tissue ithin the right lobectomy bed, unchanged since December 2016.   September 07, 2018: Thyrogen stimulated whole-body scan negative for any iodine avid metastatic thyroid cancer. -See his previous visit notes. - surveillance thyroid/neck ultrasound on August 20, 2019 which revealed stable  1.2 cm thyroid remnant tissue on the right thyroid bed.  No additional or new findings.   His thyroid/neck ultrasound on April 03, 2021 showed surgical changes of total thyroidectomy, no measurable residual thyroid tissue identified on the current study.  His most recent thyroid ultrasound on March 08, 2022 showed 17 mm x 6 mm x 8 mm tissue within the right surgical thyroid gait, not identified on prior ultrasound.  Due to patient's history of contrast allergy, previsit CT was done without contrast showing no definite CT correlate for soft tissue was seen.  Nonvisualization may be due to a combination of small size and noncontrast enhanced CT technique.   There was no evidence of nodal or distant metastatic disease.    He denies any dysphagia, shortness of breath, nor voice change.  He is currently on levothyroxine 150 mcg p.o. daily before breakfast.  His previsit thyroid function tests are consistent with slight under replacement, however patient has documented history of inconsistency taking his thyroid hormone.     For his uncontrolled type 2 diabetes, for which he is on multiple daily injections of insulin along with metformin.  He presents with loss of control, with point-of-care A1c of 8.9%.  His AGP report shows 54% time in range, 37% level 1 hyperglycemia, 9% level 2 hyperglycemia.  He does not have hypoglycemia.  His average blood glucose for the last 14 days is 182 mg per DL.  His previsit labs show improved renal function compared to his prior measurements.  His recent labs show worsening her lipid panel.  His insurance did not provide coverage for Repatha, patient continues to avoid statins due to side effects.  His engagement with lifestyle nutrition is suboptimal.  He remains on fenofibrate 160 mg p.o. daily.    Review of Systems Limited as above.   Objective:    BP 116/72   Pulse 68   Ht 5\' 7"  (1.702 m)   Wt 196 lb 3.2 oz (89 kg)   BMI 30.73 kg/m   Wt Readings from Last 3 Encounters:  12/03/22 196 lb 3.2 oz (89 kg)  03/27/22 199 lb 9.6 oz (90.5 kg)  02/13/22 205 lb 3.2 oz (93.1 kg)    Physical Exam          Latest Ref Rng & Units 09/10/2022   12:05 PM 06/03/2022   10:54 AM 03/21/2022   10:10 AM  CMP  Glucose 70 - 99 mg/dL 191  478  295   BUN 8 - 27 mg/dL 17  20  28    Creatinine 0.76 - 1.27 mg/dL 6.21  3.08  6.57   Sodium 134 - 144 mmol/L 140  138  143   Potassium 3.5 - 5.2 mmol/L 4.2  4.6  4.6   Chloride 96 - 106 mmol/L 101  102  105   CO2 20 - 29 mmol/L 22  22  18    Calcium 8.6 - 10.2 mg/dL 9.9  9.8  9.7   Total Protein 6.0 - 8.5 g/dL 7.2  7.0  7.0   Total Bilirubin 0.0 - 1.2 mg/dL 0.5  0.2  0.2   Alkaline Phos 44 - 121 IU/L 95  86  73   AST 0 - 40 IU/L 17  21  23    ALT 0 - 44 IU/L 25  25  28     Recent Results (from the past 2160 hour(s))  Thyroglobulin Level     Status: None   Collection Time: 09/10/22 12:05 PM  Result Value Ref  Range   Thyroglobulin (TG-RIA) <2.0 ng/mL    Comment: This test was developed and its performance characteristics determined by  [facility]. It has not been cleared or approved by the Food and Drug Administration. Reference Range: Pubertal Children and Adults: <40 According to the [facility] of Clinical Biochemistry, the reference interval for Thyroglobulin (TG) should be related to euthyroid patients and not for patients who underwent thyroidectomy.  TG reference intervals for these patients depend on the residual mass of the thyroid tissue left after surgery.  Establishing a post-operative baseline is recommended.  The assay quantitation limit is 2.0 ng/mL.   Thyroglobulin antibody     Status: None   Collection Time: 09/10/22 12:05 PM  Result Value Ref Range   Thyroglobulin Antibody <1.0 0.0 - 0.9 IU/mL    Comment: Thyroglobulin Antibody measured by [employer] Methodology  TSH     Status: Abnormal   Collection Time: 09/10/22 12:05 PM  Result Value Ref Range   TSH 5.120 (H) 0.450 - 4.500 uIU/mL  T4, Free     Status: None   Collection Time: 09/10/22 12:05 PM  Result Value Ref Range   Free T4 1.50 0.82 - 1.77 ng/dL  Comprehensive metabolic panel     Status: Abnormal   Collection Time: 09/10/22 12:05 PM  Result Value Ref Range   Glucose 216 (H) 70 - 99 mg/dL   BUN 17 8 - 27 mg/dL   Creatinine, Ser 0.98 0.76 - 1.27 mg/dL   eGFR 72 >11 BJ/YNW/2.95   BUN/Creatinine Ratio 15 10 - 24   Sodium 140 134 - 144 mmol/L   Potassium 4.2 3.5 - 5.2 mmol/L   Chloride 101 96 - 106 mmol/L   CO2 22 20 - 29 mmol/L   Calcium 9.9 8.6 - 10.2 mg/dL   Total Protein 7.2 6.0 - 8.5 g/dL   Albumin 4.5 3.8 - 4.8 g/dL   Globulin, Total 2.7 1.5 - 4.5 g/dL   Albumin/Globulin Ratio 1.7 1.2 - 2.2   Bilirubin Total 0.5 0.0 - 1.2 mg/dL   Alkaline Phosphatase 95 44 - 121 IU/L   AST 17 0 - 40 IU/L   ALT 25 0 - 44 IU/L  HgB A1c     Status: Abnormal   Collection Time: 12/03/22  8:43 AM  Result Value Ref Range   Hemoglobin A1C     HbA1c POC (<> result, manual entry)     HbA1c, POC (prediabetic range)     HbA1c, POC  (controlled diabetic range) 8.9 (A) 0.0 - 7.0 %   Lipid Panel     Component Value Date/Time   CHOL 240 (H) 06/03/2022 1054   TRIG 260 (H) 06/03/2022 1054   HDL 37 (L) 06/03/2022 1054   CHOLHDL 6.5 (H) 06/03/2022 1054   LDLCALC 155 (H) 06/03/2022 1054   LABVLDL 48 (H) 06/03/2022 1054      Thyrogen stimulated whole-body scan on 06/21/2016: IMPRESSION: Negative whole-body I 131 scan for recurrent iodine avid thyroid Cancer.  06/14/2016 thyroglobulin antibodies negative, thyroglobulin by IME less than 0.1  06/27/2017 - His previsit ultrasound at an outside facility in [facility] revealed an echogenic focus measuring 1.7cm x 0.7cm x 1.1 cm ( previously 1 cm-1.2 cm in [facility] imaging on 02/01/2016) potentially representing a small amount of residual thyroid tissue versus small lymph node. There is no identifiable tissue on the left, no identifiable isthmus.  August 08, 2018 thyroid/neck ultrasound in [facility]:   FINDINGS: Residual solid nodular  isoechoic tissue in the right thyroid noted measures 1.2 x 1.0 x 1.0 cm, previously 1.0 x 0.7 x 0.8 cm. No significant interval change. No new abnormality.  Similar ultrasound findings on February 02, 2018. See above.  September 07, 2018 thyroid stimulated whole-body scan negative for iodine avid distant metastasis.  Thyroglobulin by IMA on October 08, 2018 undetectable.  Thyroid/neck surveillance ultrasound August 20, 2019 IMPRESSION: Stable soft tissue in the right thyroidectomy bed. No significant change from the previous examination. No other evidence for recurrent or residual thyroid tissue.  Thyroid ultrasound on March 08, 2022 IMPRESSION: Ultrasound survey of the neck demonstrates tissue within the right surgical thyroid bed measuring 17 mm x 6 mm x 8 mm, not identified on the prior ultrasound. Recurrent tissue growth not excluded, and correlation with thyroid lab values may be useful, as well  as consideration of imaging such as contrast-enhanced neck CT and/or nuclear medicine study.   CT scan soft tissue neck without contrast on September 03, 2022  IMPRESSION: 1. Status post total thyroidectomy. No definite CT correlate is visualized for soft tissue seen on thyroid ultrasound dated 03/08/2022. Non visualization may be due to a combination of small size and noncontrast enhanced CT technique. 2. No evidence of nodal or distant metastatic disease.    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 on July 17, 2012, thyrogen stimulated RAI thyroid remnant ablation given his BRAF positive presentation.  The post therapy scan was negative for distant metastasis. His May 2014 thyroid ultrasound is negative for remnant tissue.  Subsequent surveillance thyrogen stimulated WBS on 10/25/13 was c/w successful ablation of thyroid remnants, with no evidence of local recurrence nor metastatic disease.  His repeat thyroid ultrasound on 10/11/14 showed no residual thyroid tissue. However, on his last surveillance thyroid/neck ultrasound she was found to have a new 9mm residual/recurrent soft tissue nodule in the right  thyroidectomy bed. On subsequent surveillance thyroid ultrasound this tissue was found to be larger at 12 mm. Attempt to biopsy this up to [facility] was not diagnostic.   - On thyroid/neck ultrasound before this visit on 02/01/2016 showed that the remnant thyroid tissue decreased in size to 1.0 cm, all other  findings unchanged.  There was no sign of residual thyroid tissue on whole-body scan performed 2015.  She is thyroglobulin by IMA was less than 0.1 associated with negative thyroglobulin antibodies on 06/14/2016.  - Thyrogen stimulated whole-body scan on 06/21/2016 was negative for iodine avid metastatic thyroid cancer.   -  thyroid/neck ultrasound on 06/27/2017 showed persistence of possibly thyroid tissue in the right thyroid lobe bed- this was done within a different facility than his prior imaging studies-subject to variations of measurements.  -  thyroid/neck ultrasound in [facility] in June 2019- shows stable finding of 1.2 cm nodular area within the right lobectomy bed. No intervention at this time. His  whole-body scan after Thyrogen stimulation was negative for distant metastasis on September 07, 2018.    Surveillance thyroid/neck ultrasound on January 2021 Stable soft tissue in the right thyroidectomy bed. No significant change from the previous examination. No other evidence for recurrent or residual thyroid tissue. -His August 2022  thyroid/neck ultrasound was unremarkable.  He is currently 9 years out from his original surgery.    Previsit thyroid ultrasound on March 08, 2022 showed  that his previously documented tissue on the right thyroid bed is still present measuring 17 mm.  Per radiology recommendation, he underwent CT soft tissue/neck without contrast due to his history of reaction to contrast .  There was no CT correlate for the area of concern, no evidence of distant metastasis. He will be continued on expectant management at this time.  2. Postsurgical hypothyroidism -His previsit labs are consistent with slight under replacement, however this is due to his inconsistency taking his Synthroid.  I discussed and kept him on the same dose of Synthroid at 150 mcg p.o. daily before breakfast.    - We discussed about the correct intake of his thyroid hormone, on empty stomach at fasting, with water, separated by at least 30 minutes from breakfast and other medications,  and separated by more than 4 hours from calcium, iron, multivitamins, acid reflux medications (PPIs). -Patient is made aware of the fact that thyroid hormone replacement is needed for life, dose to be adjusted by periodic  monitoring of thyroid function tests.   3. Uncontrolled type 2 diabetes mellitus with complication, with long-term current use of insulin (HCC) - He has engaged better for his diabetes management this time.  His freestyle libre device AGP report shows 54% time range, 37% level 1 hyperglycemia, 9% level 2 hyperglycemia.  No hypoglycemia.  His average blood glucose is 150for the last 14 days. -His recent  point-of-care A1c is 8.9%, significantly worsening from 8% during his last visit.     He will need treatment with a higher dose of insulin. -I discussed and increase his Basaglar to 80 units nightly lowered his Humalog to 10 -16  units 3 times daily AC for Premeal blood glucose readings above 90 mg per DL. He remains  a good candidate for lifestyle medicine.  I discussed and discontinued glipizide for him.  He will continue to need metformin 500 mg p.o. twice daily.  He may benefit from low-dose SGLT2 inhibitors.  I discussed initiated Jardiance 10 mg p.o. daily at breakfast.  Side effects and precautions discussed with him. Due to his multiple manifestations of metabolic syndrome, he is still a good candidate for lifestyle medicine. - he acknowledges that there is a room for improvement in his food and drink choices. - Suggestion is made for him to avoid simple carbohydrates  from his diet including Cakes, Sweet Desserts, Ice Cream, Soda (diet and regular), Sweet Tea, Candies, Chips, Cookies, Store Bought Juices, Alcohol , Artificial Sweeteners,  Coffee Creamer, and "Sugar-free" Products, Lemonade. This will help patient to have more stable blood glucose profile and potentially avoid unintended weight gain.  The following Lifestyle Medicine recommendations according to American College of Lifestyle Medicine  [facility]) were discussed and and offered to patient and he  agrees to start the journey:  A. Whole Foods, Plant-Based Nutrition comprising of fruits and vegetables, plant-based proteins,  whole-grain carbohydrates was discussed in detail with the patient.   A list for source of those nutrients were also provided to the patient.  Patient will use only water or unsweetened tea for hydration. B.  The need to stay away from risky substances including alcohol, smoking; obtaining 7 to 9 hours of restorative sleep, at least 150 minutes of moderate intensity exercise weekly, the importance of healthy social connections,  and stress management techniques were discussed. C.  A full color page of  Calorie density of various food groups per pound showing examples of each food groups was provided to the patient.  4. Hyperlipidemia:  His lipid panel still abnormal with LDL 155.  He did not get appropriate coverage for Repatha.  He does not tolerate statins.  He is advised to continue fenofibrate 160 mg p.o. daily.  Once again, whole food plant-based diet discussed recommended to him to manage dyslipidemia.  5. Essential hypertension: -His blood pressure is controlled to target.  He is advised to continue on his current medications including lisinopril/HCTZ 20/12.5 mg p.o. daily at breakfast.    His diabetes foot exam was normal today.  I advised patient to maintain close follow up with his PCP for primary care needs.  I spent  41  minutes in the care of the patient today including review of labs from CMP, Lipids, Thyroid Function, Hematology (current and previous including abstractions from other facilities); face-to-face time discussing  his blood glucose readings/logs, discussing hypoglycemia and hyperglycemia episodes and symptoms, medications doses, his options of short and long term treatment based on the latest standards of care / guidelines;  discussion about incorporating lifestyle medicine;  and documenting the encounter. Risk reduction counseling performed per USPSTF guidelines to reduce obesity and cardiovascular risk factors.     Please refer to Patient Instructions for Blood Glucose  Monitoring and Insulin/Medications Dosing Guide"  in media tab for additional information. Please  also refer to " Patient Self Inventory" in the Media  tab for reviewed elements of pertinent patient history.  Wendi Maya participated in the discussions, expressed understanding, and voiced agreement with the above plans.  All questions were answered to his satisfaction. he is encouraged to contact clinic should he have any questions or concerns prior to his return visit.   Follow up plan: Return in about 3 months (around 03/04/2023) for F/U with Pre-visit Labs, Meter/CGM/Logs, A1c here, Urine MA - [place].  Marquis Lunch, MD Phone: [phone]  Fax: [phone]   This note was partially dictated with voice recognition software. Similar sounding words can be transcribed inadequately or may not  be corrected upon review.  12/03/2022, 3:11 PM

## 2022-12-03 NOTE — Patient Instructions (Signed)

## 2023-02-20 ENCOUNTER — Other Ambulatory Visit: Payer: Self-pay | Admitting: "Endocrinology

## 2023-02-20 DIAGNOSIS — Z91199 Patient's noncompliance with other medical treatment and regimen due to unspecified reason: Secondary | ICD-10-CM

## 2023-03-11 ENCOUNTER — Ambulatory Visit: Payer: Medicare Other | Admitting: "Endocrinology

## 2023-03-17 ENCOUNTER — Other Ambulatory Visit: Payer: Self-pay | Admitting: "Endocrinology

## 2023-03-19 ENCOUNTER — Encounter: Payer: Self-pay | Admitting: "Endocrinology

## 2023-03-19 ENCOUNTER — Ambulatory Visit (INDEPENDENT_AMBULATORY_CARE_PROVIDER_SITE_OTHER): Payer: Medicare Other | Admitting: "Endocrinology

## 2023-03-19 VITALS — BP 128/70 | HR 60 | Ht 67.0 in | Wt 202.0 lb

## 2023-03-19 DIAGNOSIS — E1169 Type 2 diabetes mellitus with other specified complication: Secondary | ICD-10-CM

## 2023-03-19 DIAGNOSIS — E782 Mixed hyperlipidemia: Secondary | ICD-10-CM

## 2023-03-19 DIAGNOSIS — Z91199 Patient's noncompliance with other medical treatment and regimen due to unspecified reason: Secondary | ICD-10-CM

## 2023-03-19 DIAGNOSIS — I10 Essential (primary) hypertension: Secondary | ICD-10-CM

## 2023-03-19 DIAGNOSIS — Z794 Long term (current) use of insulin: Secondary | ICD-10-CM | POA: Diagnosis not present

## 2023-03-19 DIAGNOSIS — Z85850 Personal history of malignant neoplasm of thyroid: Secondary | ICD-10-CM

## 2023-03-19 DIAGNOSIS — E890 Postprocedural hypothyroidism: Secondary | ICD-10-CM

## 2023-03-19 LAB — POCT GLYCOSYLATED HEMOGLOBIN (HGB A1C): HbA1c, POC (controlled diabetic range): 8.8 % — AB (ref 0.0–7.0)

## 2023-03-19 MED ORDER — TOUJEO MAX SOLOSTAR 300 UNIT/ML ~~LOC~~ SOPN: 90.0000 [IU] | PEN_INJECTOR | Freq: Every evening | SUBCUTANEOUS | 1 refills | Status: DC

## 2023-03-19 MED ORDER — INSULIN LISPRO (1 UNIT DIAL) 100 UNIT/ML (KWIKPEN): 14.0000 [IU] | PEN_INJECTOR | Freq: Three times a day (TID) | SUBCUTANEOUS | 2 refills | Status: DC

## 2023-03-19 MED ORDER — EMPAGLIFLOZIN 10 MG PO TABS: 10.0000 mg | ORAL_TABLET | Freq: Every day | ORAL | 1 refills | Status: DC

## 2023-03-19 NOTE — Progress Notes (Signed)
03/19/2023  Endocrinology follow-up note   Subjective:    Patient ID: Dean Hurley, male    DOB: 27-Dec-1950,   Chief complaint  Patient is here to follow-up for 1) History of thyroid malignancy 2) postsurgical hypothyroidism 3)  Uncontrolled type 2 diabetes  4) hyperlipidemia 5) hypertension   Past Medical History:  Diagnosis Date   Cancer (HCC)    Diabetes mellitus without complication (HCC)    Hypertension    Thyroid cancer (HCC)    Past Surgical History:  Procedure Laterality Date   BACK SURGERY     BLADDER SURGERY  2024   THYROID SURGERY     Thyroidectomy   TRANSURETHRAL RESECTION OF PROSTATE     Social History   Socioeconomic History   Marital status: Married    Spouse name: Not on file   Number of children: Not on file   Years of education: Not on file   Highest education level: Not on file  Occupational History   Not on file  Tobacco Use   Smoking status: Former    Types: Cigarettes   Smokeless tobacco: Never  Vaping Use   Vaping status: Never Used  Substance and Sexual Activity   Alcohol use: No    Alcohol/week: 0.0 standard drinks of alcohol   Drug use: No   Sexual activity: Not on file  Other Topics Concern   Not on file  Social History Narrative   Not on file   Social Determinants of Health   Financial Resource Strain: Not on file  Food Insecurity: Not on file  Transportation Needs: Not on file  Physical Activity: Not on file  Stress: Not on file  Social Connections: Not on file   Outpatient Encounter Medications as of 03/19/2023  Medication Sig   alum & mag hydroxide-simeth (MAALOX/MYLANTA) 200-200-20 MG/5ML suspension Take 20 mLs by mouth every 6 (six) hours as needed for heartburn.   B-D ULTRAFINE III SHORT PEN 31G X 8 MM MISC USE WITH INSULIN FOUR TIMES A DAY   Coenzyme Q10 (COQ10 PO) Take 1 tablet by mouth daily in the afternoon.   Continuous Blood Gluc Receiver (FREESTYLE LIBRE 2 READER) DEVI Use as directed to  check glucose   Continuous Blood Gluc Receiver (FREESTYLE LIBRE 2 READER) DEVI As directed   diclofenac (VOLTAREN) 75 MG EC tablet Take 75 mg by mouth 2 (two) times daily.   empagliflozin (JARDIANCE) 10 MG TABS tablet Take 1 tablet (10 mg total) by mouth daily before breakfast.   fenofibrate 160 MG tablet Take 160 mg by mouth daily.   insulin glargine, 2 Unit Dial, (TOUJEO MAX SOLOSTAR) 300 UNIT/ML Solostar Pen Inject 90 Units into the skin at bedtime.   insulin lispro (HUMALOG) 100 UNIT/ML KwikPen Inject 14-20 Units into the skin 3 (three) times daily before meals.   IRON, FERROUS SULFATE, PO Take 1 tablet by mouth daily in the afternoon.   levothyroxine (SYNTHROID) 150 MCG tablet TAKE 1 TABLET BY MOUTH EVERY DAY BEFORE BREAKFAST   losartan-hydrochlorothiazide (HYZAAR) 50-12.5 MG tablet Take 1 tablet by mouth daily.   meclizine (ANTIVERT) 25 MG tablet Take 25 mg by mouth 3 (three) times daily as needed for dizziness.   metFORMIN (GLUCOPHAGE) 500 MG tablet Take 1 tablet (500 mg total) by mouth 2 (two) times daily with a meal.   triamcinolone cream (KENALOG) 0.1 % Apply 1 application topically daily as needed (for scaly skin).    [  DISCONTINUED] Continuous Blood Gluc Sensor (FREESTYLE LIBRE 2 SENSOR) MISC Change sensor every 14 days   [DISCONTINUED] Continuous Blood Gluc Sensor (FREESTYLE LIBRE 2 SENSOR) MISC 1 Piece by Does not apply route every 14 (fourteen) days.   [DISCONTINUED] empagliflozin (JARDIANCE) 10 MG TABS tablet Take 1 tablet (10 mg total) by mouth daily before breakfast.   [DISCONTINUED] insulin lispro (HUMALOG) 100 UNIT/ML KwikPen INJECT 12 TO 18 UNITS SUBCUTANEOUSLY THREE TIMES DAILY, PLEASE KEEP APPOINTMENT   [DISCONTINUED] TOUJEO MAX SOLOSTAR 300 UNIT/ML Solostar Pen INJECT 80 UNITS SUBCUTANEOUSLY AT BEDTIME   No facility-administered encounter medications on file as of 03/19/2023.   ALLERGIES: Allergies  Allergen Reactions   Codeine Other (See Comments)    Feels faint,  flushed almost passed out almost passed out    Contrast Media [Iodinated Contrast Media]    VACCINATION STATUS: Immunization History  Administered Date(s) Administered   Moderna Sars-Covid-2 Vaccination 10/26/2019, 11/23/2019, 06/28/2020, 02/05/2021    Diabetes    72 year old male with multiple medical problems. He underwent total thyroidectomy in [facility] on June 02, 2012.  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 1.7 cm cancer nodule on the right lobe and 0.4 cm cancer nodule on the left lobe. Partial invasion of capsule, margins negative. He underwent thyrogen stimulated I131  thyroid remnant ablation with negative whole body scan for distant metastasis on July 17, 2013.    May 2014 thyroid /neck ultrasound :  negative for any mass lesion.   Thyrogen stimulated WBS on 10/25/13 was c/w successful ablation of thyroid remnants with no evidence of local recurrence or metastatic disease. He has  had another thyroid ultrasound on 10/11/14 which was negative for residual thyroid tissue. On his next  surveillance thyroid ultrasound on September 6,2016 however he has had a 9 mm residual tissue on the  right thyroid bed, which grew to 12 mm in follow-up ultrasound in December 2016. He was sent to [facility] where he had his prior surgery. Fine needle  aspiration of the residual tissue did not reveal diagnostic material.  - On thyroid/neck ultrasound  on 02/01/2016 showed that the remnant thyroid tissue decreased in size to 1.0 cm, all other  findings unchanged.  - His last  Thyrogen stimulated whole-body scan on  06/21/2016 was  reported to be negative for any iodine avid metastatic thyroid cancer.  - On 06/27/2017 : His  ultrasound at an outside facility in [facility] revealed an echogenic focus measuring 1.7cm x 0.7cm x 1.1 cm ( previously 1 cm-1.2 cm in [facility] imaging on 02/01/2016) potentially representing a  small amount of residual thyroid tissue versus small lymph node. There is no identifiable tissue on the left, no identifiable isthmus.  -He pushed to get his previsit ultrasound prematurely on August 08, 2017 which showed no significant change in the thyroid remnant at 1.2 cm.  -His most recent  repeat surveillance thyroid/neck ultrasound on February 02, 2018 is unchanged showing stable 1.2 cm nodular tissue ithin the right lobectomy bed, unchanged since December 2016.   September 07, 2018: Thyrogen stimulated whole-body scan negative for any iodine avid metastatic thyroid cancer. -See his previous visit notes. - surveillance thyroid/neck ultrasound on August 20, 2019 which revealed stable 1.2 cm thyroid remnant tissue on the right thyroid bed.  No additional or new findings.   His thyroid/neck ultrasound on April 03, 2021 showed surgical changes of total thyroidectomy, no measurable residual thyroid tissue identified on the current study.  His  most recent thyroid ultrasound on March 08, 2022 showed 17 mm x 6 mm x 8 mm tissue within the right surgical thyroid gait, not identified on prior ultrasound.  Due to patient's history of contrast allergy, previsit CT was done without contrast showing no definite CT correlate for soft tissue was seen.  Nonvisualization may be due to a combination of small size and noncontrast enhanced CT technique.   There was no evidence of nodal or distant metastatic disease.    He denies any dysphagia, shortness of breath, nor voice change.  He is currently on levothyroxine 150 mcg p.o. daily before breakfast.  His previsit thyroid function tests are consistent with appropriate replacement,  however patient has documented history of inconsistency taking his thyroid hormone.    For his uncontrolled type 2 diabetes, for which he is on multiple daily injections of insulin along with metformin.  He presents with unchanged profile with point-of-care A1c of 8.8%.  He AGP reports  on his CGM shows 39% time in range, 48% level 1 hyperglycemia, 13% level to hyperglycemia.  He did not document any hypoglycemia.  His average blood glucose is 196 for the last 14 days.    His previsit labs show improved renal function compared to his prior measurements.  His recent labs show worsening her lipid panel.  His insurance did not provide coverage for Repatha, patient continues to avoid statins due to side effects.  His engagement with lifestyle nutrition is suboptimal.  He remains on fenofibrate 160 mg p.o. daily.    Review of Systems Limited as above.   Objective:    BP 128/70   Pulse 60   Ht 5\' 7"  (1.702 m)   Wt 202 lb (91.6 kg)   BMI 31.64 kg/m   Wt Readings from Last 3 Encounters:  03/19/23 202 lb (91.6 kg)  12/03/22 196 lb 3.2 oz (89 kg)  03/27/22 199 lb 9.6 oz (90.5 kg)    Physical Exam          Latest Ref Rng & Units 03/12/2023    9:11 AM 09/10/2022   12:05 PM 06/03/2022   10:54 AM  CMP  Glucose 70 - 99 mg/dL 161  096  045   BUN 8 - 27 mg/dL 21  17  20    Creatinine 0.76 - 1.27 mg/dL 4.09  8.11  9.14   Sodium 134 - 144 mmol/L 140  140  138   Potassium 3.5 - 5.2 mmol/L 3.8  4.2  4.6   Chloride 96 - 106 mmol/L 101  101  102   CO2 20 - 29 mmol/L 22  22  22    Calcium 8.6 - 10.2 mg/dL 9.6  9.9  9.8   Total Protein 6.0 - 8.5 g/dL 7.0  7.2  7.0   Total Bilirubin 0.0 - 1.2 mg/dL 0.3  0.5  0.2   Alkaline Phos 44 - 121 IU/L 97  95  86   AST 0 - 40 IU/L 13  17  21    ALT 0 - 44 IU/L 21  25  25     Recent Results (from the past 2160 hour(s))  Comprehensive metabolic panel     Status: Abnormal   Collection Time: 03/12/23  9:11 AM  Result Value Ref Range   Glucose 214 (H) 70 - 99 mg/dL   BUN 21 8 - 27 mg/dL   Creatinine, Ser 7.82 (H) 0.76 - 1.27 mg/dL   eGFR 59 (L) >95 AO/ZHY/8.65   BUN/Creatinine Ratio 16 10 -  24   Sodium 140 134 - 144 mmol/L   Potassium 3.8 3.5 - 5.2 mmol/L   Chloride 101 96 - 106 mmol/L   CO2 22 20 - 29 mmol/L   Calcium 9.6 8.6 - 10.2  mg/dL   Total Protein 7.0 6.0 - 8.5 g/dL   Albumin 4.4 3.8 - 4.8 g/dL   Globulin, Total 2.6 1.5 - 4.5 g/dL   Bilirubin Total 0.3 0.0 - 1.2 mg/dL   Alkaline Phosphatase 97 44 - 121 IU/L   AST 13 0 - 40 IU/L   ALT 21 0 - 44 IU/L  Lipid panel     Status: Abnormal   Collection Time: 03/12/23  9:11 AM  Result Value Ref Range   Cholesterol, Total 215 (H) 100 - 199 mg/dL   Triglycerides 062 (HH) 0 - 149 mg/dL   HDL 30 (L) >37 mg/dL   VLDL Cholesterol Cal 100 (H) 5 - 40 mg/dL   LDL Chol Calc (NIH) 85 0 - 99 mg/dL   Chol/HDL Ratio 7.2 (H) 0.0 - 5.0 ratio    Comment:                                   T. Chol/HDL Ratio                                             Men  Women                               1/2 Avg.Risk  3.4    3.3                                   Avg.Risk  5.0    4.4                                2X Avg.Risk  9.6    7.1                                3X Avg.Risk 23.4   11.0   TSH     Status: None   Collection Time: 03/12/23  9:11 AM  Result Value Ref Range   TSH 1.210 0.450 - 4.500 uIU/mL  T4, free     Status: None   Collection Time: 03/12/23  9:11 AM  Result Value Ref Range   Free T4 1.33 0.82 - 1.77 ng/dL  Thyroglobulin antibody     Status: None   Collection Time: 03/12/23  9:11 AM  Result Value Ref Range   Thyroglobulin Antibody <1.0 0.0 - 0.9 IU/mL    Comment: Thyroglobulin Antibody measured by [employer] Methodology It should be noted that the presence of thyroglobulin antibodies may not be pathogenic nor diagnostic, especially at very low levels. The assay manufacturer has found that four percent of individuals without evidence of thyroid disease or autoimmunity will have positive TgAb levels up to 4 IU/mL.   Thyroglobulin Level     Status: None (Preliminary result)   Collection Time: 03/12/23  9:11 AM  Result Value Ref Range  Thyroglobulin (TG-RIA) WILL FOLLOW   HgB A1c     Status: Abnormal   Collection Time: 03/19/23  8:45 AM  Result Value Ref Range    Hemoglobin A1C     HbA1c POC (<> result, manual entry)     HbA1c, POC (prediabetic range)     HbA1c, POC (controlled diabetic range) 8.8 (A) 0.0 - 7.0 %   Lipid Panel     Component Value Date/Time   CHOL 215 (H) 03/12/2023 0911   TRIG 617 (HH) 03/12/2023 0911   HDL 30 (L) 03/12/2023 0911   CHOLHDL 7.2 (H) 03/12/2023 0911   LDLCALC 85 03/12/2023 0911   LABVLDL 100 (H) 03/12/2023 0911      Thyrogen stimulated whole-body scan on 06/21/2016: IMPRESSION: Negative whole-body I 131 scan for recurrent iodine avid thyroid Cancer.  06/14/2016 thyroglobulin antibodies negative, thyroglobulin by IME less than 0.1  06/27/2017 - His previsit ultrasound at an outside facility in [facility] revealed an echogenic focus measuring 1.7cm x 0.7cm x 1.1 cm ( previously 1 cm-1.2 cm in [facility] imaging on 02/01/2016) potentially representing a small amount of residual thyroid tissue versus small lymph node. There is no identifiable tissue on the left, no identifiable isthmus.  August 08, 2018 thyroid/neck ultrasound in [facility]:   FINDINGS: Residual solid nodular isoechoic tissue in the right thyroid noted measures 1.2 x 1.0 x 1.0 cm, previously 1.0 x 0.7 x 0.8 cm. No significant interval change. No new abnormality.  Similar ultrasound findings on February 02, 2018. See above.  September 07, 2018 thyroid stimulated whole-body scan negative for iodine avid distant metastasis.  Thyroglobulin by IMA on October 08, 2018 undetectable.  Thyroid/neck surveillance ultrasound August 20, 2019 IMPRESSION: Stable soft tissue in the right thyroidectomy bed. No significant change from the previous examination. No other evidence for recurrent or residual thyroid tissue.  Thyroid ultrasound on March 08, 2022 IMPRESSION: Ultrasound survey of the neck demonstrates tissue within the right surgical thyroid bed measuring 17 mm x 6 mm x 8 mm, not identified on the prior  ultrasound. Recurrent tissue growth not excluded, and correlation with thyroid lab values may be useful, as well as consideration of imaging such as contrast-enhanced neck CT and/or nuclear medicine study.   CT scan soft tissue neck without contrast on September 03, 2022  IMPRESSION: 1. Status post total thyroidectomy. No definite CT correlate is visualized for soft tissue seen on thyroid ultrasound dated 03/08/2022. Non visualization may be due to a combination of small size and noncontrast enhanced CT technique. 2. No evidence of nodal or distant metastatic disease.    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 on July 17, 2012, thyrogen stimulated RAI thyroid remnant ablation given his BRAF positive presentation.  The post therapy scan was negative for distant metastasis. His May 2014 thyroid ultrasound is negative for remnant tissue.  Subsequent surveillance thyrogen stimulated WBS on 10/25/13 was c/w successful ablation of thyroid remnants, with no evidence of local recurrence nor metastatic disease.  His repeat thyroid ultrasound on 10/11/14 showed no residual thyroid tissue. However, on his last surveillance thyroid/neck ultrasound she was found to have a new 9mm residual/recurrent soft tissue nodule in the right  thyroidectomy bed. On subsequent surveillance thyroid ultrasound this tissue was found to be larger at 12 mm. Attempt  to biopsy this up to [facility] was not diagnostic.   - On thyroid/neck ultrasound before this visit on 02/01/2016 showed that the remnant thyroid tissue decreased in size to 1.0 cm, all other  findings unchanged.  There was no sign of residual thyroid tissue on whole-body scan performed 2015.  She is thyroglobulin by IMA was less than 0.1 associated with negative thyroglobulin antibodies on 06/14/2016.  - Thyrogen  stimulated whole-body scan on 06/21/2016 was negative for iodine avid metastatic thyroid cancer.   - thyroid/neck ultrasound on 06/27/2017 showed persistence of possibly thyroid tissue in the right thyroid lobe bed- this was done within a different facility than his prior imaging studies-subject to variations of measurements.  -  thyroid/neck ultrasound in [facility] in June 2019- shows stable finding of 1.2 cm nodular area within the right lobectomy bed. No intervention at this time. His  whole-body scan after Thyrogen stimulation was negative for distant metastasis on September 07, 2018.    Surveillance thyroid/neck ultrasound on January 2021 Stable soft tissue in the right thyroidectomy bed. No significant change from the previous examination. No other evidence for recurrent or residual thyroid tissue. -His August 2022  thyroid/neck ultrasound was unremarkable.  He is currently 9 years out from his original surgery.    Previsit thyroid ultrasound on March 08, 2022 showed that his previously documented tissue on the right thyroid bed is still present measuring 17 mm.  Per radiology recommendation, he underwent CT soft tissue/neck without contrast due to his history of reaction to contrast .  There was no CT correlate for the area of concern, no evidence of distant metastasis. He will be continued on expectant management at this time.  2. Postsurgical hypothyroidism -His previsit labs are consistent with appropriate replacement.  He is advised to continue levothyroxine 150 mcg p.o. daily before breakfast.     - We discussed about the correct intake of his thyroid hormone, on empty stomach at fasting, with water, separated by at least 30 minutes from breakfast and other medications,  and separated by more than 4 hours from calcium, iron, multivitamins, acid reflux medications (PPIs). -Patient is made aware of the fact that thyroid hormone replacement is needed for life, dose to be  adjusted by periodic monitoring of thyroid function tests.   3. Uncontrolled type 2 diabetes mellitus with complication, with long-term current use of insulin (HCC) - His engagement with diabetes management is suboptimal.  His CGM device shows only 39% time range, 48% every 1 hyperglycemia, 30% able to hyperglycemia.  His point-of-care A1c is 8.8%.  He has no hypoglycemia.    He will continue to need intensive treatment with basal/bolus insulin.  He is advised to increase Toujeo to 90 units nightly, advised to increase Humalog to 14 -  20 units 3 times daily AC for Premeal blood glucose readings above 90 mg per DL.  He will continue to need metformin 500 mg p.o. twice daily.  He is tolerating Jardiance, advised to continue Jardiance 10 mg daily at breakfast.  He has severe hypertriglyceridemia, not a good candidate for GLP-1 receptor agonist at this time. Due to his multiple manifestations of metabolic syndrome, he is still a good candidate for lifestyle medicine. - he acknowledges that there is a room for improvement in his food and drink choices. - Suggestion is made for him to avoid simple carbohydrates  from his diet including Cakes, Sweet Desserts, Ice Cream, Soda (diet and regular), Sweet Tea, Candies, Chips, Cookies, Store Bought Juices,  Alcohol , Artificial Sweeteners,  Coffee Creamer, and "Sugar-free" Products, Lemonade. This will help patient to have more stable blood glucose profile and potentially avoid unintended weight gain.  The following Lifestyle Medicine recommendations according to American College of Lifestyle Medicine  [facility]) were discussed and and offered to patient and he  agrees to start the journey:  A. Whole Foods, Plant-Based Nutrition comprising of fruits and vegetables, plant-based proteins, whole-grain carbohydrates was discussed in detail with the patient.   A list for source of those nutrients were also provided to the patient.  Patient will use only water or  unsweetened tea for hydration. B.  The need to stay away from risky substances including alcohol, smoking; obtaining 7 to 9 hours of restorative sleep, at least 150 minutes of moderate intensity exercise weekly, the importance of healthy social connections,  and stress management techniques were discussed. C.  A full color page of  Calorie density of various food groups per pound showing examples of each food groups was provided to the patient.   4. Hyperlipidemia:  His lipid panel still abnormal with LDL 85 improving from 155, however patient has severe hypertriglyceridemia at 617.  Plan based hold food dietary pattern was discussed with him 1 more time.  He is advised to continue fenofibrate 160 mg p.o. daily at bedtime.  He does not tolerate statins.  He did not get appropriate coverage for Repatha.     5. Essential hypertension: -His blood pressure is controlled to target.  He is advised to continue on his current medications including lisinopril/HCTZ 20/12.5 mg p.o. daily at breakfast.    His diabetes foot exam was normal today.  I advised patient to maintain close follow up with his PCP for primary care needs.   I spent  41  minutes in the care of the patient today including review of labs from CMP, Lipids, Thyroid Function, Hematology (current and previous including abstractions from other facilities); face-to-face time discussing  his blood glucose readings/logs, discussing hypoglycemia and hyperglycemia episodes and symptoms, medications doses, his options of short and long term treatment based on the latest standards of care / guidelines;  discussion about incorporating lifestyle medicine;  and documenting the encounter. Risk reduction counseling performed per USPSTF guidelines to reduce  obesity and cardiovascular risk factors.     Please refer to Patient Instructions for Blood Glucose Monitoring and Insulin/Medications Dosing Guide"  in media tab for additional information. Please   also refer to " Patient Self Inventory" in the Media  tab for reviewed elements of pertinent patient history.  Wendi Maya participated in the discussions, expressed understanding, and voiced agreement with the above plans.  All questions were answered to his satisfaction. he is encouraged to contact clinic should he have any questions or concerns prior to his return visit.  Follow up plan: Return in about 4 months (around 07/19/2023) for Bring Meter/CGM Device/Logs- A1c in Office.  Marquis Lunch, MD Phone: [phone]  Fax: [phone]   This note was partially dictated with voice recognition software. Similar sounding words can be transcribed inadequately or may not  be corrected upon review.  03/19/2023, 5:36 PM

## 2023-03-19 NOTE — Patient Instructions (Signed)
                                     Advice for Weight Management  -For most of us the best way to lose weight is by diet management. Generally speaking, diet management means consuming less calories intentionally which over time brings about progressive weight loss.  This can be achieved more effectively by avoiding ultra processed carbohydrates, processed meats, unhealthy fats.    It is critically important to know your numbers: how much calorie you are consuming and how much calorie you need. More importantly, our carbohydrates sources should be unprocessed naturally occurring  complex starch food items.  It is always important to balance nutrition also by  appropriate intake of proteins (mainly plant-based), healthy fats/oils, plenty of fruits and vegetables.   -The American College of Lifestyle Medicine (ACL M) recommends nutrition derived mostly from Whole Food, Plant Predominant Sources example an apple instead of applesauce or apple pie. Eat Plenty of vegetables, Mushrooms, fruits, Legumes, Whole Grains, Nuts, seeds in lieu of processed meats, processed snacks/pastries red meat, poultry, eggs.  Use only water or unsweetened tea for hydration.  The College also recommends the need to stay away from risky substances including alcohol, smoking; obtaining 7-9 hours of restorative sleep, at least 150 minutes of moderate intensity exercise weekly, importance of healthy social connections, and being mindful of stress and seek help when it is overwhelming.    -Sticking to a routine mealtime to eat 3 meals a day and avoiding unnecessary snacks is shown to have a big role in weight control. Under normal circumstances, the only time we burn stored energy is when we are hungry, so allow  some hunger to take place- hunger means no food between appropriate meal times, only water.  It is not advisable to starve.   -It is better to avoid simple carbohydrates including:  Cakes, Sweet Desserts, Ice Cream, Soda (diet and regular), Sweet Tea, Candies, Chips, Cookies, Store Bought Juices, Alcohol in Excess of  1-2 drinks a day, Lemonade,  Artificial Sweeteners, Doughnuts, Coffee Creamers, "Sugar-free" Products, etc, etc.  This is not a complete list.....    -Consulting with certified diabetes educators is proven to provide you with the most accurate and current information on diet.  Also, you may be  interested in discussing diet options/exchanges , we can schedule a visit with Dean Hurley, RDN, CDE for individualized nutrition education.  -Exercise: If you are able: 30 -60 minutes a day ,4 days a week, or 150 minutes of moderate intensity exercise weekly.    The longer the better if tolerated.  Combine stretch, strength, and aerobic activities.  If you were told in the past that you have high risk for cardiovascular diseases, or if you are currently symptomatic, you may seek evaluation by your heart doctor prior to initiating moderate to intense exercise programs.                                  Additional Care Considerations for Diabetes/Prediabetes   -Diabetes  is a chronic disease.  The most important care consideration is regular follow-up with your diabetes care provider with the goal being avoiding or delaying its complications and to take advantage of advances in medications and technology.  If appropriate actions are taken early enough, type 2 diabetes can even be   reversed.  Seek information from the right source.  - Whole Food, Plant Predominant Nutrition is highly recommended: Eat Plenty of vegetables, Mushrooms, fruits, Legumes, Whole Grains, Nuts, seeds in lieu of processed meats, processed snacks/pastries red meat, poultry, eggs as recommended by American College of  Lifestyle Medicine (ACLM).  -Type 2 diabetes is known to coexist with other important comorbidities such as high blood pressure and high cholesterol.  It is critical to control not only the  diabetes but also the high blood pressure and high cholesterol to minimize and delay the risk of complications including coronary artery disease, stroke, amputations, blindness, etc.  The good news is that this diet recommendation for type 2 diabetes is also very helpful for managing high cholesterol and high blood blood pressure.  - Studies showed that people with diabetes will benefit from a class of medications known as ACE inhibitors and statins.  Unless there are specific reasons not to be on these medications, the standard of care is to consider getting one from these groups of medications at an optimal doses.  These medications are generally considered safe and proven to help protect the heart and the kidneys.    - People with diabetes are encouraged to initiate and maintain regular follow-up with eye doctors, foot doctors, dentists , and if necessary heart and kidney doctors.     - It is highly recommended that people with diabetes quit smoking or stay away from smoking, and get yearly  flu vaccine and pneumonia vaccine at least every 5 years.  See above for additional recommendations on exercise, sleep, stress management , and healthy social connections.      

## 2023-06-02 ENCOUNTER — Other Ambulatory Visit: Payer: Self-pay | Admitting: "Endocrinology

## 2023-07-23 ENCOUNTER — Encounter: Payer: Self-pay | Admitting: "Endocrinology

## 2023-07-23 ENCOUNTER — Ambulatory Visit (INDEPENDENT_AMBULATORY_CARE_PROVIDER_SITE_OTHER): Payer: Medicare Other | Admitting: "Endocrinology

## 2023-07-23 VITALS — BP 132/76 | HR 72 | Ht 67.0 in | Wt 203.8 lb

## 2023-07-23 DIAGNOSIS — Z794 Long term (current) use of insulin: Secondary | ICD-10-CM

## 2023-07-23 DIAGNOSIS — E782 Mixed hyperlipidemia: Secondary | ICD-10-CM | POA: Diagnosis not present

## 2023-07-23 DIAGNOSIS — Z91199 Patient's noncompliance with other medical treatment and regimen due to unspecified reason: Secondary | ICD-10-CM

## 2023-07-23 DIAGNOSIS — E890 Postprocedural hypothyroidism: Secondary | ICD-10-CM

## 2023-07-23 DIAGNOSIS — E1169 Type 2 diabetes mellitus with other specified complication: Secondary | ICD-10-CM | POA: Diagnosis not present

## 2023-07-23 DIAGNOSIS — Z85850 Personal history of malignant neoplasm of thyroid: Secondary | ICD-10-CM | POA: Insufficient documentation

## 2023-07-23 DIAGNOSIS — I10 Essential (primary) hypertension: Secondary | ICD-10-CM

## 2023-07-23 LAB — POCT GLYCOSYLATED HEMOGLOBIN (HGB A1C): HbA1c, POC (controlled diabetic range): 9.4 % — AB (ref 0.0–7.0)

## 2023-07-23 MED ORDER — TOUJEO MAX SOLOSTAR 300 UNIT/ML ~~LOC~~ SOPN: 100.0000 [IU] | PEN_INJECTOR | Freq: Every evening | SUBCUTANEOUS | 1 refills | Status: DC

## 2023-07-23 MED ORDER — ICOSAPENT ETHYL 1 G PO CAPS: 2.0000 g | ORAL_CAPSULE | Freq: Two times a day (BID) | ORAL | 1 refills | Status: AC

## 2023-07-23 MED ORDER — LEVOTHYROXINE SODIUM 175 MCG PO TABS: ORAL_TABLET | ORAL | 1 refills | Status: DC

## 2023-07-23 MED ORDER — INSULIN LISPRO (1 UNIT DIAL) 100 UNIT/ML (KWIKPEN): 14.0000 [IU] | PEN_INJECTOR | Freq: Three times a day (TID) | SUBCUTANEOUS | 2 refills | Status: DC

## 2023-07-23 NOTE — Progress Notes (Signed)
07/23/2023  Endocrinology follow-up note   Subjective:    Patient ID: Dean Hurley, male    DOB: Mar 26, 1951,   Chief complaint  Patient is here to follow-up for 1) History of thyroid malignancy 2) postsurgical hypothyroidism 3)  Uncontrolled type 2 diabetes  4) hyperlipidemia 5) hypertension   Past Medical History:  Diagnosis Date   Cancer (HCC)    Diabetes mellitus without complication (HCC)    Hypertension    Thyroid cancer (HCC)    Past Surgical History:  Procedure Laterality Date   BACK SURGERY     BLADDER SURGERY  2024   THYROID SURGERY     Thyroidectomy   TRANSURETHRAL RESECTION OF PROSTATE     Social History   Socioeconomic History   Marital status: Married    Spouse name: Not on file   Number of children: Not on file   Years of education: Not on file   Highest education level: Not on file  Occupational History   Not on file  Tobacco Use   Smoking status: Former    Types: Cigarettes   Smokeless tobacco: Never  Vaping Use   Vaping status: Never Used  Substance and Sexual Activity   Alcohol use: No    Alcohol/week: 0.0 standard drinks of alcohol   Drug use: No   Sexual activity: Not on file  Other Topics Concern   Not on file  Social History Narrative   Not on file   Social Determinants of Health   Financial Resource Strain: Not on file  Food Insecurity: Not on file  Transportation Needs: Not on file  Physical Activity: Not on file  Stress: Not on file  Social Connections: Not on file   Outpatient Encounter Medications as of 07/23/2023  Medication Sig   icosapent Ethyl (VASCEPA) 1 g capsule Take 2 capsules (2 g total) by mouth 2 (two) times daily.   alum & mag hydroxide-simeth (MAALOX/MYLANTA) 200-200-20 MG/5ML suspension Take 20 mLs by mouth every 6 (six) hours as needed for heartburn.   B-D ULTRAFINE III SHORT PEN 31G X 8 MM MISC USE WITH INSULIN FOUR TIMES A DAY   Coenzyme Q10 (COQ10 PO) Take 1 tablet by mouth daily  in the afternoon.   Continuous Blood Gluc Receiver (FREESTYLE LIBRE 2 READER) DEVI Use as directed to check glucose   Continuous Blood Gluc Receiver (FREESTYLE LIBRE 2 READER) DEVI As directed   insulin glargine, 2 Unit Dial, (TOUJEO MAX SOLOSTAR) 300 UNIT/ML Solostar Pen Inject 100 Units into the skin at bedtime.   insulin lispro (HUMALOG) 100 UNIT/ML KwikPen Inject 14-20 Units into the skin 3 (three) times daily before meals.   IRON, FERROUS SULFATE, PO Take 1 tablet by mouth daily in the afternoon.   levothyroxine (SYNTHROID) 175 MCG tablet TAKE 1 TABLET BY MOUTH ONCE DAILY BEFORE BREAKFAST   losartan-hydrochlorothiazide (HYZAAR) 50-12.5 MG tablet Take 1 tablet by mouth daily.   meclizine (ANTIVERT) 25 MG tablet Take 25 mg by mouth 3 (three) times daily as needed for dizziness.   metFORMIN (GLUCOPHAGE) 500 MG tablet Take 1 tablet (500 mg total) by mouth 2 (two) times daily with a meal.   triamcinolone cream (KENALOG) 0.1 % Apply 1 application topically daily as needed (for scaly skin).    [DISCONTINUED] diclofenac (VOLTAREN) 75 MG EC tablet Take 75 mg by mouth 2 (two) times daily.   [DISCONTINUED] empagliflozin (JARDIANCE) 10 MG TABS tablet Take 1  tablet (10 mg total) by mouth daily before breakfast. (Patient not taking: Reported on 07/23/2023)   [DISCONTINUED] fenofibrate 160 MG tablet Take 160 mg by mouth daily.   [DISCONTINUED] insulin glargine, 2 Unit Dial, (TOUJEO MAX SOLOSTAR) 300 UNIT/ML Solostar Pen Inject 90 Units into the skin at bedtime.   [DISCONTINUED] insulin lispro (HUMALOG) 100 UNIT/ML KwikPen Inject 14-20 Units into the skin 3 (three) times daily before meals. (Patient taking differently: Inject 12-18 Units into the skin 3 (three) times daily before meals.)   [DISCONTINUED] levothyroxine (SYNTHROID) 150 MCG tablet TAKE 1 TABLET BY MOUTH ONCE DAILY BEFORE BREAKFAST (Patient taking differently: Take 162 mcg by mouth daily before breakfast. TAKE 1 TABLET BY MOUTH ONCE DAILY BEFORE  BREAKFAST)   No facility-administered encounter medications on file as of 07/23/2023.   ALLERGIES: Allergies  Allergen Reactions   Codeine Other (See Comments)    Feels faint, flushed almost passed out almost passed out    Contrast Media [Iodinated Contrast Media]    VACCINATION STATUS: Immunization History  Administered Date(s) Administered   Moderna Sars-Covid-2 Vaccination 10/26/2019, 11/23/2019, 06/28/2020, 02/05/2021    Diabetes    72 year old male with multiple medical problems. He underwent total thyroidectomy in [facility] on June 02, 2012.  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 1.7 cm cancer nodule on the right lobe and 0.4 cm cancer nodule on the left lobe. Partial invasion of capsule, margins negative. He underwent thyrogen stimulated I131  thyroid remnant ablation with negative whole body scan for distant metastasis on July 17, 2013.    May 2014 thyroid /neck ultrasound :  negative for any mass lesion.   Thyrogen stimulated WBS on 10/25/13 was c/w successful ablation of thyroid remnants with no evidence of local recurrence or metastatic disease. He has  had another thyroid ultrasound on 10/11/14 which was negative for residual thyroid tissue. On his next  surveillance thyroid ultrasound on September 6,2016 however he has had a 9 mm residual tissue on the  right thyroid bed, which grew to 12 mm in follow-up ultrasound in December 2016. He was sent to [facility] where he had his prior surgery. Fine needle  aspiration of the residual tissue did not reveal diagnostic material.  - On thyroid/neck ultrasound  on 02/01/2016 showed that the remnant thyroid tissue decreased in size to 1.0 cm, all other  findings unchanged.  - His last  Thyrogen stimulated whole-body scan on  06/21/2016 was  reported to be negative for any iodine avid metastatic thyroid cancer.  - On 06/27/2017 : His  ultrasound at an outside facility in  [facility] revealed an echogenic focus measuring 1.7cm x 0.7cm x 1.1 cm ( previously 1 cm-1.2 cm in [facility] imaging on 02/01/2016) potentially representing a small amount of residual thyroid tissue versus small lymph node. There is no identifiable tissue on the left, no identifiable isthmus.  -He pushed to get his previsit ultrasound prematurely on August 08, 2017 which showed no significant change in the thyroid remnant at 1.2 cm.  -His most recent  repeat surveillance thyroid/neck ultrasound on February 02, 2018 is unchanged showing stable 1.2 cm nodular tissue ithin the right lobectomy bed, unchanged since December 2016.   September 07, 2018: Thyrogen stimulated whole-body scan negative for any iodine avid metastatic thyroid cancer. -See his previous visit notes. - surveillance thyroid/neck ultrasound on August 20, 2019 which revealed stable 1.2 cm thyroid remnant tissue on the right thyroid bed.  No additional or  new findings.   His thyroid/neck ultrasound on April 03, 2021 showed surgical changes of total thyroidectomy, no measurable residual thyroid tissue identified on the current study.  His most recent thyroid ultrasound on March 08, 2022 showed 17 mm x 6 mm x 8 mm tissue within the right surgical thyroid gait, not identified on prior ultrasound.  Due to patient's history of contrast allergy, previsit CT was done without contrast showing no definite CT correlate for soft tissue was seen.  Nonvisualization may be due to a combination of small size and noncontrast enhanced CT technique.   There was no evidence of nodal or distant metastatic disease.    His previsit labs from June 19, 2023 showed thyroglobulin 0.1 associated with undetectable thyroglobulin antibodies.  He denies any dysphagia, shortness of breath, nor voice change. His interval thyroid function test were consistent with under replacement, which led to adjustment of his thyroid hormone to 162 mcg.     For his uncontrolled type 2 diabetes, for which he is on multiple daily injections of insulin along with metformin.  He presents with worsening glycemic profile and point-of-care A1c of 9.4% increasing from 8.8% during his last visit.     His recent labs show worsening her lipid panel.  His insurance did not provide coverage for Repatha, patient continues to avoid statins due to side effects.  His engagement with whole food plant-based lifestyle nutrition is suboptimal.  He was advised by his nephrologist to come off of fenofibrate.     Review of Systems Limited as above.   Objective:    BP 132/76   Pulse 72   Ht 5\' 7"  (1.702 m)   Wt 203 lb 12.8 oz (92.4 kg)   BMI 31.92 kg/m   Wt Readings from Last 3 Encounters:  07/23/23 203 lb 12.8 oz (92.4 kg)  03/19/23 202 lb (91.6 kg)  12/03/22 196 lb 3.2 oz (89 kg)    Physical Exam          Latest Ref Rng & Units 03/12/2023    9:11 AM 09/10/2022   12:05 PM 06/03/2022   10:54 AM  CMP  Glucose 70 - 99 mg/dL 308  657  846   BUN 8 - 27 mg/dL 21  17  20    Creatinine 0.76 - 1.27 mg/dL 9.62  9.52  8.41   Sodium 134 - 144 mmol/L 140  140  138   Potassium 3.5 - 5.2 mmol/L 3.8  4.2  4.6   Chloride 96 - 106 mmol/L 101  101  102   CO2 20 - 29 mmol/L 22  22  22    Calcium 8.6 - 10.2 mg/dL 9.6  9.9  9.8   Total Protein 6.0 - 8.5 g/dL 7.0  7.2  7.0   Total Bilirubin 0.0 - 1.2 mg/dL 0.3  0.5  0.2   Alkaline Phos 44 - 121 IU/L 97  95  86   AST 0 - 40 IU/L 13  17  21    ALT 0 - 44 IU/L 21  25  25     Recent Results (from the past 2160 hour(s))  HgB A1c     Status: Abnormal   Collection Time: 07/23/23  9:02 AM  Result Value Ref Range   Hemoglobin A1C     HbA1c POC (<> result, manual entry)     HbA1c, POC (prediabetic range)     HbA1c, POC (controlled diabetic range) 9.4 (A) 0.0 - 7.0 %   Lipid Panel  Component Value Date/Time   CHOL 215 (H) 03/12/2023 0911   TRIG 617 (HH) 03/12/2023 0911   HDL 30 (L) 03/12/2023 0911   CHOLHDL  7.2 (H) 03/12/2023 0911   LDLCALC 85 03/12/2023 0911   LABVLDL 100 (H) 03/12/2023 0911   0.1   Thyroglobulin Antibody Screen Negative Negative  Thyroglobulin Antibody <4.0 IU/mL <0.9     Thyrogen stimulated whole-body scan on 06/21/2016: IMPRESSION: Negative whole-body I 131 scan for recurrent iodine avid thyroid Cancer.  06/14/2016 thyroglobulin antibodies negative, thyroglobulin by IME less than 0.1  06/27/2017 - His previsit ultrasound at an outside facility in [facility] revealed an echogenic focus measuring 1.7cm x 0.7cm x 1.1 cm ( previously 1 cm-1.2 cm in [facility] imaging on 02/01/2016) potentially representing a small amount of residual thyroid tissue versus small lymph node. There is no identifiable tissue on the left, no identifiable isthmus.  August 08, 2018 thyroid/neck ultrasound in [facility]:   FINDINGS: Residual solid nodular isoechoic tissue in the right thyroid noted measures 1.2 x 1.0 x 1.0 cm, previously 1.0 x 0.7 x 0.8 cm. No significant interval change. No new abnormality.  Similar ultrasound findings on February 02, 2018. See above.  September 07, 2018 thyroid stimulated whole-body scan negative for iodine avid distant metastasis.  Thyroglobulin by IMA on October 08, 2018 undetectable.  Thyroid/neck surveillance ultrasound August 20, 2019 IMPRESSION: Stable soft tissue in the right thyroidectomy bed. No significant change from the previous examination. No other evidence for recurrent or residual thyroid tissue.  Thyroid ultrasound on March 08, 2022 IMPRESSION: Ultrasound survey of the neck demonstrates tissue within the right surgical thyroid bed measuring 17 mm x 6 mm x 8 mm, not identified on the prior ultrasound. Recurrent tissue growth not excluded, and correlation with thyroid lab values may be useful, as well as consideration of imaging such as contrast-enhanced neck CT and/or nuclear medicine study.   CT  scan soft tissue neck without contrast on September 03, 2022  IMPRESSION: 1. Status post total thyroidectomy. No definite CT correlate is visualized for soft tissue seen on thyroid ultrasound dated 03/08/2022. Non visualization may be due to a combination of small size and noncontrast enhanced CT technique. 2. No evidence of nodal or distant metastatic disease.    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 on July 17, 2012, thyrogen stimulated RAI thyroid remnant ablation given his BRAF positive presentation.  The post therapy scan was negative for distant metastasis. His May 2014 thyroid ultrasound is negative for remnant tissue.  Subsequent surveillance thyrogen stimulated WBS on 10/25/13 was c/w successful ablation of thyroid remnants, with no evidence of local recurrence nor metastatic disease.  His repeat thyroid ultrasound on 10/11/14 showed no residual thyroid tissue. However, on his last surveillance thyroid/neck ultrasound she was found to have a new 9mm residual/recurrent soft tissue nodule in the right  thyroidectomy bed. On subsequent surveillance thyroid ultrasound this tissue was found to be larger at 12 mm. Attempt to biopsy this up to [facility] was not diagnostic.   - On thyroid/neck ultrasound before this visit on 02/01/2016 showed that the remnant thyroid tissue decreased in size to 1.0 cm, all other  findings unchanged.  There was no sign of residual thyroid tissue on whole-body scan performed 2015.  She is thyroglobulin by IMA  was less than 0.1 associated with negative thyroglobulin antibodies on 06/14/2016.  - Thyrogen stimulated whole-body scan on 06/21/2016 was negative for iodine avid metastatic thyroid cancer.   - thyroid/neck ultrasound on 06/27/2017 showed persistence of possibly thyroid tissue in the right thyroid lobe  bed- this was done within a different facility than his prior imaging studies-subject to variations of measurements.  -  thyroid/neck ultrasound in [facility] in June 2019- shows stable finding of 1.2 cm nodular area within the right lobectomy bed. No intervention at this time. His  whole-body scan after Thyrogen stimulation was negative for distant metastasis on September 07, 2018.    Surveillance thyroid/neck ultrasound on January 2021 Stable soft tissue in the right thyroidectomy bed. No significant change from the previous examination. No other evidence for recurrent or residual thyroid tissue. -His August 2022  thyroid/neck ultrasound was unremarkable.  He is currently 9 years out from his original surgery.    Previsit thyroid ultrasound on March 08, 2022 showed that his previously documented tissue on the right thyroid bed is still present measuring 17 mm.  Per radiology recommendation, he underwent CT soft tissue/neck without contrast due to his history of reaction to contrast .  There was no CT correlate for the area of concern, no evidence of distant metastasis. -His previsit labs show thyroglobulin of 0.1, thyroglobulin less than 0.9. He will be continued on expectant management at this time.  2. Postsurgical hypothyroidism -His previsit labs are consistent with under replacement.  I discussed and increased his levothyroxine to 175 mcg p.o. daily before breakfast.   - We discussed about the correct intake of his thyroid hormone, on empty stomach at fasting, with water, separated by at least 30 minutes from breakfast and other medications,  and separated by more than 4 hours from calcium, iron, multivitamins, acid reflux medications (PPIs). -Patient is made aware of the fact that thyroid hormone replacement is needed for life, dose to be adjusted by periodic monitoring of thyroid function tests.    3. Uncontrolled type 2 diabetes mellitus with complication, with long-term  current use of insulin (HCC) - His engagement with diabetes management is suboptimal.  His CGM device shows 34% time in range, 43% Libre 1 hyperglycemia, 23% Libre to hyperglycemia.   His point-of-care A1c is 9.4% increasing from 8.8%.  He will continue to need intensive treatment with basal/bolus insulin.  He is advised to increase Toujeo to 100 units nightly, advised to increase Humalog to 14-20  units 3 times daily AC for Premeal blood glucose readings above 90 mg per DL. -His insurance did not provide coverage for Jardiance.  He is not a candidate for GLP-1 receptor agonist.  He will continue to need metformin 500 mg p.o. twice daily.  He has severe hypertriglyceridemia, putting him at risk for pancreatitis.  See below.   - he acknowledges that there is a room for improvement in his food and drink choices. - Suggestion is made for him to avoid simple carbohydrates  from his diet including Cakes, Sweet Desserts, Ice Cream, Soda (diet and regular), Sweet Tea, Candies, Chips, Cookies, Store Bought Juices, Alcohol , Artificial Sweeteners,  Coffee Creamer, and "Sugar-free" Products, Lemonade. This will help patient to have more stable blood glucose profile and potentially avoid unintended weight gain.  The following Lifestyle Medicine recommendations according to American College of Lifestyle Medicine  [facility]) were discussed and and offered to patient and he  agrees to start the journey:  A. Whole Foods, Plant-Based  Nutrition comprising of fruits and vegetables, plant-based proteins, whole-grain carbohydrates was discussed in detail with the patient.   A list for source of those nutrients were also provided to the patient.  Patient will use only water or unsweetened tea for hydration. B.  The need to stay away from risky substances including alcohol, smoking; obtaining 7 to 9 hours of restorative sleep, at least 150 minutes of moderate intensity exercise weekly, the importance of healthy social  connections,  and stress management techniques were discussed. C.  A full color page of  Calorie density of various food groups per pound showing examples of each food groups was provided to the patient.   4. Hyperlipidemia:  His lipid panel still abnormal with LDL 85 improving from 155, however patient has severe hypertriglyceridemia at 617.  Plan based hold food dietary pattern was discussed with him 1 more time.  He is off of fenofibrate due to renal concerns.  I discussed and initiated Vascepa 2 g p.o. twice daily.  He did not get coverage for Repatha.    5. Essential hypertension: His blood pressure is controlled to target.  He is advised to continue on his current medications including lisinopril/HCTZ 20/12.5 mg p.o. daily at breakfast.    I advised patient to maintain close follow up with his PCP for primary care needs.   I spent  42  minutes in the care of the patient today including review of labs from CMP, Lipids, Thyroid Function, Hematology (current and previous including abstractions from other facilities); face-to-face time discussing  his blood glucose readings/logs, discussing hypoglycemia and hyperglycemia episodes and symptoms, medications doses, his options of short and long term treatment based on the latest standards of care / guidelines;  discussion about incorporating lifestyle medicine;  and documenting the encounter. Risk reduction counseling performed per USPSTF guidelines to reduce  obesity and cardiovascular risk factors.     Please refer to Patient Instructions for Blood Glucose Monitoring and Insulin/Medications Dosing Guide"  in media tab for additional information. Please  also refer to " Patient Self Inventory" in the Media  tab for reviewed elements of pertinent patient history.  Wendi Maya participated in the discussions, expressed understanding, and voiced agreement with the above plans.  All questions were answered to his satisfaction. he is encouraged to  contact clinic should he have any questions or concerns prior to his return visit.   Follow up plan: Return in about 4 months (around 11/21/2023) for F/U with Pre-visit Labs, Meter/CGM/Logs, A1c here.  Dean Lunch, MD Phone: [phone]  Fax: [phone]   This note was partially dictated with voice recognition software. Similar sounding words can be transcribed inadequately or may not  be corrected upon review.  07/23/2023, 12:37 PM

## 2023-07-23 NOTE — Patient Instructions (Signed)

## 2023-09-05 ENCOUNTER — Other Ambulatory Visit: Payer: Self-pay | Admitting: "Endocrinology

## 2023-09-05 DIAGNOSIS — Z91199 Patient's noncompliance with other medical treatment and regimen due to unspecified reason: Secondary | ICD-10-CM

## 2023-11-12 ENCOUNTER — Telehealth: Payer: Self-pay | Admitting: Pharmacy Technician

## 2023-11-12 ENCOUNTER — Other Ambulatory Visit (HOSPITAL_COMMUNITY): Payer: Self-pay

## 2023-11-12 NOTE — Telephone Encounter (Signed)
 Pharmacy Patient Advocate Encounter   Received notification from CoverMyMeds that prior authorization for Insulin Lispro (1 Unit Dial) 100UNIT/ML pen-injectors is due for renewal.   Insurance verification completed.   The patient is insured through  [employer] .  Action: Medication is now available without a prior authorization.

## 2023-11-19 ENCOUNTER — Other Ambulatory Visit: Payer: Self-pay

## 2023-11-19 DIAGNOSIS — E1169 Type 2 diabetes mellitus with other specified complication: Secondary | ICD-10-CM

## 2023-11-19 MED ORDER — TOUJEO MAX SOLOSTAR 300 UNIT/ML ~~LOC~~ SOPN: 100.0000 [IU] | PEN_INJECTOR | Freq: Every day | SUBCUTANEOUS | 0 refills | Status: DC

## 2023-11-25 ENCOUNTER — Ambulatory Visit: Payer: Medicare Other | Admitting: "Endocrinology

## 2023-11-29 LAB — THYROGLOBULIN LEVEL: Thyroglobulin (TG-RIA): <2 ng/mL

## 2023-11-29 LAB — COMPREHENSIVE METABOLIC PANEL WITH GFR
ALT: 26 IU/L (ref 0–44)
AST: 19 IU/L (ref 0–40)
Albumin: 4.4 g/dL (ref 3.8–4.8)
Alkaline Phosphatase: 105 IU/L (ref 44–121)
BUN/Creatinine Ratio: 19 (ref 10–24)
BUN: 21 mg/dL (ref 8–27)
Bilirubin Total: 0.5 mg/dL (ref 0.0–1.2)
CO2: 19 mmol/L — ABNORMAL LOW (ref 20–29)
Calcium: 9.7 mg/dL (ref 8.6–10.2)
Chloride: 100 mmol/L (ref 96–106)
Creatinine, Ser: 1.13 mg/dL (ref 0.76–1.27)
Globulin, Total: 3 g/dL (ref 1.5–4.5)
Glucose: 215 mg/dL — ABNORMAL HIGH (ref 70–99)
Potassium: 4.3 mmol/L (ref 3.5–5.2)
Sodium: 139 mmol/L (ref 134–144)
Total Protein: 7.4 g/dL (ref 6.0–8.5)
eGFR: 69 mL/min/{1.73_m2} (ref >59)

## 2023-11-29 LAB — TSH: TSH: 5.93 u[IU]/mL — ABNORMAL HIGH (ref 0.450–4.500)

## 2023-11-29 LAB — LIPID PANEL
Chol/HDL Ratio: 6.3 ratio — ABNORMAL HIGH (ref 0.0–5.0)
Cholesterol, Total: 221 mg/dL — ABNORMAL HIGH (ref 100–199)
HDL: 35 mg/dL — ABNORMAL LOW (ref >39)
LDL Chol Calc (NIH): 123 mg/dL — ABNORMAL HIGH (ref 0–99)
Triglycerides: 356 mg/dL — ABNORMAL HIGH (ref 0–149)
VLDL Cholesterol Cal: 63 mg/dL — ABNORMAL HIGH (ref 5–40)

## 2023-11-29 LAB — T4, FREE: Free T4: 1.17 ng/dL (ref 0.82–1.77)

## 2023-11-29 LAB — THYROGLOBULIN ANTIBODY: Thyroglobulin Antibody: <1 [IU]/mL (ref 0.0–0.9)

## 2023-12-17 ENCOUNTER — Ambulatory Visit: Admitting: "Endocrinology

## 2023-12-25 ENCOUNTER — Telehealth: Payer: Self-pay | Admitting: "Endocrinology

## 2023-12-25 ENCOUNTER — Other Ambulatory Visit: Payer: Self-pay | Admitting: "Endocrinology

## 2023-12-25 DIAGNOSIS — E890 Postprocedural hypothyroidism: Secondary | ICD-10-CM

## 2023-12-25 NOTE — Telephone Encounter (Signed)
 Pt called because he missed his appt. He is scheduled for July 1. Do you want repeat labs or will you use what he did ?

## 2023-12-25 NOTE — Telephone Encounter (Signed)
 Mailed to pt

## 2024-02-06 ENCOUNTER — Other Ambulatory Visit: Payer: Self-pay | Admitting: "Endocrinology

## 2024-02-07 LAB — T4, FREE: Free T4: 1.89 ng/dL — ABNORMAL HIGH (ref 0.82–1.77)

## 2024-02-07 LAB — TSH: TSH: 0.099 u[IU]/mL — ABNORMAL LOW (ref 0.450–4.500)

## 2024-02-10 ENCOUNTER — Telehealth: Payer: Self-pay | Admitting: Pharmacy Technician

## 2024-02-10 ENCOUNTER — Encounter: Payer: Self-pay | Admitting: "Endocrinology

## 2024-02-10 ENCOUNTER — Other Ambulatory Visit (HOSPITAL_COMMUNITY): Payer: Self-pay

## 2024-02-10 ENCOUNTER — Ambulatory Visit (INDEPENDENT_AMBULATORY_CARE_PROVIDER_SITE_OTHER): Admitting: "Endocrinology

## 2024-02-10 VITALS — BP 132/74 | HR 64 | Ht 67.0 in | Wt 195.6 lb

## 2024-02-10 DIAGNOSIS — E1169 Type 2 diabetes mellitus with other specified complication: Secondary | ICD-10-CM | POA: Diagnosis not present

## 2024-02-10 DIAGNOSIS — E782 Mixed hyperlipidemia: Secondary | ICD-10-CM | POA: Diagnosis not present

## 2024-02-10 DIAGNOSIS — I10 Essential (primary) hypertension: Secondary | ICD-10-CM

## 2024-02-10 DIAGNOSIS — E890 Postprocedural hypothyroidism: Secondary | ICD-10-CM

## 2024-02-10 DIAGNOSIS — Z794 Long term (current) use of insulin: Secondary | ICD-10-CM

## 2024-02-10 DIAGNOSIS — Z85850 Personal history of malignant neoplasm of thyroid: Secondary | ICD-10-CM

## 2024-02-10 DIAGNOSIS — Z789 Other specified health status: Secondary | ICD-10-CM

## 2024-02-10 LAB — POCT GLYCOSYLATED HEMOGLOBIN (HGB A1C): HbA1c, POC (controlled diabetic range): 8.5 % — AB (ref 0.0–7.0)

## 2024-02-10 MED ORDER — LEVOTHYROXINE SODIUM 150 MCG PO TABS: ORAL_TABLET | ORAL | 1 refills | Status: DC

## 2024-02-10 MED ORDER — TIRZEPATIDE 2.5 MG/0.5ML ~~LOC~~ SOAJ: 2.5000 mg | SUBCUTANEOUS | 0 refills | Status: AC

## 2024-02-10 MED ORDER — EZETIMIBE 10 MG PO TABS: 10.0000 mg | ORAL_TABLET | Freq: Every day | ORAL | 1 refills | Status: AC

## 2024-02-10 NOTE — Telephone Encounter (Signed)
 Pharmacy Patient Advocate Encounter  Received notification from [employer] that Prior Authorization for Mounjaro 2.5MG /0.5ML auto-injectorshas been APPROVED from 11/12/23 to 02/09/25. Ran test claim, Copay is $270.80 for a 1 month supply. This test claim was processed through [facility] copay amounts may vary at other pharmacies due to pharmacy/plan contracts, or as the patient moves through the different stages of their insurance plan.   PA #/Case ID/Reference #: [id number]

## 2024-02-10 NOTE — Patient Instructions (Signed)

## 2024-02-10 NOTE — Progress Notes (Signed)
 02/10/2024  Endocrinology follow-up note   Subjective:    Patient ID: Dean Hurley, male    DOB: 1951/01/03,   Chief complaint  Patient is here to follow-up for 1) History of thyroid  malignancy 2) postsurgical hypothyroidism 3)  Uncontrolled type 2 diabetes  4) hyperlipidemia 5) hypertension   Past Medical History:  Diagnosis Date   Cancer (HCC)    Diabetes mellitus without complication (HCC)    Hypertension    Thyroid  cancer (HCC)    Past Surgical History:  Procedure Laterality Date   BACK SURGERY     BLADDER SURGERY  2024   THYROID  SURGERY     Thyroidectomy   TRANSURETHRAL RESECTION OF PROSTATE     Social History   Socioeconomic History   Marital status: Married    Spouse name: Not on file   Number of children: Not on file   Years of education: Not on file   Highest education level: Not on file  Occupational History   Not on file  Tobacco Use   Smoking status: Former    Types: Cigarettes   Smokeless tobacco: Never  Vaping Use   Vaping status: Never Used  Substance and Sexual Activity   Alcohol use: No    Alcohol/week: 0.0 standard drinks of alcohol   Drug use: No   Sexual activity: Not on file  Other Topics Concern   Not on file  Social History Narrative   Not on file   Social Drivers of Health   Financial Resource Strain: Not on file  Food Insecurity: No Food Insecurity (12/26/2023)   Received from [facility]   Hunger Vital Sign    Within the past 12 months, you worried that your food would run out before you got the money to buy more.: Never true    Within the past 12 months, the food you bought just didn't last and you didn't have money to get more.: Never true  Transportation Needs: No Transportation Needs (08/11/2023)   Received from [facility] - Transportation    In the past 12 months, has lack of transportation kept you from medical appointments or from getting medications?: No    Lack  of Transportation (Non-Medical): No  Physical Activity: Sufficiently Active (12/26/2023)   Received from [facility]   Exercise Vital Sign    On average, how many days per week do you engage in moderate to strenuous exercise (like a brisk walk)?: 3 days    On average, how many minutes do you engage in exercise at this level?: 120 min  Stress: No Stress Concern Present (12/26/2023)   Received from [facility] - Occupational Stress Questionnaire    Feeling of Stress : Not at all  Social Connections: Moderately Integrated (12/26/2023)   Received from [facility]   Social Connection and Isolation Panel    In a typical week, how many times do you talk on the phone with family, friends, or neighbors?: More than three times a week    How often do you get together with friends or relatives?: More than three times a week    How often do you attend church or religious services?: 1 to 4 times per year    Do you belong to any clubs or organizations such as church groups, unions, fraternal or athletic groups, or school groups?:  No    How often do you attend meetings of the clubs or organizations you belong to?: Never    Are you married, widowed, divorced, separated, never married, or living with a partner?: Married   Outpatient Encounter Medications as of 02/10/2024  Medication Sig   ezetimibe (ZETIA) 10 MG tablet Take 1 tablet (10 mg total) by mouth at bedtime.   OVER THE COUNTER MEDICATION Prostagenic 3 tablets po daily   tirzepatide (MOUNJARO) 2.5 MG/0.5ML Pen Inject 2.5 mg into the skin once a week.   alum & mag hydroxide-simeth (MAALOX/MYLANTA) 200-200-20 MG/5ML suspension Take 20 mLs by mouth every 6 (six) hours as needed for heartburn.   B-D ULTRAFINE III SHORT PEN 31G X 8 MM MISC USE WITH INSULIN  FOUR TIMES A DAY   Coenzyme Q10 (COQ10 PO) Take 1 tablet by mouth daily in the afternoon.   Continuous Blood Gluc Receiver (FREESTYLE LIBRE 2 READER)  DEVI Use as directed to check glucose   Continuous Blood Gluc Receiver (FREESTYLE LIBRE 2 READER) DEVI As directed   icosapent  Ethyl (VASCEPA ) 1 g capsule Take 2 capsules (2 g total) by mouth 2 (two) times daily.   insulin  glargine, 2 Unit Dial , (TOUJEO  MAX SOLOSTAR) 300 UNIT/ML Solostar Pen Inject 100 Units into the skin at bedtime.   insulin  lispro (HUMALOG ) 100 UNIT/ML KwikPen Inject 14-20 Units into the skin 3 (three) times daily with meals.   IRON, FERROUS SULFATE, PO Take 1 tablet by mouth daily in the afternoon.   levothyroxine  (SYNTHROID ) 150 MCG tablet TAKE 1 TABLET BY MOUTH ONCE DAILY BEFORE BREAKFAST   losartan-hydrochlorothiazide  (HYZAAR) 50-12.5 MG tablet Take 1 tablet by mouth daily.   meclizine  (ANTIVERT ) 25 MG tablet Take 25 mg by mouth 3 (three) times daily as needed for dizziness.   triamcinolone  cream (KENALOG ) 0.1 % Apply 1 application topically daily as needed (for scaly skin).    [DISCONTINUED] levothyroxine  (SYNTHROID ) 175 MCG tablet TAKE 1 TABLET BY MOUTH ONCE DAILY BEFORE BREAKFAST   [DISCONTINUED] metFORMIN  (GLUCOPHAGE ) 500 MG tablet Take 1 tablet (500 mg total) by mouth 2 (two) times daily with a meal. (Patient not taking: Reported on 02/10/2024)   No facility-administered encounter medications on file as of 02/10/2024.   ALLERGIES: Allergies  Allergen Reactions   Codeine Other (See Comments)    Feels faint, flushed almost passed out almost passed out    Contrast Media [Iodinated Contrast Media]    VACCINATION STATUS: Immunization History  Administered Date(s) Administered   Moderna Sars-Covid-2 Vaccination 10/26/2019, 11/23/2019, 06/28/2020, 02/05/2021    Diabetes    73 year old male with multiple medical problems. He underwent total thyroidectomy in [facility] on June 02, 2012.  His total thyroidectomy showed BRAF positive multicentric thyroid  cancer. 1.7 cm cancer nodule on the right lobe and 0.4 cm cancer nodule on the left lobe.  Partial invasion of capsule, margins negative. He underwent thyrogen  stimulated I131  thyroid  remnant ablation with negative whole body scan for distant metastasis on July 17, 2013.    May 2014 thyroid  /neck ultrasound :  negative for any mass lesion.   Thyrogen  stimulated WBS on 10/25/13 was c/w successful ablation of thyroid  remnants with no evidence of local recurrence or metastatic disease. He has  had another thyroid  ultrasound on 10/11/14 which was negative for residual thyroid  tissue. On his next  surveillance thyroid  ultrasound on September 6,2016 however he has had a 9 mm residual tissue on the  right thyroid  bed, which grew to 12 mm in follow-up  ultrasound in December 2016. He was sent to [facility] where he had his prior surgery. Fine needle  aspiration of the residual tissue did not reveal diagnostic material.  - On thyroid /neck ultrasound  on 02/01/2016 showed that the remnant thyroid  tissue decreased in size to 1.0 cm, all other  findings unchanged.  - His last  Thyrogen  stimulated whole-body scan on  06/21/2016 was  reported to be negative for any iodine avid metastatic thyroid  cancer.  - On 06/27/2017 : His  ultrasound at an outside facility in [facility] revealed an echogenic focus measuring 1.7cm x 0.7cm x 1.1 cm ( previously 1 cm-1.2 cm in [facility] imaging on 02/01/2016) potentially representing a small amount of residual thyroid  tissue versus small lymph node. There is no identifiable tissue on the left, no identifiable isthmus.  -He pushed to get his previsit ultrasound prematurely on August 08, 2017 which showed no significant change in the thyroid  remnant at 1.2 cm.  -His most recent  repeat surveillance thyroid /neck ultrasound on February 02, 2018 is unchanged showing stable 1.2 cm nodular tissue ithin the right lobectomy bed, unchanged since December 2016.   September 07, 2018: Thyrogen  stimulated whole-body scan negative for any iodine avid metastatic  thyroid  cancer. -See his previous visit notes. - surveillance thyroid /neck ultrasound on August 20, 2019 which revealed stable 1.2 cm thyroid  remnant tissue on the right thyroid  bed.  No additional or new findings.   His thyroid /neck ultrasound on April 03, 2021 showed surgical changes of total thyroidectomy, no measurable residual thyroid  tissue identified on the current study.  His most recent thyroid  ultrasound on March 08, 2022 showed 17 mm x 6 mm x 8 mm tissue within the right surgical thyroid  gait, not identified on prior ultrasound.  Due to patient's history of contrast allergy, previsit CT was done without contrast showing no definite CT correlate for soft tissue was seen.  Nonvisualization may be due to a combination of small size and noncontrast enhanced CT technique.   There was no evidence of nodal or distant metastatic disease.    His previsit labs from June 19, 2023 showed thyroglobulin 0.1 associated with undetectable thyroglobulin antibodies. His last thyroglobulin level was from November 20, 2023 but it was still undetectable.  He denies any dysphagia, shortness of breath, nor voice change. He is currently on levothyroxine  175 mcg p.o. daily for postsurgical hypothyroidism.  His previsit labs are consistent with   For his uncontrolled type 2 diabetes, for which he is on multiple daily injections of insulin  along with metformin .  He presents with slightly improved profile with point-of-care A1c of 8.5% improving from 9.4%.  His AGP shows 56% time range, 34% level 1 hyperglycemia, 10% limited to hyperglycemia.  He has no hypoglycemia.  He is decided to come off of metformin .  He is currently on Toujeo  and     His recent labs show worsening her lipid panel.  His insurance did not provide coverage for Repatha , patient continues to avoid statins due to side effects.  He also is not consistent keeping his elbows advised to come off of fenofibrate by his nephrologist.    Review of  Systems [employer] as above.   Objective:    BP 132/74   Pulse 64   Ht 5' 7 (1.702 m)   Wt 195 lb 9.6 oz (88.7 kg)   BMI 30.64 kg/m   Wt Readings from Last 3 Encounters:  02/10/24 195 lb 9.6 oz (88.7 kg)  07/23/23 203 lb  12.8 oz (92.4 kg)  03/19/23 202 lb (91.6 kg)    Physical Exam          Latest Ref Rng & Units 11/20/2023   11:01 AM 03/12/2023    9:11 AM 09/10/2022   12:05 PM  CMP  Glucose 70 - 99 mg/dL 784  785  783   BUN 8 - 27 mg/dL 21  21  17    Creatinine 0.76 - 1.27 mg/dL 8.86  8.69  8.89   Sodium 134 - 144 mmol/L 139  140  140   Potassium 3.5 - 5.2 mmol/L 4.3  3.8  4.2   Chloride 96 - 106 mmol/L 100  101  101   CO2 20 - 29 mmol/L 19  22  22    Calcium  8.6 - 10.2 mg/dL 9.7  9.6  9.9   Total Protein 6.0 - 8.5 g/dL 7.4  7.0  7.2   Total Bilirubin 0.0 - 1.2 mg/dL 0.5  0.3  0.5   Alkaline Phos 44 - 121 IU/L 105  97  95   AST 0 - 40 IU/L 19  13  17    ALT 0 - 44 IU/L 26  21  25     Recent Results (from the past 2160 hours)  Lipid panel     Status: Abnormal   Collection Time: 11/20/23 11:01 AM  Result Value Ref Range   Cholesterol, Total 221 (H) 100 - 199 mg/dL   Triglycerides 643 (H) 0 - 149 mg/dL   HDL 35 (L) >60 mg/dL   VLDL Cholesterol Cal 63 (H) 5 - 40 mg/dL   LDL Chol Calc (NIH) 876 (H) 0 - 99 mg/dL   Chol/HDL Ratio 6.3 (H) 0.0 - 5.0 ratio    Comment:                                   T. Chol/HDL Ratio                                             Men  Women                               1/2 Avg.Risk  3.4    3.3                                   Avg.Risk  5.0    4.4                                2X Avg.Risk  9.6    7.1                                3X Avg.Risk 23.4   11.0   T4, free     Status: None   Collection Time: 11/20/23 11:01 AM  Result Value Ref Range   Free T4 1.17 0.82 - 1.77 ng/dL  TSH     Status: Abnormal   Collection Time: 11/20/23 11:01 AM  Result Value Ref Range   TSH 5.930 (H) 0.450 - 4.500 uIU/mL  Thyroglobulin  antibody     Status:  None   Collection Time: 11/20/23 11:01 AM  Result Value Ref Range   Thyroglobulin Antibody <1.0 0.0 - 0.9 IU/mL    Comment: Thyroglobulin Antibody measured by [employer] Methodology It should be noted that the presence of thyroglobulin antibodies may not be pathogenic nor diagnostic, especially at very low levels. The assay manufacturer has found that four percent of individuals without evidence of thyroid  disease or autoimmunity will have positive TgAb levels up to 4 IU/mL.   Thyroglobulin Level     Status: None   Collection Time: 11/20/23 11:01 AM  Result Value Ref Range   Thyroglobulin (TG-RIA) <2.0 ng/mL    Comment: This test was developed and its performance characteristics determined by [facility]. It has not been cleared or approved by the Food and Drug Administration. Reference Range: Pubertal Children and Adults: <40 According to the [facility] of Clinical Biochemistry, the reference interval for Thyroglobulin (TG) should be related to euthyroid patients and not for patients who underwent thyroidectomy.  TG reference intervals for these patients depend on the residual mass of the thyroid  tissue left after surgery.  Establishing a post-operative baseline is recommended.  The assay quantitation limit is 2.0 ng/mL.   Comprehensive metabolic panel     Status: Abnormal   Collection Time: 11/20/23 11:01 AM  Result Value Ref Range   Glucose 215 (H) 70 - 99 mg/dL   BUN 21 8 - 27 mg/dL   Creatinine, Ser 8.86 0.76 - 1.27 mg/dL   eGFR 69 >40 fO/fpw/8.26   BUN/Creatinine Ratio 19 10 - 24   Sodium 139 134 - 144 mmol/L   Potassium 4.3 3.5 - 5.2 mmol/L   Chloride 100 96 - 106 mmol/L   CO2 19 (L) 20 - 29 mmol/L   Calcium  9.7 8.6 - 10.2 mg/dL   Total Protein 7.4 6.0 - 8.5 g/dL   Albumin 4.4 3.8 - 4.8 g/dL   Globulin, Total 3.0 1.5 - 4.5 g/dL   Bilirubin Total 0.5 0.0 - 1.2 mg/dL   Alkaline Phosphatase 105 44 - 121 IU/L   AST 19 0 - 40 IU/L   ALT 26 0 - 44 IU/L  TSH      Status: Abnormal   Collection Time: 02/06/24 12:04 PM  Result Value Ref Range   TSH 0.099 (L) 0.450 - 4.500 uIU/mL  T4, free     Status: Abnormal   Collection Time: 02/06/24 12:04 PM  Result Value Ref Range   Free T4 1.89 (H) 0.82 - 1.77 ng/dL  HgB J8r     Status: Abnormal   Collection Time: 02/10/24  9:44 AM  Result Value Ref Range   Hemoglobin A1C     HbA1c POC (<> result, manual entry)     HbA1c, POC (prediabetic range)     HbA1c, POC (controlled diabetic range) 8.5 (A) 0.0 - 7.0 %   Lipid Panel     Component Value Date/Time   CHOL 221 (H) 11/20/2023 1101   TRIG 356 (H) 11/20/2023 1101   HDL 35 (L) 11/20/2023 1101   CHOLHDL 6.3 (H) 11/20/2023 1101   LDLCALC 123 (H) 11/20/2023 1101   LABVLDL 63 (H) 11/20/2023 1101   0.1   Thyroglobulin Antibody Screen Negative Negative  Thyroglobulin Antibody <4.0 IU/mL <0.9     Thyrogen  stimulated whole-body scan on 06/21/2016: IMPRESSION: Negative whole-body I 131 scan for recurrent iodine avid thyroid  Cancer.  06/14/2016 thyroglobulin antibodies negative, thyroglobulin by IME less than 0.1  06/27/2017 -  His previsit ultrasound at an outside facility in [facility] revealed an echogenic focus measuring 1.7cm x 0.7cm x 1.1 cm ( previously 1 cm-1.2 cm in [facility] imaging on 02/01/2016) potentially representing a small amount of residual thyroid  tissue versus small lymph node. There is no identifiable tissue on the left, no identifiable isthmus.  August 08, 2018 thyroid /neck ultrasound in [facility]:   FINDINGS: Residual solid nodular isoechoic tissue in the right thyroid  noted measures 1.2 x 1.0 x 1.0 cm, previously 1.0 x 0.7 x 0.8 cm. No significant interval change. No new abnormality.  Similar ultrasound findings on February 02, 2018. See above.  September 07, 2018 thyroid  stimulated whole-body scan negative for iodine avid distant metastasis.  Thyroglobulin by IMA on October 08, 2018  undetectable.  Thyroid /neck surveillance ultrasound August 20, 2019 IMPRESSION: Stable soft tissue in the right thyroidectomy bed. No significant change from the previous examination. No other evidence for recurrent or residual thyroid  tissue.  Thyroid  ultrasound on March 08, 2022 IMPRESSION: Ultrasound survey of the neck demonstrates tissue within the right surgical thyroid  bed measuring 17 mm x 6 mm x 8 mm, not identified on the prior ultrasound. Recurrent tissue growth not excluded, and correlation with thyroid  lab values may be useful, as well as consideration of imaging such as contrast-enhanced neck CT and/or nuclear medicine study.   CT scan soft tissue neck without contrast on September 03, 2022  IMPRESSION: 1. Status post total thyroidectomy. No definite CT correlate is visualized for soft tissue seen on thyroid  ultrasound dated 03/08/2022. Non visualization may be due to a combination of small size and noncontrast enhanced CT technique. 2. No evidence of nodal or distant metastatic disease.    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 on July 17, 2012, thyrogen  stimulated RAI thyroid  remnant ablation given his BRAF positive presentation.  The post therapy scan was negative for distant metastasis. His May 2014 thyroid  ultrasound is negative for remnant tissue.  Subsequent surveillance thyrogen  stimulated WBS on 10/25/13 was c/w successful ablation of thyroid  remnants, with no evidence of local recurrence nor metastatic disease.  His repeat thyroid  ultrasound on 10/11/14 showed no residual thyroid  tissue. However, on his last surveillance thyroid /neck ultrasound she was found to have a new 9mm residual/recurrent soft tissue nodule in the right  thyroidectomy bed. On subsequent surveillance thyroid  ultrasound this tissue was  found to be larger at 12 mm. Attempt to biopsy this up to [facility] was not diagnostic.   - On thyroid /neck ultrasound before this visit on 02/01/2016 showed that the remnant thyroid  tissue decreased in size to 1.0 cm, all other  findings unchanged.  There was no sign of residual thyroid  tissue on whole-body scan performed 2015.  She is thyroglobulin by IMA was less than 0.1 associated with negative thyroglobulin antibodies on 06/14/2016.  - Thyrogen  stimulated whole-body scan on 06/21/2016 was negative for iodine avid metastatic thyroid  cancer.   - thyroid /neck ultrasound on 06/27/2017 showed persistence of possibly thyroid  tissue in the right thyroid  lobe bed- this was done within a different facility than his prior imaging studies-subject to variations of measurements.  -  thyroid /neck ultrasound in [facility] in June 2019- shows stable finding of 1.2 cm nodular area within the right lobectomy bed. No intervention at this time. His  whole-body scan  after Thyrogen  stimulation was negative for distant metastasis on September 07, 2018.    Surveillance thyroid /neck ultrasound on January 2021 Stable soft tissue in the right thyroidectomy bed. No significant change from the previous examination. No other evidence for recurrent or residual thyroid  tissue. -His August 2022  thyroid /neck ultrasound was unremarkable.  He is currently 9 years out from his original surgery.    Previsit thyroid  ultrasound on March 08, 2022 showed that his previously documented tissue on the right thyroid  bed is still present measuring 17 mm.  Per radiology recommendation, he underwent CT soft tissue/neck without contrast due to his history of reaction to contrast .  There was no CT correlate for the area of concern, no evidence of distant metastasis. -His previsit labs show thyroglobulin of 0.1, thyroglobulin less than 0.9. He will be continued on expectant management at this time. He continues to have no evidence  of structural or biochemical recurrence or relapse giving him excellent response to treatment.  2. Postsurgical hypothyroidism -His previsit labs are consistent with over replacement.  I discussed and lowered levothyroxine  to 150 mcg p.o. daily before breakfast.     - We discussed about the correct intake of his thyroid  hormone, on empty stomach at fasting, with water , separated by at least 30 minutes from breakfast and other medications,  and separated by more than 4 hours from calcium , iron, multivitamins, acid reflux medications (PPIs). -Patient is made aware of the fact that thyroid  hormone replacement is needed for life, dose to be adjusted by periodic monitoring of thyroid  function tests.    3. Uncontrolled type 2 diabetes mellitus with complication, with long-term current use of insulin  (HCC) - His engagement with diabetes management is suboptimal.  His CGM device shows 56% time in range, 34% level 1 hyperglycemia, 10% level 2 hyperglycemia.   His point-of-care A1c is 8.5% improving from 9.4%.    He will continue to need intensive treatment with basal/bolus insulin .  He is advised to continue Toujeo  100 units nightly, continue Humalog  14-20 units 3 times daily AC for Premeal blood glucose readings above 90 mg per DL. -His insurance did not provide coverage for Jardiance .   - He is 11 years out from his thyroid  cancer diagnosis and treatment.  He will be considered for low-dose GLP-1 receptor agonist.  I discussed risks, side effects, and potential benefits of this medication with him.  He agrees with my plan to start Mounjaro 2.5 mg subcutaneously weekly.  This medication will be advanced as he tolerates.    He wishes to stay away from metformin .   He has severe hypertriglyceridemia, putting him at risk for pancreatitis.  See below.   - he acknowledges that there is a room for improvement in his food and drink choices. - Suggestion is made for him to avoid simple carbohydrates  from his  diet including Cakes, Sweet Desserts, Ice Cream, Soda (diet and regular), Sweet Tea, Candies, Chips, Cookies, Store Bought Juices, Alcohol , Artificial Sweeteners,  Coffee Creamer, and Sugar-free Products, Lemonade. This will help patient to have more stable blood glucose profile and potentially avoid unintended weight gain.  The following Lifestyle Medicine recommendations according to American College of Lifestyle Medicine  [facility]) were discussed and and offered to patient and he  agrees to start the journey:  A. Whole Foods, Plant-Based Nutrition comprising of fruits and vegetables, plant-based proteins, whole-grain carbohydrates was discussed in detail with the patient.   A list for source of those nutrients were also provided  to the patient.  Patient will use only water  or unsweetened tea for hydration. B.  The need to stay away from risky substances including alcohol, smoking; obtaining 7 to 9 hours of restorative sleep, at least 150 minutes of moderate intensity exercise weekly, the importance of healthy social connections,  and stress management techniques were discussed. C.  A full color page of  Calorie density of various food groups per pound showing examples of each food groups was provided to the patient.     4. Hyperlipidemia:  His lipid panel is now consistent with severe dyslipidemia with LDL at 123, Leiser is 356, cholesterol 221, HDL 35.   He is off of fenofibrate due to renal concerns.  Did not afford Vascepa , Repatha .  His insurance did not provide coverage.  He does not tolerate statins.  Discussed and initiated Zetia p.o. nightly.  whole food plant-based diet will address his dyslipidemia, discussed and recommended doing 1 more time.    5. Essential hypertension: His blood pressure is controlled to target.  He is advised to continue on his current medications including lisinopril /HCTZ 20/12.5 mg p.o. daily at breakfast.    His diabetic foot exam is normal today on February 10, 2024  I advised patient to maintain close follow up with his PCP for primary care needs.    I spent  41  minutes in the care of the patient today including review of labs from CMP, Lipids, Thyroid  Function, Hematology (current and previous including abstractions from other facilities); face-to-face time discussing  his blood glucose readings/logs, discussing hypoglycemia and hyperglycemia episodes and symptoms, medications doses, his options of short and long term treatment based on the latest standards of care / guidelines;  discussion about incorporating lifestyle medicine;  and documenting the encounter. Risk reduction counseling performed per USPSTF guidelines to reduce  obesity and cardiovascular risk factors.     Please refer to Patient Instructions for Blood Glucose Monitoring and Insulin /Medications Dosing Guide  in media tab for additional information. Please  also refer to  Patient Self Inventory in the Media  tab for reviewed elements of pertinent patient history.  Dean Hurley participated in the discussions, expressed understanding, and voiced agreement with the above plans.  All questions were answered to his satisfaction. he is encouraged to contact clinic should he have any questions or concerns prior to his return visit.    Follow up plan: Return in about 4 months (around 06/12/2024) for F/U with Pre-visit Labs, Meter/CGM/Logs, A1c here.  Dean Earl, MD Phone: [phone]  Fax: [phone]   This note was partially dictated with voice recognition software. Similar sounding words can be transcribed inadequately or may not  be corrected upon review.  02/10/2024, 11:09 AM

## 2024-02-10 NOTE — Telephone Encounter (Signed)
 Pharmacy Patient Advocate Encounter   Received notification from CoverMyMeds that prior authorization for Mounjaro 2.5MG /0.5ML auto-injectors is required/requested.   Insurance verification completed.   The patient is insured through [employer] .   Per test claim: PA required; PA submitted to above mentioned insurance via CoverMyMeds Key/confirmation #/EOC [id number] Status is pending

## 2024-03-16 ENCOUNTER — Other Ambulatory Visit: Payer: Self-pay | Admitting: "Endocrinology

## 2024-03-16 DIAGNOSIS — E1169 Type 2 diabetes mellitus with other specified complication: Secondary | ICD-10-CM

## 2024-05-28 ENCOUNTER — Other Ambulatory Visit: Payer: Self-pay | Admitting: "Endocrinology

## 2024-05-28 DIAGNOSIS — Z91199 Patient's noncompliance with other medical treatment and regimen due to unspecified reason: Secondary | ICD-10-CM

## 2024-06-12 ENCOUNTER — Other Ambulatory Visit: Payer: Self-pay | Admitting: "Endocrinology

## 2024-06-12 DIAGNOSIS — Z794 Long term (current) use of insulin: Secondary | ICD-10-CM

## 2024-06-14 LAB — LAB REPORT - SCANNED
EGFR: 78
Free T4: 1.17 ng/dL
TSH: 6.86

## 2024-06-21 ENCOUNTER — Encounter: Payer: Self-pay | Admitting: "Endocrinology

## 2024-06-21 ENCOUNTER — Ambulatory Visit: Admitting: "Endocrinology

## 2024-06-21 VITALS — BP 132/60 | HR 60 | Ht 67.0 in | Wt 204.6 lb

## 2024-06-21 DIAGNOSIS — E1169 Type 2 diabetes mellitus with other specified complication: Secondary | ICD-10-CM

## 2024-06-21 DIAGNOSIS — Z794 Long term (current) use of insulin: Secondary | ICD-10-CM | POA: Diagnosis not present

## 2024-06-21 DIAGNOSIS — E782 Mixed hyperlipidemia: Secondary | ICD-10-CM

## 2024-06-21 DIAGNOSIS — E890 Postprocedural hypothyroidism: Secondary | ICD-10-CM

## 2024-06-21 DIAGNOSIS — Z85850 Personal history of malignant neoplasm of thyroid: Secondary | ICD-10-CM | POA: Diagnosis not present

## 2024-06-21 DIAGNOSIS — I10 Essential (primary) hypertension: Secondary | ICD-10-CM

## 2024-06-21 DIAGNOSIS — Z789 Other specified health status: Secondary | ICD-10-CM

## 2024-06-21 LAB — POCT GLYCOSYLATED HEMOGLOBIN (HGB A1C): HbA1c, POC (controlled diabetic range): 8.9 % — AB (ref 0.0–7.0)

## 2024-06-21 MED ORDER — LEVOTHYROXINE SODIUM 175 MCG PO TABS: ORAL_TABLET | ORAL | 1 refills | Status: AC

## 2024-06-21 MED ORDER — EMPAGLIFLOZIN 10 MG PO TABS: 10.0000 mg | ORAL_TABLET | Freq: Every day | ORAL | 1 refills | Status: AC

## 2024-06-21 NOTE — Patient Instructions (Signed)

## 2024-06-21 NOTE — Progress Notes (Signed)
 06/21/2024  Endocrinology follow-up note   Subjective:    Patient ID: Dean Hurley, male    DOB: 06/03/1951,   Chief complaint  Patient returns to clinic for follow-up of the following conditions: 1) History of thyroid  malignancy 2) postsurgical hypothyroidism 3)  Uncontrolled type 2 diabetes  4) hyperlipidemia 5) hypertension   Past Medical History:  Diagnosis Date   Cancer (HCC)    Diabetes mellitus without complication (HCC)    Hypertension    Thyroid  cancer (HCC)    Past Surgical History:  Procedure Laterality Date   BACK SURGERY     BLADDER SURGERY  2024   THYROID  SURGERY     Thyroidectomy   TRANSURETHRAL RESECTION OF PROSTATE     Social History   Socioeconomic History   Marital status: Married    Spouse name: Not on file   Number of children: Not on file   Years of education: Not on file   Highest education level: Not on file  Occupational History   Not on file  Tobacco Use   Smoking status: Former    Types: Cigarettes   Smokeless tobacco: Never  Vaping Use   Vaping status: Never Used  Substance and Sexual Activity   Alcohol use: No    Alcohol/week: 0.0 standard drinks of alcohol   Drug use: No   Sexual activity: Not on file  Other Topics Concern   Not on file  Social History Narrative   Not on file   Social Drivers of Health   Financial Resource Strain: Not on file  Food Insecurity: No Food Insecurity (12/26/2023)   [facility]   Hunger Vital Sign    Within the past 12 months, you worried that your food would run out before you got the money to buy more.: Never true    Within the past 12 months, the food you bought just didn't last and you didn't have money to get more.: Never true  Transportation Needs: No Transportation Needs (08/11/2023)   Received from [facility] - Transportation    In the past 12 months, has lack of transportation kept you from medical appointments or  from getting medications?: No    Lack of Transportation (Non-Medical): No  Physical Activity: Sufficiently Active (12/26/2023)   Received from [facility]   Exercise Vital Sign    On average, how many days per week do you engage in moderate to strenuous exercise (like a brisk walk)?: 3 days    On average, how many minutes do you engage in exercise at this level?: 120 min  Stress: No Stress Concern Present (12/26/2023)   Received from [facility] - Occupational Stress Questionnaire    Feeling of Stress : Not at all  Social Connections: Moderately Integrated (12/26/2023)   Received from [facility]   Social Connection and Isolation Panel    In a typical week, how many times do you talk on the phone with family, friends, or neighbors?: More than three times a week    How often do you get together with friends or relatives?: More than three times a week    How often do you attend church or religious services?: 1 to 4 times per year    Do you belong to any clubs or organizations such as church groups, unions, fraternal or athletic  groups, or school groups?: No    How often do you attend meetings of the clubs or organizations you belong to?: Never    Are you married, widowed, divorced, separated, never married, or living with a partner?: Married   Outpatient Encounter Medications as of 06/21/2024  Medication Sig   empagliflozin  (JARDIANCE ) 10 MG TABS tablet Take 1 tablet (10 mg total) by mouth daily before breakfast.   alum & mag hydroxide-simeth (MAALOX/MYLANTA) 200-200-20 MG/5ML suspension Take 20 mLs by mouth every 6 (six) hours as needed for heartburn.   B-D ULTRAFINE III SHORT PEN 31G X 8 MM MISC USE WITH INSULIN  FOUR TIMES A DAY   Coenzyme Q10 (COQ10 PO) Take 1 tablet by mouth daily in the afternoon.   Continuous Blood Gluc Receiver (FREESTYLE LIBRE 2 READER) DEVI Use as directed to check glucose   Continuous Blood Gluc Receiver  (FREESTYLE LIBRE 2 READER) DEVI As directed   ezetimibe  (ZETIA ) 10 MG tablet Take 1 tablet (10 mg total) by mouth at bedtime.   icosapent  Ethyl (VASCEPA ) 1 g capsule Take 2 capsules (2 g total) by mouth 2 (two) times daily.   insulin  lispro (HUMALOG ) 100 UNIT/ML KwikPen INJECT 14 TO 20 UNITS SUBCUTANEOUSLY THREE TIMES DAILY WITH MEALS   IRON, FERROUS SULFATE, PO Take 1 tablet by mouth daily in the afternoon.   levothyroxine  (SYNTHROID ) 175 MCG tablet TAKE 1 TABLET BY MOUTH ONCE DAILY BEFORE BREAKFAST   losartan-hydrochlorothiazide  (HYZAAR) 50-12.5 MG tablet Take 1 tablet by mouth daily.   meclizine  (ANTIVERT ) 25 MG tablet Take 25 mg by mouth 3 (three) times daily as needed for dizziness.   OVER THE COUNTER MEDICATION Prostagenic 3 tablets po daily   tirzepatide  (MOUNJARO ) 2.5 MG/0.5ML Pen Inject 2.5 mg into the skin once a week. (Patient not taking: Reported on 06/21/2024)   TOUJEO  MAX SOLOSTAR 300 UNIT/ML Solostar Pen INJECT 100 UNITS SUBCUTANEOUSLY AT BEDTIME   triamcinolone  cream (KENALOG ) 0.1 % Apply 1 application topically daily as needed (for scaly skin).    [DISCONTINUED] levothyroxine  (SYNTHROID ) 150 MCG tablet TAKE 1 TABLET BY MOUTH ONCE DAILY BEFORE BREAKFAST   No facility-administered encounter medications on file as of 06/21/2024.   ALLERGIES: Allergies  Allergen Reactions   Codeine Other (See Comments)    Feels faint, flushed almost passed out almost passed out    Contrast Media [Iodinated Contrast Media]    VACCINATION STATUS: Immunization History  Administered Date(s) Administered   Moderna Sars-Covid-2 Vaccination 10/26/2019, 11/23/2019, 06/28/2020, 02/05/2021    Diabetes    73 year old male with multiple medical problems. He underwent total thyroidectomy in [facility] on June 02, 2012.  His total thyroidectomy showed BRAF positive multicentric thyroid  cancer. 1.7 cm cancer nodule on the right lobe and 0.4 cm cancer nodule on the left lobe.  Partial invasion of capsule, margins negative. He underwent thyrogen  stimulated I131  thyroid  remnant ablation with negative whole body scan for distant metastasis on July 17, 2013.    May 2014 thyroid  /neck ultrasound :  negative for any mass lesion.   Thyrogen  stimulated WBS on 10/25/13 was c/w successful ablation of thyroid  remnants with no evidence of local recurrence or metastatic disease. He has  had another thyroid  ultrasound on 10/11/14 which was negative for residual thyroid  tissue. On his next  surveillance thyroid  ultrasound on September 6,2016 however he has had a 9 mm residual tissue on the  right thyroid  bed, which grew to 12 mm in follow-up ultrasound in December 2016. He was sent to  [facility] where he had his prior surgery. Fine needle  aspiration of the residual tissue did not reveal diagnostic material.  - On thyroid /neck ultrasound  on 02/01/2016 showed that the remnant thyroid  tissue decreased in size to 1.0 cm, all other  findings unchanged.  - His last  Thyrogen  stimulated whole-body scan on  06/21/2016 was  reported to be negative for any iodine avid metastatic thyroid  cancer.  - On 06/27/2017 : His  ultrasound at an outside facility in [facility] revealed an echogenic focus measuring 1.7cm x 0.7cm x 1.1 cm ( previously 1 cm-1.2 cm in [facility] imaging on 02/01/2016) potentially representing a small amount of residual thyroid  tissue versus small lymph node. There is no identifiable tissue on the left, no identifiable isthmus.  -He pushed to get his previsit ultrasound prematurely on August 08, 2017 which showed no significant change in the thyroid  remnant at 1.2 cm.  -His most recent  repeat surveillance thyroid /neck ultrasound on February 02, 2018 is unchanged showing stable 1.2 cm nodular tissue ithin the right lobectomy bed, unchanged since December 2016.   September 07, 2018: Thyrogen  stimulated whole-body scan negative for any iodine avid metastatic  thyroid  cancer. -See his previous visit notes. - surveillance thyroid /neck ultrasound on August 20, 2019 which revealed stable 1.2 cm thyroid  remnant tissue on the right thyroid  bed.  No additional or new findings.   His thyroid /neck ultrasound on April 03, 2021 showed surgical changes of total thyroidectomy, no measurable residual thyroid  tissue identified on the current study.  His most recent thyroid  ultrasound on March 08, 2022 showed 17 mm x 6 mm x 8 mm tissue within the right surgical thyroid  gait, not identified on prior ultrasound.  Due to patient's history of contrast allergy, previsit CT was done without contrast showing no definite CT correlate for soft tissue was seen.  Nonvisualization may be due to a combination of small size and noncontrast enhanced CT technique.   There was no evidence of nodal or distant metastatic disease.    His previsit labs from June 19, 2023 showed thyroglobulin 0.1 associated with undetectable thyroglobulin antibodies. His last thyroglobulin level was from November 20, 2023 but it was still undetectable.  He denies dysphagia, shortness of breath, nor voice change.  He is currently on levothyroxine  150 mcg p.o. daily before breakfast.     For his uncontrolled type 2 diabetes, for which he is on multiple daily injections of insulin  along with metformin .  He presents with slightly improved profile with point-of-care A1c of 8.9% increasing from 8.5% .  His AGP shows 67% time in range, 23% level 1 hyperglycemia, 10% level 2 hyperglycemia.  He has no hypoglycemia.  He is decided to come off of metformin  and did not start Ozempic prescribed for him hesitating due to his history of thyroid  cancer even so I have assured him that he is a good candidate for this intervention.  He is currently on Toujeo  and Humalog .   His recent labs show worsening her lipid panel.  His insurance did not provide coverage for Repatha , patient continues to avoid statins due to side  effects, also Zetia  which he says did not tolerate.  He did not engage with the lifestyle medicine nutrition prescribed for him.   Review of Systems Limited as above.   Objective:    BP 132/60   Pulse 60   Ht 5' 7 (1.702 m)   Wt 204 lb 9.6 oz (92.8 kg)   BMI 32.04 kg/m  Wt Readings from Last 3 Encounters:  06/21/24 204 lb 9.6 oz (92.8 kg)  02/10/24 195 lb 9.6 oz (88.7 kg)  07/23/23 203 lb 12.8 oz (92.4 kg)    Physical Exam          Latest Ref Rng & Units 11/20/2023   11:01 AM 03/12/2023    9:11 AM 09/10/2022   12:05 PM  CMP  Glucose 70 - 99 mg/dL 784  785  783   BUN 8 - 27 mg/dL 21  21  17    Creatinine 0.76 - 1.27 mg/dL 8.86  8.69  8.89   Sodium 134 - 144 mmol/L 139  140  140   Potassium 3.5 - 5.2 mmol/L 4.3  3.8  4.2   Chloride 96 - 106 mmol/L 100  101  101   CO2 20 - 29 mmol/L 19  22  22    Calcium  8.6 - 10.2 mg/dL 9.7  9.6  9.9   Total Protein 6.0 - 8.5 g/dL 7.4  7.0  7.2   Total Bilirubin 0.0 - 1.2 mg/dL 0.5  0.3  0.5   Alkaline Phos 44 - 121 IU/L 105  97  95   AST 0 - 40 IU/L 19  13  17    ALT 0 - 44 IU/L 26  21  25     Recent Results (from the past 2160 hours)  Lab report - scanned     Status: None   Collection Time: 06/14/24 10:50 AM  Result Value Ref Range   EGFR 78.0     Comment: abst by him   Free T4 1.17 ng/dL   TSH 3.13   HgB J8r     Status: Abnormal   Collection Time: 06/21/24  8:40 AM  Result Value Ref Range   Hemoglobin A1C     HbA1c POC (<> result, manual entry)     HbA1c, POC (prediabetic range)     HbA1c, POC (controlled diabetic range) 8.9 (A) 0.0 - 7.0 %   Lipid Panel     Component Value Date/Time   CHOL 221 (H) 11/20/2023 1101   TRIG 356 (H) 11/20/2023 1101   HDL 35 (L) 11/20/2023 1101   CHOLHDL 6.3 (H) 11/20/2023 1101   LDLCALC 123 (H) 11/20/2023 1101   LABVLDL 63 (H) 11/20/2023 1101   0.1   Thyroglobulin Antibody Screen Negative Negative  Thyroglobulin Antibody <4.0 IU/mL <0.9     Thyrogen  stimulated whole-body scan on  06/21/2016: IMPRESSION: Negative whole-body I 131 scan for recurrent iodine avid thyroid  Cancer.  06/14/2016 thyroglobulin antibodies negative, thyroglobulin by IME less than 0.1  06/27/2017 - His previsit ultrasound at an outside facility in [facility] revealed an echogenic focus measuring 1.7cm x 0.7cm x 1.1 cm ( previously 1 cm-1.2 cm in [facility] imaging on 02/01/2016) potentially representing a small amount of residual thyroid  tissue versus small lymph node. There is no identifiable tissue on the left, no identifiable isthmus.  August 08, 2018 thyroid /neck ultrasound in [facility]:   FINDINGS: Residual solid nodular isoechoic tissue in the right thyroid  noted measures 1.2 x 1.0 x 1.0 cm, previously 1.0 x 0.7 x 0.8 cm. No significant interval change. No new abnormality.  Similar ultrasound findings on February 02, 2018. See above.  September 07, 2018 thyroid  stimulated whole-body scan negative for iodine avid distant metastasis.  Thyroglobulin by IMA on October 08, 2018 undetectable.  Thyroid /neck surveillance ultrasound August 20, 2019 IMPRESSION: Stable soft tissue in the right thyroidectomy bed. No significant  change from the previous examination. No other evidence for recurrent or residual thyroid  tissue.  Thyroid  ultrasound on March 08, 2022 IMPRESSION: Ultrasound survey of the neck demonstrates tissue within the right surgical thyroid  bed measuring 17 mm x 6 mm x 8 mm, not identified on the prior ultrasound. Recurrent tissue growth not excluded, and correlation with thyroid  lab values may be useful, as well as consideration of imaging such as contrast-enhanced neck CT and/or nuclear medicine study.   CT scan soft tissue neck without contrast on September 03, 2022  IMPRESSION: 1. Status post total thyroidectomy. No definite CT correlate is visualized for soft tissue seen on thyroid  ultrasound dated 03/08/2022. Non visualization may be  due to a combination of small size and noncontrast enhanced CT technique. 2. No evidence of nodal or distant metastatic disease.    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 on July 17, 2012, thyrogen  stimulated RAI thyroid  remnant ablation given his BRAF positive presentation.  The post therapy scan was negative for distant metastasis. His May 2014 thyroid  ultrasound is negative for remnant tissue.  Subsequent surveillance thyrogen  stimulated WBS on 10/25/13 was c/w successful ablation of thyroid  remnants, with no evidence of local recurrence nor metastatic disease.  His repeat thyroid  ultrasound on 10/11/14 showed no residual thyroid  tissue. However, on his last surveillance thyroid /neck ultrasound she was found to have a new 9mm residual/recurrent soft tissue nodule in the right  thyroidectomy bed. On subsequent surveillance thyroid  ultrasound this tissue was found to be larger at 12 mm. Attempt to biopsy this up to [facility] was not diagnostic.   - On thyroid /neck ultrasound before this visit on 02/01/2016 showed that the remnant thyroid  tissue decreased in size to 1.0 cm, all other  findings unchanged.  There was no sign of residual thyroid  tissue on whole-body scan performed 2015.  She is thyroglobulin by IMA was less than 0.1 associated with negative thyroglobulin antibodies on 06/14/2016.  - Thyrogen  stimulated whole-body scan on 06/21/2016 was negative for iodine avid metastatic thyroid  cancer.   - thyroid /neck ultrasound on 06/27/2017 showed persistence of possibly thyroid  tissue in the right thyroid  lobe bed- this was done within a different facility than his prior imaging studies-subject to variations of measurements.  -  thyroid /neck ultrasound in [facility] in June 2019- shows stable finding of 1.2 cm nodular area within the  right lobectomy bed. No intervention at this time. His  whole-body scan after Thyrogen  stimulation was negative for distant metastasis on September 07, 2018.    Surveillance thyroid /neck ultrasound on January 2021 Stable soft tissue in the right thyroidectomy bed. No significant change from the previous examination. No other evidence for recurrent or residual thyroid  tissue. -His August 2022  thyroid /neck ultrasound was unremarkable.  He is currently 9 years out from his original surgery.    Previsit thyroid  ultrasound on March 08, 2022 showed that his previously documented tissue on the right thyroid  bed is still present measuring 17 mm.  Per radiology recommendation, he underwent CT soft tissue/neck without contrast due to his history of reaction to contrast .  There was no CT correlate for the area of concern, no evidence of distant metastasis. -His previsit labs show thyroglobulin of  <0.1, thyroglobulin  abs <2.0. He will be continued on expectant management at this time. He continues to have no  evidence of structural or biochemical recurrence or relapse giving him excellent response to treatment.    2. Postsurgical hypothyroidism -His previsit labs are consistent with under replacement.  I discussed and increase his levothyroxine  to 175 mcg p.o. daily before breakfast.     - We discussed about the correct intake of his thyroid  hormone, on empty stomach at fasting, with water , separated by at least 30 minutes from breakfast and other medications,  and separated by more than 4 hours from calcium , iron, multivitamins, acid reflux medications (PPIs). -Patient is made aware of the fact that thyroid  hormone replacement is needed for life, dose to be adjusted by periodic monitoring of thyroid  function tests.   3. Uncontrolled type 2 diabetes mellitus with complication He presents with slightly improved profile with point-of-care A1c of 8.9% increasing from 8.5% .  His AGP shows 67% time in  range, 23% level 1 hyperglycemia, 10% level 2 hyperglycemia.   -he will continue to need intensive treatment with basal/bolus insulin .  He is advised to continue Toujeo  100 units nightly, continue Humalog   Humalog  14-20 units 3 times daily AC for Premeal blood glucose readings above 90 mg per DL. - On a previous attempt, his insurance did not provide coverage for Jardiance .  I discussed and prescribed this medication again 10 mg p.o. daily at breakfast.  He will benefit from this medication from diabetes, cardiorenal protection point of view.  - He is 11 years out from his thyroid  cancer diagnosis and treatment.  He would have benefited from intervention with GLP-1 receptor agonist, however patient is hesitant.    He wishes to stay away from metformin .   He has severe hypertriglyceridemia, putting him at risk for pancreatitis.  See below.    - he acknowledges that there is a room for improvement in his food and drink choices. - Suggestion is made for him to avoid simple carbohydrates  from his diet including Cakes, Sweet Desserts, Ice Cream, Soda (diet and regular), Sweet Tea, Candies, Chips, Cookies, Store Bought Juices, Alcohol , Artificial Sweeteners,  Coffee Creamer, and Sugar-free Products, Lemonade. This will help patient to have more stable blood glucose profile and potentially avoid unintended weight gain.  The following Lifestyle Medicine recommendations according to American College of Lifestyle Medicine  [facility]) were discussed and and offered to patient and he  agrees to start the journey:  A. Whole Foods, Plant-Based Nutrition comprising of fruits and vegetables, plant-based proteins, whole-grain carbohydrates was discussed in detail with the patient.   A list for source of those nutrients were also provided to the patient.  Patient will use only water  or unsweetened tea for hydration. B.  The need to stay away from risky substances including alcohol, smoking; obtaining 7 to 9 hours of  restorative sleep, at least 150 minutes of moderate intensity exercise weekly, the importance of healthy social connections,  and stress management techniques were discussed. C.  A full color page of  Calorie density of various food groups per pound showing examples of each food groups was provided to the patient.   4. Hyperlipidemia:  His lipid panel is now consistent with severe dyslipidemia with LDL at 123, triglycerides  is 356, cholesterol 221, HDL 35.   He is off of fenofibrate due to renal concerns.  Did not afford Vascepa , Repatha .  His insurance did not provide coverage.  He does not tolerate statins.  He did not tolerate Zetia  either.  His only best option is plant-based diet which is discussed in  detail-see above.  He is encouraged to engage. Risks of uncontrolled lipids related to cardiovascular mortality and morbidity were discussed with him.   5. Essential hypertension: His blood pressure is controlled to target.  He is advised to continue on his current medications including lisinopril /HCTZ 20/12.5 mg p.o. daily at breakfast.    His diabetic foot exam is normal today on February 10, 2024  I advised patient to maintain close follow up with his PCP for primary care needs.   I spent  42  minutes in the care of the patient today including review of labs from CMP, Lipids, Thyroid  Function, Hematology (current and previous including abstractions from other facilities); face-to-face time discussing  his blood glucose readings/logs, discussing hypoglycemia and hyperglycemia episodes and symptoms, medications doses, his options of short and long term treatment based on the latest standards of care / guidelines;  discussion about incorporating lifestyle medicine;  and documenting the encounter. Risk reduction counseling performed per USPSTF guidelines to reduce  obesity and cardiovascular risk factors.     Please refer to Patient Instructions for Blood Glucose Monitoring and Insulin /Medications  Dosing Guide  in media tab for additional information. Please  also refer to  Patient Self Inventory in the Media  tab for reviewed elements of pertinent patient history.  Alm Bunker participated in the discussions, expressed understanding, and voiced agreement with the above plans.  All questions were answered to his satisfaction. he is encouraged to contact clinic should he have any questions or concerns prior to his return visit.     Follow up plan: Return in about 4 months (around 10/19/2024) for F/U with Pre-visit Labs, Meter/CGM/Logs, A1c here.  Ranny Earl, MD Phone: [phone]  Fax: [phone]   This note was partially dictated with voice recognition software. Similar sounding words can be transcribed inadequately or may not  be corrected upon review.  06/21/2024, 12:56 PM

## 2024-07-14 ENCOUNTER — Other Ambulatory Visit: Payer: Self-pay

## 2024-07-14 DIAGNOSIS — E1169 Type 2 diabetes mellitus with other specified complication: Secondary | ICD-10-CM

## 2024-07-14 MED ORDER — TOUJEO MAX SOLOSTAR 300 UNIT/ML ~~LOC~~ SOPN: 100.0000 [IU] | PEN_INJECTOR | Freq: Every day | SUBCUTANEOUS | 0 refills | Status: DC

## 2024-07-16 ENCOUNTER — Other Ambulatory Visit: Payer: Self-pay

## 2024-07-16 DIAGNOSIS — E1169 Type 2 diabetes mellitus with other specified complication: Secondary | ICD-10-CM

## 2024-07-16 MED ORDER — TOUJEO MAX SOLOSTAR 300 UNIT/ML ~~LOC~~ SOPN: 100.0000 [IU] | PEN_INJECTOR | Freq: Every day | SUBCUTANEOUS | 0 refills | Status: AC

## 2024-10-19 ENCOUNTER — Ambulatory Visit: Admitting: "Endocrinology
